# Patient Record
Sex: Female | Born: 1950 | Race: White | Hispanic: No | Marital: Married | Smoking: Never smoker
Health system: Southern US, Community
[De-identification: ages and names within clinical notes are randomized; demographics above are authoritative.]

## PROBLEM LIST (undated history)

## (undated) DIAGNOSIS — E669 Obesity, unspecified: Secondary | ICD-10-CM

## (undated) DIAGNOSIS — R739 Hyperglycemia, unspecified: Secondary | ICD-10-CM

## (undated) DIAGNOSIS — J309 Allergic rhinitis, unspecified: Secondary | ICD-10-CM

## (undated) DIAGNOSIS — E785 Hyperlipidemia, unspecified: Secondary | ICD-10-CM

## (undated) DIAGNOSIS — F329 Major depressive disorder, single episode, unspecified: Secondary | ICD-10-CM

## (undated) DIAGNOSIS — D649 Anemia, unspecified: Secondary | ICD-10-CM

## (undated) DIAGNOSIS — M8580 Other specified disorders of bone density and structure, unspecified site: Secondary | ICD-10-CM

## (undated) DIAGNOSIS — F32A Depression, unspecified: Secondary | ICD-10-CM

## (undated) DIAGNOSIS — G4700 Insomnia, unspecified: Secondary | ICD-10-CM

## (undated) DIAGNOSIS — F419 Anxiety disorder, unspecified: Secondary | ICD-10-CM

## (undated) DIAGNOSIS — T7840XA Allergy, unspecified, initial encounter: Secondary | ICD-10-CM

## (undated) DIAGNOSIS — I10 Essential (primary) hypertension: Secondary | ICD-10-CM

## (undated) DIAGNOSIS — G40909 Epilepsy, unspecified, not intractable, without status epilepticus: Secondary | ICD-10-CM

## (undated) DIAGNOSIS — K210 Gastro-esophageal reflux disease with esophagitis: Secondary | ICD-10-CM

## (undated) DIAGNOSIS — K219 Gastro-esophageal reflux disease without esophagitis: Secondary | ICD-10-CM

## (undated) DIAGNOSIS — Z8742 Personal history of other diseases of the female genital tract: Secondary | ICD-10-CM

## (undated) DIAGNOSIS — E039 Hypothyroidism, unspecified: Secondary | ICD-10-CM

## (undated) HISTORY — DX: Depression, unspecified: F32.A

## (undated) HISTORY — DX: Anemia, unspecified: D64.9

## (undated) HISTORY — DX: Gastro-esophageal reflux disease with esophagitis: K21.0

## (undated) HISTORY — DX: Epilepsy, unspecified, not intractable, without status epilepticus: G40.909

## (undated) HISTORY — DX: Hyperlipidemia, unspecified: E78.5

## (undated) HISTORY — PX: OTHER SURGICAL HISTORY: SHX169

## (undated) HISTORY — DX: Allergic rhinitis, unspecified: J30.9

## (undated) HISTORY — DX: Anxiety disorder, unspecified: F41.9

## (undated) HISTORY — DX: Essential (primary) hypertension: I10

## (undated) HISTORY — DX: Obesity, unspecified: E66.9

## (undated) HISTORY — DX: Hyperglycemia, unspecified: R73.9

## (undated) HISTORY — DX: Other specified disorders of bone density and structure, unspecified site: M85.80

## (undated) HISTORY — DX: Hypothyroidism, unspecified: E03.9

## (undated) HISTORY — DX: Allergy, unspecified, initial encounter: T78.40XA

## (undated) HISTORY — DX: Major depressive disorder, single episode, unspecified: F32.9

## (undated) HISTORY — DX: Insomnia, unspecified: G47.00

## (undated) HISTORY — DX: Personal history of other diseases of the female genital tract: Z87.42

---
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
## 1979-02-02 DIAGNOSIS — Z8742 Personal history of other diseases of the female genital tract: Secondary | ICD-10-CM

## 1979-02-02 HISTORY — DX: Personal history of other diseases of the female genital tract: Z87.42

## 2005-07-26 ENCOUNTER — Ambulatory Visit: Payer: Self-pay | Admitting: Gastroenterology

## 2008-10-05 ENCOUNTER — Emergency Department: Payer: Self-pay | Admitting: Emergency Medicine

## 2012-06-03 DIAGNOSIS — G40909 Epilepsy, unspecified, not intractable, without status epilepticus: Secondary | ICD-10-CM

## 2012-06-03 HISTORY — DX: Epilepsy, unspecified, not intractable, without status epilepticus: G40.909

## 2012-12-11 ENCOUNTER — Ambulatory Visit: Payer: Self-pay | Admitting: Family Medicine

## 2013-09-21 DIAGNOSIS — R569 Unspecified convulsions: Secondary | ICD-10-CM

## 2013-11-30 DIAGNOSIS — E785 Hyperlipidemia, unspecified: Secondary | ICD-10-CM | POA: Insufficient documentation

## 2013-11-30 DIAGNOSIS — E538 Deficiency of other specified B group vitamins: Secondary | ICD-10-CM | POA: Insufficient documentation

## 2013-11-30 DIAGNOSIS — I10 Essential (primary) hypertension: Secondary | ICD-10-CM | POA: Insufficient documentation

## 2013-11-30 DIAGNOSIS — E611 Iron deficiency: Secondary | ICD-10-CM | POA: Insufficient documentation

## 2013-11-30 DIAGNOSIS — E039 Hypothyroidism, unspecified: Secondary | ICD-10-CM | POA: Insufficient documentation

## 2014-02-01 HISTORY — PX: COLONOSCOPY: SHX174

## 2014-02-14 ENCOUNTER — Ambulatory Visit: Payer: Self-pay | Admitting: Gastroenterology

## 2014-12-19 ENCOUNTER — Other Ambulatory Visit: Payer: Self-pay

## 2014-12-19 DIAGNOSIS — E039 Hypothyroidism, unspecified: Secondary | ICD-10-CM

## 2014-12-20 ENCOUNTER — Telehealth: Payer: Self-pay | Admitting: Family Medicine

## 2014-12-20 LAB — T3: T3 TOTAL: 123 ng/dL (ref 71–180)

## 2014-12-20 LAB — TSH: TSH: 0.035 u[IU]/mL — AB (ref 0.450–4.500)

## 2014-12-20 LAB — T4: T4 TOTAL: 14.2 ug/dL — AB (ref 4.5–12.0)

## 2014-12-20 MED ORDER — LEVOTHYROXINE SODIUM 75 MCG PO TABS: 75.0000 ug | ORAL_TABLET | Freq: Every day | ORAL | Status: DC

## 2014-12-20 NOTE — Telephone Encounter (Signed)
Patient notified, appointment scheduled for Thursday afternoon.

## 2014-12-20 NOTE — Telephone Encounter (Signed)
Please let the patient know that her thyroid is still off I'd like to see her in the office Please work her in tomorrow or Thursday In the meantime, we'll drop her dose again (stop current Rx and start new lower dose), but we'll talk about the plan, getting a scan, and getting her referred for this

## 2014-12-21 DIAGNOSIS — E785 Hyperlipidemia, unspecified: Secondary | ICD-10-CM | POA: Insufficient documentation

## 2014-12-21 DIAGNOSIS — R739 Hyperglycemia, unspecified: Secondary | ICD-10-CM | POA: Insufficient documentation

## 2014-12-21 DIAGNOSIS — G4700 Insomnia, unspecified: Secondary | ICD-10-CM | POA: Insufficient documentation

## 2014-12-21 DIAGNOSIS — I10 Essential (primary) hypertension: Secondary | ICD-10-CM | POA: Insufficient documentation

## 2014-12-21 DIAGNOSIS — F419 Anxiety disorder, unspecified: Secondary | ICD-10-CM | POA: Insufficient documentation

## 2014-12-21 DIAGNOSIS — F329 Major depressive disorder, single episode, unspecified: Secondary | ICD-10-CM | POA: Insufficient documentation

## 2014-12-21 DIAGNOSIS — E039 Hypothyroidism, unspecified: Secondary | ICD-10-CM | POA: Insufficient documentation

## 2014-12-21 DIAGNOSIS — F320 Major depressive disorder, single episode, mild: Secondary | ICD-10-CM | POA: Insufficient documentation

## 2014-12-21 DIAGNOSIS — M8580 Other specified disorders of bone density and structure, unspecified site: Secondary | ICD-10-CM | POA: Insufficient documentation

## 2014-12-21 DIAGNOSIS — G40909 Epilepsy, unspecified, not intractable, without status epilepticus: Secondary | ICD-10-CM | POA: Insufficient documentation

## 2014-12-21 DIAGNOSIS — J309 Allergic rhinitis, unspecified: Secondary | ICD-10-CM | POA: Insufficient documentation

## 2014-12-22 ENCOUNTER — Encounter: Payer: Self-pay | Admitting: Family Medicine

## 2014-12-22 ENCOUNTER — Ambulatory Visit (INDEPENDENT_AMBULATORY_CARE_PROVIDER_SITE_OTHER): Payer: PPO | Admitting: Family Medicine

## 2014-12-22 VITALS — BP 121/75 | HR 90 | Temp 99.1°F | Wt 167.0 lb

## 2014-12-22 DIAGNOSIS — R739 Hyperglycemia, unspecified: Secondary | ICD-10-CM

## 2014-12-22 DIAGNOSIS — E038 Other specified hypothyroidism: Secondary | ICD-10-CM

## 2014-12-22 DIAGNOSIS — G40802 Other epilepsy, not intractable, without status epilepticus: Secondary | ICD-10-CM | POA: Diagnosis not present

## 2014-12-22 DIAGNOSIS — L200 Besnier's prurigo: Secondary | ICD-10-CM

## 2014-12-22 DIAGNOSIS — E785 Hyperlipidemia, unspecified: Secondary | ICD-10-CM | POA: Diagnosis not present

## 2014-12-22 DIAGNOSIS — L239 Allergic contact dermatitis, unspecified cause: Secondary | ICD-10-CM | POA: Insufficient documentation

## 2014-12-22 MED ORDER — HYDROXYZINE HCL 25 MG PO TABS: 25.0000 mg | ORAL_TABLET | Freq: Three times a day (TID) | ORAL | Status: DC | PRN

## 2014-12-22 MED ORDER — TRIAMCINOLONE ACETONIDE 0.5 % EX CREA: 1.0000 "application " | TOPICAL_CREAM | Freq: Three times a day (TID) | CUTANEOUS | Status: DC

## 2014-12-22 NOTE — Progress Notes (Signed)
BP 121/75 mmHg  Pulse 90  Temp(Src) 99.1 F (37.3 C)  Wt 167 lb (75.751 kg)  SpO2 100%   Subjective:    Patient ID: Rachel Cox, female    DOB: 01-13-1951, 64 y.o.   MRN: [id number]  HPI: Rachel Cox is a 64 y.o. female  Chief Complaint  Patient presents with  . Hypothyroidism    Discuss Labs and Treatment   She has had a rash for a week; scratching herself to death she says; not working in yard; has a yorkie who goes outside; no fleas; two on the left forearm, one on the right elbow, one on right hand; not sure what they're from; they come and stay; she has tried gold bond, epsom salts, baking soda, cortisone cream; it has calmed down a little bit; she tried Xanax, then said Zyrtec; that helped a little bit, not much  She is here for abnormal thyroid tests; she has not lost weight; heart rate today is 90; no rapid heart rate or fluttering; no loose stools; no heat intolerance, rather she is more cold intolerant; irritable, short-fused over the last several months, "pretty good while"  She thinks she has not had the seizure medicine checked; never had any blood work and she's worried about it; doesn't go back to see him until August 31st  We reviewed her [facility] chart and recent labs over the last year  Relevant past medical, surgical, family and social history reviewed and updated as indicated. Interim medical history since our last visit reviewed. Allergies and medications reviewed and updated.  Review of Systems  Constitutional: Negative for unexpected weight change.  Endocrine: Positive for cold intolerance.  Neurological: Positive for seizures (no more seizures on the Keppra; sees neurologist). Negative for tremors.  Psychiatric/Behavioral:       Short-fused   Per HPI unless specifically indicated above     Objective:    BP 121/75 mmHg  Pulse 90  Temp(Src) 99.1 F (37.3 C)  Wt 167 lb (75.751 kg)  SpO2 100%  Wt Readings from Last 3 Encounters:   12/22/14 167 lb (75.751 kg)  08/19/14 165 lb (74.844 kg)    Physical Exam  Constitutional: She appears well-developed and well-nourished. No distress.  HENT:  Head: Normocephalic and atraumatic.  Eyes: EOM are normal. No scleral icterus.  No proptosis  Neck: No thyromegaly present.  Cardiovascular: Normal rate, regular rhythm and normal heart sounds.   No murmur heard. Pulmonary/Chest: Effort normal and breath sounds normal. No respiratory distress. She has no wheezes.  Abdominal: Soft. Bowel sounds are normal. She exhibits no distension.  Musculoskeletal: Normal range of motion. She exhibits no edema.  Neurological: She is alert. She displays no tremor. She exhibits normal muscle tone.  Reflex Scores:      Patellar reflexes are 1+ on the right side and 1+ on the left side. Skin: Skin is warm and dry. Rash noted. Rash is papular. She is not diaphoretic. No pallor.  Few (five) erythematous papular lesions on the arms; no particular dermatomal distribution; no vesicles; blanchable  Psychiatric: She has a normal mood and affect. Her speech is normal and behavior is normal. Judgment and thought content normal. Cognition and memory are normal.    Results for orders placed or performed in visit on 12/19/14  TSH  Result Value Ref Range   TSH 0.035 (L) 0.450 - 4.500 uIU/mL  T4  Result Value Ref Range   T4, Total 14.2 (H) 4.5 - 12.0 ug/dL  T3  Result Value Ref Range   T3, Total 123 71 - 180 ng/dL      Assessment & Plan:   Problem List Items Addressed This Visit      Endocrine   Hypothyroidism    Reviewed recent labs; this is unusual that she has gone down several doses in thyroid replacement; could have overactive (hot) nodule; will get [place] and then uptake scan if needed; refer to endo or ENT as indicated        Nervous and Auditory   Epilepsy    Patient requested keppra level and we'll check and send to neurologist      Relevant Orders   Levetiracetam level      Musculoskeletal and Integument   Allergic dermatitis - Primary    Suspect allergic contact dermatitis; use topical corticosteroid and hydroxyzine for itching; if condition persists, then further work-up and allergy testing        Other   Hyperglycemia    Check A1C      Relevant Orders   Hgb A1c w/o eAG   Hyperlipidemia    Limit eggs to no more than 3 per week; limit saturated fats; check lipids; continue statin      Relevant Orders   Lipid Panel w/o Chol/HDL Ratio       Follow up plan: No Follow-up on file.

## 2014-12-22 NOTE — Assessment & Plan Note (Signed)
Patient requested keppra level and we'll check and send to neurologist

## 2014-12-22 NOTE — Assessment & Plan Note (Signed)
Check A1C 

## 2014-12-22 NOTE — Assessment & Plan Note (Signed)
Limit eggs to no more than 3 per week; limit saturated fats; check lipids; continue statin

## 2014-12-22 NOTE — Assessment & Plan Note (Signed)
Suspect allergic contact dermatitis; use topical corticosteroid and hydroxyzine for itching; if condition persists, then further work-up and allergy testing

## 2014-12-22 NOTE — Assessment & Plan Note (Addendum)
Reviewed recent labs; this is unusual that she has gone down several doses in thyroid replacement; could have overactive (hot) nodule; will get [place] and then uptake scan if needed; refer to endo or ENT as indicated

## 2014-12-22 NOTE — Patient Instructions (Signed)
We'll get an ultrasound of your thyroid Continue the lower dose of the thyroid medicine, recheck thyroid labs in 8 weeks or so Try the new cream for the rash Use the anti-itch pills if needed Don't drive after taking the pills if they make you sleepy Limit eggs to no more than three per week Limit saturated fats (bologna, bacon, sausage, hot dogs, etc.) Return in September and we'll get labs then (don't need to come fasting for that)

## 2014-12-23 LAB — LIPID PANEL W/O CHOL/HDL RATIO
CHOLESTEROL TOTAL: 171 mg/dL (ref 100–199)
HDL: 92 mg/dL (ref >39)
LDL CALC: 66 mg/dL (ref 0–99)
Triglycerides: 67 mg/dL (ref 0–149)
VLDL CHOLESTEROL CAL: 13 mg/dL (ref 5–40)

## 2014-12-23 LAB — HGB A1C W/O EAG: Hgb A1c MFr Bld: 5.8 % — ABNORMAL HIGH (ref 4.8–5.6)

## 2014-12-25 ENCOUNTER — Encounter: Payer: Self-pay | Admitting: Family Medicine

## 2014-12-25 LAB — LEVETIRACETAM LEVEL: Levetiracetam Lvl: 25 ug/mL (ref 10.0–40.0)

## 2014-12-27 ENCOUNTER — Ambulatory Visit
Admission: RE | Admit: 2014-12-27 | Discharge: 2014-12-27 | Disposition: A | Discharge status: Home / Self Care | Payer: PPO | Source: Ambulatory Visit | Attending: Family Medicine | Admitting: Family Medicine

## 2014-12-27 ENCOUNTER — Other Ambulatory Visit: Payer: Self-pay | Admitting: Family Medicine

## 2014-12-27 DIAGNOSIS — E041 Nontoxic single thyroid nodule: Secondary | ICD-10-CM | POA: Insufficient documentation

## 2014-12-27 DIAGNOSIS — E038 Other specified hypothyroidism: Secondary | ICD-10-CM

## 2014-12-27 DIAGNOSIS — E0590 Thyrotoxicosis, unspecified without thyrotoxic crisis or storm: Secondary | ICD-10-CM

## 2015-01-14 ENCOUNTER — Other Ambulatory Visit: Payer: Self-pay | Admitting: Family Medicine

## 2015-01-20 ENCOUNTER — Ambulatory Visit (INDEPENDENT_AMBULATORY_CARE_PROVIDER_SITE_OTHER): Payer: PPO | Admitting: Endocrinology

## 2015-01-20 ENCOUNTER — Encounter: Payer: Self-pay | Admitting: Endocrinology

## 2015-01-20 VITALS — BP 130/70 | HR 87 | Temp 98.9°F | Ht 65.5 in | Wt 161.0 lb

## 2015-01-20 DIAGNOSIS — E038 Other specified hypothyroidism: Secondary | ICD-10-CM | POA: Diagnosis not present

## 2015-01-20 NOTE — Patient Instructions (Addendum)
You seem to need less thyroid medication than you used to (or maybe none at all now).  This may be due to your having 2 different types of thyroid problems--chronic inflammation which causes a low thyroid, and lumps, which can cause a high thyroid.  We'll have to follow your blood tests to tell.   For now, please stop taking the thyroid pill, and recheck the blood tests in 1 month.   Pt says she wants to do this at Dr Lesly Dukes with me.  Please send results here. Please come back for a follow-up appointment in 6 months.

## 2015-01-20 NOTE — Progress Notes (Signed)
Subjective:    Patient ID: Rachel Cox, female    DOB: 1950-11-24, 64 y.o.   MRN: [id number]  HPI Pt reports hypothyroidism was dx'ed in 2008.  she has never been on prescribed thyroid hormone therapy since then.  He has never taken kelp or any other type of non-prescribed thyroid product.  He has never had thyroid surgery, or XRT to the neck.  He has never been on amiodarone or lithium.  She was on synthroid 150 mcg/day at one point, but she has been on her current 75/day, x 2 months.  She has slight cramps in the legs, and assoc fatigue.   Past Medical History  Diagnosis Date  . Allergy   . Hyperglycemia   . Hyperlipidemia   . Hypertension   . Hypothyroidism   . Insomnia   . Depression   . Anxiety   . Epilepsy     managed by Dr. Melrose Nakayama  . Anemia   . Osteopenia   . History of abnormal cervical Pap smear 1980's    Past Surgical History  Procedure Laterality Date  . Tubal ligation  1969  . Colonoscopy  Sept 2015  . Cryo procedure  1980s    for abnormal pap    Social History   Social History  . Marital Status: Married    Spouse Name: N/A  . Number of Children: N/A  . Years of Education: N/A   Occupational History  . Not on file.   Social History Main Topics  . Smoking status: Never Smoker   . Smokeless tobacco: Never Used  . Alcohol Use: No  . Drug Use: No  . Sexual Activity: Not on file   Other Topics Concern  . Not on file   Social History Narrative    Current Outpatient Prescriptions on File Prior to Visit  Medication Sig Dispense Refill  . aspirin EC 81 MG tablet Take 81 mg by mouth daily.    . Cyanocobalamin (VITAMIN B-12 PO) Take 400 mcg by mouth daily.     . fluticasone (FLONASE) 50 MCG/ACT nasal spray Place 2 sprays into both nostrils daily.    . folic acid (FOLVITE) 462 MCG tablet Take 400 mcg by mouth daily.    Marland Kitchen levETIRAcetam (KEPPRA) 500 MG tablet Take 500 mg by mouth 2 (two) times daily.    Marland Kitchen losartan-hydrochlorothiazide (HYZAAR) 100-25 MG  per tablet Take 1 tablet by mouth daily.    . ranitidine (ZANTAC) 150 MG tablet Take 150 mg by mouth at bedtime.    . simvastatin (ZOCOR) 40 MG tablet Take 40 mg by mouth at bedtime.    . hydrOXYzine (ATARAX/VISTARIL) 25 MG tablet Take 1 tablet (25 mg total) by mouth 3 (three) times daily as needed for itching. (Patient not taking: Reported on 01/20/2015) 30 tablet 0  . triamcinolone cream (KENALOG) 0.5 % Apply 1 application topically 3 (three) times daily. Too strong for face, underarms, or groin (Patient not taking: Reported on 01/20/2015) 15 g 0   No current facility-administered medications on file prior to visit.    Allergies  Allergen Reactions  . Aspirin Nausea Only  . Codeine     Family History  Problem Relation Age of Onset  . Hyperlipidemia Mother   . Hypertension Mother   . CAD Mother   . COPD Father   . Cancer Brother     liver  . Alcohol abuse Brother   . Hypertension Brother   . Liver disease Brother   . Thyroid  disease Mother   . Thyroid disease Father   . Thyroid disease Sister     BP 130/70 mmHg  Pulse 87  Temp(Src) 98.9 F (37.2 C) (Oral)  Ht 5' 5.5" (1.664 m)  Wt 161 lb (73.029 kg)  BMI 26.37 kg/m2  SpO2 99%  Review of Systems denies depression, hair loss, sob, constipation, numbness, blurry vision, cold intolerance, myalgias, rhinorrhea, easy bruising, and syncope.  She has lost a few lbs.  She has slightly dry skin.     Objective:   Physical Exam VS: see vs page GEN: no distress HEAD: head: no deformity eyes: no periorbital swelling, no proptosis external nose and ears are normal mouth: no lesion seen NECK: supple, thyroid is not enlarged.  i cannot appreciate the nodule.  CHEST WALL: no deformity LUNGS:  Clear to auscultation.  CV: reg rate and rhythm, no murmur ABD: abdomen is soft, nontender.  no hepatosplenomegaly.  not distended.  no hernia MUSCULOSKELETAL: muscle bulk and strength are grossly normal.  no obvious joint swelling.  gait is  normal and steady.   EXTEMITIES: no deformity.  no ulcer on the feet.  feet are of normal color and temp.  no edema PULSES: dorsalis pedis intact bilat.  no carotid bruit NEURO:  cn 2-12 grossly intact.   readily moves all 4's.  sensation is intact to touch on the feet SKIN:  Normal texture and temperature.  No rash or suspicious lesion is visible.   NODES:  None palpable at the neck PSYCH: alert, well-oriented.  Does not appear anxious nor depressed.   Lab Results  Component Value Date   TSH 0.035* 12/19/2014   T3TOTAL 123 12/19/2014   T4TOTAL 14.2* 12/19/2014   radiol (thyroid US): Nodule at the inferior aspect of the right thyroid measures 1.4 cm x 7 mm x 1.4 cm. No internal calcifications. Well-defined margin.   i personally reviewed electrocardiogram tracing (10/05/08): Possible Left atrial enlargement    Assessment & Plan:  Hypothyroidism: overreplaced.  This raises the possibility that she is evolving endogenous hyperthyroidism.  This is unusual, but it occasionally happens.   Thyroid nodule: all we need to do for now is to follow.   Patient is advised the following: Patient Instructions  You seem to need less thyroid medication than you used to (or maybe none at all now).  This may be due to your having 2 different types of thyroid problems--chronic inflammation which causes a low thyroid, and lumps, which can cause a high thyroid.  We'll have to follow your blood tests to tell.   For now, please stop taking the thyroid pill, and recheck the blood tests in 1 month.   Pt says she wants to do this at Dr Lesly Dukes with me.  Please send results here. Please come back for a follow-up appointment in 6 months.

## 2015-02-20 ENCOUNTER — Ambulatory Visit: Payer: Self-pay | Admitting: Family Medicine

## 2015-03-02 ENCOUNTER — Encounter: Payer: Self-pay | Admitting: Family Medicine

## 2015-03-02 ENCOUNTER — Ambulatory Visit (INDEPENDENT_AMBULATORY_CARE_PROVIDER_SITE_OTHER): Payer: PPO | Admitting: Family Medicine

## 2015-03-02 VITALS — BP 152/79 | HR 84 | Temp 97.3°F | Wt 163.0 lb

## 2015-03-02 DIAGNOSIS — Z5181 Encounter for therapeutic drug level monitoring: Secondary | ICD-10-CM

## 2015-03-02 DIAGNOSIS — E039 Hypothyroidism, unspecified: Secondary | ICD-10-CM

## 2015-03-02 DIAGNOSIS — E041 Nontoxic single thyroid nodule: Secondary | ICD-10-CM

## 2015-03-02 DIAGNOSIS — R809 Proteinuria, unspecified: Secondary | ICD-10-CM | POA: Insufficient documentation

## 2015-03-02 DIAGNOSIS — Z23 Encounter for immunization: Secondary | ICD-10-CM | POA: Diagnosis not present

## 2015-03-02 DIAGNOSIS — E559 Vitamin D deficiency, unspecified: Secondary | ICD-10-CM | POA: Diagnosis not present

## 2015-03-02 DIAGNOSIS — R252 Cramp and spasm: Secondary | ICD-10-CM | POA: Diagnosis not present

## 2015-03-02 DIAGNOSIS — E785 Hyperlipidemia, unspecified: Secondary | ICD-10-CM

## 2015-03-02 DIAGNOSIS — M8580 Other specified disorders of bone density and structure, unspecified site: Secondary | ICD-10-CM

## 2015-03-02 DIAGNOSIS — I10 Essential (primary) hypertension: Secondary | ICD-10-CM

## 2015-03-02 MED ORDER — SIMVASTATIN 40 MG PO TABS: 40.0000 mg | ORAL_TABLET | Freq: Every day | ORAL | Status: DC

## 2015-03-02 NOTE — Assessment & Plan Note (Signed)
Last level Oct 19, 2014 was 26.9; continue supplementation

## 2015-03-02 NOTE — Assessment & Plan Note (Signed)
Check TSH and free T4 and send results to endocrinologist

## 2015-03-02 NOTE — Assessment & Plan Note (Signed)
Bone density study November 29, 2013 reviewed; next due December 01, 2015 or just after; fall precautions; three servings of calcium a day

## 2015-03-02 NOTE — Assessment & Plan Note (Signed)
Encouraged patient to cut down on saturated fats; refilled the statin; reviewed last lipids; check SGPT today

## 2015-03-02 NOTE — Progress Notes (Signed)
BP 152/79 mmHg  Pulse 84  Temp(Src) 97.3 F (36.3 C)  Wt 163 lb (73.936 kg)  SpO2 100%   Subjective:    Patient ID: Rachel Cox, female    DOB: Oct 31, 1950, 64 y.o.   MRN: [id number]  HPI: Rachel Cox is a 64 y.o. female  Chief Complaint  Patient presents with  . Hypothyroidism    She was told by her Endocrinologist to stop her thyroid med.   Patient is here for follow-up  She has been seeing an endocrinologist in [place]; she stopped her medicine on the 19th of August; I reviewed the note from Dr. Loanne Drilling in [place]; she saw him on August 19th; hypothyroidism dxd in 2008; no hx of amiodarone or lithium, no surgery, no radiation treatment; there is a positive family hx of thyroid problems (mother and sister); the fatigue has improved, but still has some leg cramps, much better; an [place] was done recently, one solitary nodule on the right; endo says to follow the nodule for now; he wanted labs done one month later and we'll send those to him to save her a trip to [facility]; no problems with constipation; no problems with hair loss  She has high blood pressure; she just took her BP pill before coming; she had a rough night last night with weather and storms  She has high cholesterol; chronic issue; not really watching diet too closely; reviewed cholesterol from July and it was excellent; no problems with medicines, no abd pain and no muscle aches  She has prediabetes; last A1C was excellent, 5.8 in July  She has not had a seizure since 2010; goes back to see neurologist next month  She wants a flu shot today  Relevant past medical, surgical, family and social history reviewed and updated as indicated. Interim medical history since our last visit reviewed. Allergies and medications reviewed and updated.  Review of Systems  Constitutional: Negative for unexpected weight change.  Respiratory: Negative for shortness of breath.   Cardiovascular: Negative for chest pain.   Musculoskeletal:       Some leg cramps, better than before  Per HPI unless specifically indicated above     Objective:    BP 152/79 mmHg  Pulse 84  Temp(Src) 97.3 F (36.3 C)  Wt 163 lb (73.936 kg)  SpO2 100%  Wt Readings from Last 3 Encounters:  03/02/15 163 lb (73.936 kg)  01/20/15 161 lb (73.029 kg)  12/22/14 167 lb (75.751 kg)    Physical Exam  Constitutional: She appears well-developed and well-nourished. No distress.  Weight up two pounds over last five weeks  HENT:  Head: Normocephalic and atraumatic.  Eyes: EOM are normal. No scleral icterus.  Neck: No thyromegaly present.  Cardiovascular: Normal rate, regular rhythm and normal heart sounds.   No murmur heard. Pulmonary/Chest: Effort normal and breath sounds normal. No respiratory distress. She has no wheezes.  Abdominal: Soft. Bowel sounds are normal. She exhibits no distension.  Musculoskeletal: Normal range of motion. She exhibits no edema.  Neurological: She is alert. She exhibits normal muscle tone.  Skin: Skin is warm and dry. She is not diaphoretic. No pallor.  Psychiatric: She has a normal mood and affect. Her behavior is normal. Judgment and thought content normal.      Assessment & Plan:   Problem List Items Addressed This Visit      Cardiovascular and Mediastinum   Hypertension - Primary    Not quite to goal today; try to follow  DASH guidelines; will be checking thyroid tests to see if hyperthyroidism playing a role in this      Relevant Medications   simvastatin (ZOCOR) 40 MG tablet     Endocrine   Hypothyroidism    Check TSH and free T4 and send results to endocrinologist      Relevant Orders   TSH (Completed)   T4, free (Completed)   Thyroid nodule    Discovered on [place]; she is seeing endocrinologist      Relevant Orders   TSH (Completed)   T4, free (Completed)     Musculoskeletal and Integument   Osteopenia    Bone density study November 29, 2013 reviewed; next due December 01, 2015 or  just after; fall precautions; three servings of calcium a day        Other   Hyperlipidemia    Encouraged patient to cut down on saturated fats; refilled the statin; reviewed last lipids; check SGPT today      Relevant Medications   simvastatin (ZOCOR) 40 MG tablet   Leg cramps   Relevant Orders   Magnesium (Completed)   Vitamin D deficiency    Last level Oct 19, 2014 was 26.9; continue supplementation      Microalbuminuria    On max dose ARB; continue      Relevant Orders   Microalbumin / creatinine urine ratio (Completed)   Medication monitoring encounter   Relevant Orders   Comprehensive metabolic panel (Completed)    Other Visit Diagnoses    Encounter for immunization        flu vaccine offered, given today       Follow up plan: Return in about 4 months (around 06/26/2015) for visit and fasting labs.  An after-visit summary was printed and given to the patient at [facility].  Please see the patient instructions which may contain other information and recommendations beyond what is mentioned above in the assessment and plan.  Orders Placed This Encounter  Procedures  . Flu Vaccine QUAD 36+ mos IM  . TSH  . T4, free  . Magnesium  . Comprehensive metabolic panel  . Microalbumin / creatinine urine ratio   Meds ordered this encounter  Medications  . simvastatin (ZOCOR) 40 MG tablet    Sig: Take 1 tablet (40 mg total) by mouth at bedtime.    Dispense:  90 tablet    Refill:  1

## 2015-03-02 NOTE — Assessment & Plan Note (Signed)
Not quite to goal today; try to follow DASH guidelines; will be checking thyroid tests to see if hyperthyroidism playing a role in this

## 2015-03-02 NOTE — Assessment & Plan Note (Signed)
Discovered on [place]; she is seeing endocrinologist

## 2015-03-02 NOTE — Patient Instructions (Addendum)
You received the flu shot today; it should protect you against the flu virus over the coming months; it will take about two weeks for antibodies to develop; do try to stay away from hospitals, nursing homes, and daycares during peak flu season; taking 1000 mg of vitamin C daily during flu season may help you avoid getting sick We'll check labs today and let your doctor know the results The next time I need to see you for labs will be January 22nd or just AFTER, so do come fasting for your next visit Try to follow DASH guidelines to help lower your blood pressure Monitor your blood pressure a few times a month and call me if the top number is not under 150

## 2015-03-03 ENCOUNTER — Other Ambulatory Visit: Payer: Self-pay | Admitting: Family Medicine

## 2015-03-03 LAB — COMPREHENSIVE METABOLIC PANEL
ALBUMIN: 4.1 g/dL (ref 3.6–4.8)
ALK PHOS: 72 IU/L (ref 39–117)
ALT: 17 IU/L (ref 0–32)
AST: 22 IU/L (ref 0–40)
Albumin/Globulin Ratio: 1.7 (ref 1.1–2.5)
BUN / CREAT RATIO: 13 (ref 11–26)
BUN: 13 mg/dL (ref 8–27)
Bilirubin Total: 0.4 mg/dL (ref 0.0–1.2)
CHLORIDE: 100 mmol/L (ref 97–108)
CO2: 24 mmol/L (ref 18–29)
Calcium: 9.9 mg/dL (ref 8.7–10.3)
Creatinine, Ser: 1.02 mg/dL — ABNORMAL HIGH (ref 0.57–1.00)
GFR calc Af Amer: 66 mL/min/{1.73_m2} (ref >59)
GFR calc non Af Amer: 57 mL/min/{1.73_m2} — ABNORMAL LOW (ref >59)
GLUCOSE: 92 mg/dL (ref 65–99)
Globulin, Total: 2.4 g/dL (ref 1.5–4.5)
Potassium: 4.3 mmol/L (ref 3.5–5.2)
Sodium: 140 mmol/L (ref 134–144)
Total Protein: 6.5 g/dL (ref 6.0–8.5)

## 2015-03-03 LAB — TSH: TSH: 62.43 u[IU]/mL — AB (ref 0.450–4.500)

## 2015-03-03 LAB — T4, FREE: Free T4: 0.69 ng/dL — ABNORMAL LOW (ref 0.82–1.77)

## 2015-03-03 LAB — MAGNESIUM: MAGNESIUM: 2.1 mg/dL (ref 1.6–2.3)

## 2015-03-03 LAB — MICROALBUMIN / CREATININE URINE RATIO
Creatinine, Urine: 106.7 mg/dL
MICROALB/CREAT RATIO: 6.7 mg/g creat (ref 0.0–30.0)
Microalbumin, Urine: 7.2 ug/mL

## 2015-03-03 NOTE — Telephone Encounter (Signed)
Routing to provider  

## 2015-03-06 ENCOUNTER — Telehealth: Payer: Self-pay | Admitting: Endocrinology

## 2015-03-06 NOTE — Telephone Encounter (Signed)
Patient contacted and advised of note below. Pt voiced good understanding.

## 2015-03-06 NOTE — Assessment & Plan Note (Signed)
On max dose ARB; continue

## 2015-03-06 NOTE — Telephone Encounter (Signed)
Please let patient know that her thyroid tests are off and she should contact her endocrinologist about what to do with her medicine Kidney function stable, liver function normal; sugar is normal I sent the Rx for BP med

## 2015-03-06 NOTE — Telephone Encounter (Signed)
Patient called requesting her labs from Dr. Sanda Klein on 03/03/2015 to be reviewed. Please advised on lab work. Thanks!

## 2015-03-06 NOTE — Telephone Encounter (Signed)
Patient is calling for the results of her labs work

## 2015-03-06 NOTE — Telephone Encounter (Signed)
Please resume levothyroxine, 75 mcg/d Please come back for a follow-up appointment in 1 month.

## 2015-03-07 ENCOUNTER — Other Ambulatory Visit: Payer: Self-pay

## 2015-03-07 ENCOUNTER — Telehealth: Payer: Self-pay | Admitting: Endocrinology

## 2015-03-07 MED ORDER — LEVOTHYROXINE SODIUM 75 MCG PO TABS: 75.0000 ug | ORAL_TABLET | Freq: Every day | ORAL | Status: DC

## 2015-03-07 NOTE — Telephone Encounter (Signed)
I contacted the pt and notified rx for her thyroid med has been sent. Pt voiced understanding.

## 2015-03-07 NOTE — Telephone Encounter (Signed)
Patient went to her pharmacy and prescription wasn't there, wasn't sure of the name of the medication, please advise

## 2015-04-09 ENCOUNTER — Emergency Department: Payer: PPO

## 2015-04-09 ENCOUNTER — Inpatient Hospital Stay
Admission: EM | Admit: 2015-04-09 | Discharge: 2015-04-11 | DRG: 101 | Disposition: A | Discharge status: Home / Self Care | Payer: PPO | Attending: Internal Medicine | Admitting: Internal Medicine

## 2015-04-09 DIAGNOSIS — G40909 Epilepsy, unspecified, not intractable, without status epilepticus: Principal | ICD-10-CM | POA: Diagnosis present

## 2015-04-09 DIAGNOSIS — Z825 Family history of asthma and other chronic lower respiratory diseases: Secondary | ICD-10-CM | POA: Diagnosis not present

## 2015-04-09 DIAGNOSIS — Z8249 Family history of ischemic heart disease and other diseases of the circulatory system: Secondary | ICD-10-CM

## 2015-04-09 DIAGNOSIS — I10 Essential (primary) hypertension: Secondary | ICD-10-CM | POA: Diagnosis present

## 2015-04-09 DIAGNOSIS — E039 Hypothyroidism, unspecified: Secondary | ICD-10-CM | POA: Diagnosis present

## 2015-04-09 DIAGNOSIS — M549 Dorsalgia, unspecified: Secondary | ICD-10-CM | POA: Diagnosis present

## 2015-04-09 DIAGNOSIS — D509 Iron deficiency anemia, unspecified: Secondary | ICD-10-CM | POA: Diagnosis present

## 2015-04-09 DIAGNOSIS — R569 Unspecified convulsions: Secondary | ICD-10-CM | POA: Diagnosis present

## 2015-04-09 DIAGNOSIS — E785 Hyperlipidemia, unspecified: Secondary | ICD-10-CM | POA: Diagnosis present

## 2015-04-09 DIAGNOSIS — D649 Anemia, unspecified: Secondary | ICD-10-CM | POA: Diagnosis present

## 2015-04-09 LAB — URINALYSIS COMPLETE WITH MICROSCOPIC (ARMC ONLY)
Bilirubin Urine: NEGATIVE
Glucose, UA: NEGATIVE mg/dL
Hgb urine dipstick: NEGATIVE
Ketones, ur: NEGATIVE mg/dL
Leukocytes, UA: NEGATIVE
Nitrite: NEGATIVE
Protein, ur: 30 mg/dL — AB
Specific Gravity, Urine: 1.012 (ref 1.005–1.030)
pH: 5 (ref 5.0–8.0)

## 2015-04-09 LAB — CBC WITH DIFFERENTIAL/PLATELET
Basophils Absolute: 0.1 10*3/uL (ref 0–0.1)
Basophils Relative: 1 %
EOS ABS: 0.2 10*3/uL (ref 0–0.7)
EOS PCT: 3 %
HCT: 25.1 % — ABNORMAL LOW (ref 35.0–47.0)
HEMOGLOBIN: 7.4 g/dL — AB (ref 12.0–16.0)
Lymphocytes Relative: 30 %
Lymphs Abs: 2.1 10*3/uL (ref 1.0–3.6)
MCH: 19.6 pg — AB (ref 26.0–34.0)
MCHC: 29.7 g/dL — ABNORMAL LOW (ref 32.0–36.0)
MCV: 65.9 fL — ABNORMAL LOW (ref 80.0–100.0)
MONOS PCT: 11 %
Monocytes Absolute: 0.8 10*3/uL (ref 0.2–0.9)
Neutro Abs: 3.8 10*3/uL (ref 1.4–6.5)
Neutrophils Relative %: 55 %
Platelets: 395 10*3/uL (ref 150–440)
RBC: 3.81 MIL/uL (ref 3.80–5.20)
RDW: 18 % — ABNORMAL HIGH (ref 11.5–14.5)
WBC: 6.9 10*3/uL (ref 3.6–11.0)

## 2015-04-09 LAB — COMPREHENSIVE METABOLIC PANEL
ALK PHOS: 63 U/L (ref 38–126)
ALT: 15 U/L (ref 14–54)
AST: 22 U/L (ref 15–41)
Albumin: 4.1 g/dL (ref 3.5–5.0)
Anion gap: 8 (ref 5–15)
BUN: 14 mg/dL (ref 6–20)
CALCIUM: 9.8 mg/dL (ref 8.9–10.3)
CHLORIDE: 104 mmol/L (ref 101–111)
CO2: 24 mmol/L (ref 22–32)
Creatinine, Ser: 0.98 mg/dL (ref 0.44–1.00)
GFR calc Af Amer: >60 mL/min (ref >60)
GFR calc non Af Amer: 59 mL/min — ABNORMAL LOW (ref >60)
GLUCOSE: 136 mg/dL — AB (ref 65–99)
Potassium: 3.3 mmol/L — ABNORMAL LOW (ref 3.5–5.1)
SODIUM: 136 mmol/L (ref 135–145)
Total Bilirubin: 0.4 mg/dL (ref 0.3–1.2)
Total Protein: 7 g/dL (ref 6.5–8.1)

## 2015-04-09 LAB — URINE DRUG SCREEN, QUALITATIVE (ARMC ONLY)
Amphetamines, Ur Screen: NOT DETECTED
Barbiturates, Ur Screen: NOT DETECTED
Benzodiazepine, Ur Scrn: NOT DETECTED
Cannabinoid 50 Ng, Ur ~~LOC~~: NOT DETECTED
Cocaine Metabolite,Ur ~~LOC~~: NOT DETECTED
MDMA (Ecstasy)Ur Screen: NOT DETECTED
Methadone Scn, Ur: NOT DETECTED
Opiate, Ur Screen: NOT DETECTED
Phencyclidine (PCP) Ur S: NOT DETECTED
Tricyclic, Ur Screen: NOT DETECTED

## 2015-04-09 LAB — ABO/RH: ABO/RH(D): O NEG

## 2015-04-09 LAB — TSH: TSH: 5.028 u[IU]/mL — ABNORMAL HIGH (ref 0.350–4.500)

## 2015-04-09 LAB — PROTIME-INR
INR: 0.89
PROTHROMBIN TIME: 12.3 s (ref 11.4–15.0)

## 2015-04-09 LAB — ETHANOL: Alcohol, Ethyl (B): <5 mg/dL (ref <5)

## 2015-04-09 LAB — TRANSFERRIN: Transferrin: 356 mg/dL (ref 192–382)

## 2015-04-09 LAB — FERRITIN: Ferritin: 4 ng/mL — ABNORMAL LOW (ref 11–307)

## 2015-04-09 LAB — IRON AND TIBC
Iron: 11 ug/dL — ABNORMAL LOW (ref 28–170)
Saturation Ratios: 2 % — ABNORMAL LOW (ref 10.4–31.8)
TIBC: 473 ug/dL — ABNORMAL HIGH (ref 250–450)
UIBC: 463 ug/dL

## 2015-04-09 LAB — PREPARE RBC (CROSSMATCH)

## 2015-04-09 MED ORDER — SODIUM CHLORIDE 0.9 % IV SOLN
10.0000 mL/h | Freq: Once | INTRAVENOUS | Status: AC
  Administered 2015-04-09: 10 mL/h via INTRAVENOUS

## 2015-04-09 MED ORDER — ACETAMINOPHEN 650 MG RE SUPP: 650.0000 mg | Freq: Four times a day (QID) | RECTAL | Status: DC | PRN

## 2015-04-09 MED ORDER — HYDROCHLOROTHIAZIDE 25 MG PO TABS
25.0000 mg | ORAL_TABLET | Freq: Every day | ORAL | Status: DC
  Administered 2015-04-10 – 2015-04-11 (×2): 25 mg via ORAL
  Filled 2015-04-09 (×2): qty 1

## 2015-04-09 MED ORDER — LORAZEPAM 2 MG/ML IJ SOLN
INTRAMUSCULAR | Status: AC
  Administered 2015-04-09: 2 mg via INTRAVENOUS
  Filled 2015-04-09: qty 1

## 2015-04-09 MED ORDER — SODIUM CHLORIDE 0.9 % IV SOLN
1000.0000 mg | Freq: Once | INTRAVENOUS | Status: AC
  Administered 2015-04-09: 1000 mg via INTRAVENOUS
  Filled 2015-04-09: qty 10

## 2015-04-09 MED ORDER — LEVOTHYROXINE SODIUM 75 MCG PO TABS
75.0000 ug | ORAL_TABLET | Freq: Every day | ORAL | Status: DC
  Administered 2015-04-09 – 2015-04-11 (×3): 75 ug via ORAL
  Filled 2015-04-09 (×3): qty 1

## 2015-04-09 MED ORDER — SIMVASTATIN 40 MG PO TABS
40.0000 mg | ORAL_TABLET | Freq: Every day | ORAL | Status: DC
  Administered 2015-04-09 – 2015-04-10 (×2): 40 mg via ORAL
  Filled 2015-04-09 (×2): qty 1

## 2015-04-09 MED ORDER — LORAZEPAM 2 MG/ML IJ SOLN
2.0000 mg | Freq: Once | INTRAMUSCULAR | Status: AC
  Administered 2015-04-09: 2 mg via INTRAVENOUS

## 2015-04-09 MED ORDER — OXYCODONE-ACETAMINOPHEN 5-325 MG PO TABS
1.0000 | ORAL_TABLET | Freq: Four times a day (QID) | ORAL | Status: DC | PRN
Start: 2015-04-09 — End: 2015-04-11

## 2015-04-09 MED ORDER — FOLIC ACID 1 MG PO TABS
500.0000 ug | ORAL_TABLET | Freq: Every day | ORAL | Status: DC
  Administered 2015-04-09 – 2015-04-11 (×3): 0.5 mg via ORAL
  Filled 2015-04-09 (×3): qty 1

## 2015-04-09 MED ORDER — SODIUM CHLORIDE 0.9 % IV SOLN
INTRAVENOUS | Status: DC
  Administered 2015-04-09 – 2015-04-10 (×2): via INTRAVENOUS

## 2015-04-09 MED ORDER — ACETAMINOPHEN 325 MG PO TABS
650.0000 mg | ORAL_TABLET | Freq: Four times a day (QID) | ORAL | Status: DC | PRN
  Administered 2015-04-09 – 2015-04-10 (×3): 650 mg via ORAL
  Filled 2015-04-09 (×3): qty 2

## 2015-04-09 MED ORDER — LOSARTAN POTASSIUM 50 MG PO TABS
100.0000 mg | ORAL_TABLET | Freq: Every day | ORAL | Status: DC
  Administered 2015-04-10 – 2015-04-11 (×2): 100 mg via ORAL
  Filled 2015-04-09 (×2): qty 2

## 2015-04-09 MED ORDER — DOCUSATE SODIUM 100 MG PO CAPS
100.0000 mg | ORAL_CAPSULE | Freq: Two times a day (BID) | ORAL | Status: DC
  Administered 2015-04-09 – 2015-04-11 (×5): 100 mg via ORAL
  Filled 2015-04-09 (×5): qty 1

## 2015-04-09 MED ORDER — MORPHINE SULFATE (PF) 2 MG/ML IV SOLN
1.0000 mg | INTRAVENOUS | Status: DC | PRN
  Administered 2015-04-09: 1 mg via INTRAVENOUS
  Filled 2015-04-09: qty 1

## 2015-04-09 MED ORDER — ONDANSETRON HCL 4 MG/2ML IJ SOLN: 4.0000 mg | Freq: Four times a day (QID) | INTRAMUSCULAR | Status: DC | PRN

## 2015-04-09 MED ORDER — LOSARTAN POTASSIUM-HCTZ 100-25 MG PO TABS: 1.0000 | ORAL_TABLET | Freq: Every day | ORAL | Status: DC

## 2015-04-09 MED ORDER — LEVETIRACETAM 500 MG PO TABS
500.0000 mg | ORAL_TABLET | Freq: Two times a day (BID) | ORAL | Status: DC
  Administered 2015-04-09 – 2015-04-11 (×4): 500 mg via ORAL
  Filled 2015-04-09 (×4): qty 1

## 2015-04-09 MED ORDER — FLUTICASONE PROPIONATE 50 MCG/ACT NA SUSP
2.0000 | Freq: Every day | NASAL | Status: DC
  Administered 2015-04-09 – 2015-04-11 (×3): 2 via NASAL
  Filled 2015-04-09 (×2): qty 16

## 2015-04-09 MED ORDER — HEPARIN SODIUM (PORCINE) 5000 UNIT/ML IJ SOLN
5000.0000 [IU] | Freq: Three times a day (TID) | INTRAMUSCULAR | Status: DC
  Administered 2015-04-09 – 2015-04-10 (×4): 5000 [IU] via SUBCUTANEOUS
  Filled 2015-04-09 (×4): qty 1

## 2015-04-09 MED ORDER — LEVETIRACETAM 250 MG PO TABS
250.0000 mg | ORAL_TABLET | Freq: Two times a day (BID) | ORAL | Status: DC
  Administered 2015-04-09: 250 mg via ORAL
  Filled 2015-04-09 (×2): qty 1

## 2015-04-09 MED ORDER — ASPIRIN EC 81 MG PO TBEC
81.0000 mg | DELAYED_RELEASE_TABLET | Freq: Every day | ORAL | Status: DC
  Administered 2015-04-09: 81 mg via ORAL
  Filled 2015-04-09: qty 1

## 2015-04-09 MED ORDER — ONDANSETRON HCL 4 MG PO TABS: 4.0000 mg | ORAL_TABLET | Freq: Four times a day (QID) | ORAL | Status: DC | PRN

## 2015-04-09 MED ORDER — FAMOTIDINE 20 MG PO TABS
20.0000 mg | ORAL_TABLET | Freq: Two times a day (BID) | ORAL | Status: DC
  Administered 2015-04-09 – 2015-04-11 (×5): 20 mg via ORAL
  Filled 2015-04-09 (×5): qty 1

## 2015-04-09 MED ORDER — VITAMIN B-12 100 MCG PO TABS
400.0000 ug | ORAL_TABLET | Freq: Every day | ORAL | Status: DC
  Administered 2015-04-09 – 2015-04-11 (×3): 400 ug via ORAL
  Filled 2015-04-09 (×3): qty 4

## 2015-04-09 NOTE — ED Notes (Signed)
Patient reports seizure tonight.  States had 2 seizures back in 2010 and taking medication for same and no seizures until tonight.  Reports 3-4 months ago changed from dilantin to keppra and then completed stopped approximately 1 week ago per MD.

## 2015-04-09 NOTE — Consult Note (Signed)
UT:[id number]  HPI: Rachel Cox is an 64 y.o. female with history of seizures last one was in 2010. Was on Keppra 500 BID and was tapered down to 250 BID, until recently it was discontinued.  As per pt's husband pt has not have any side effects to Keppra.    Past Medical History  Diagnosis Date  . Allergy   . Hyperglycemia   . Hyperlipidemia   . Hypertension   . Hypothyroidism   . Insomnia   . Depression   . Anxiety   . Epilepsy ([place])     managed by Dr. Melrose Nakayama  . Anemia   . Osteopenia   . History of abnormal cervical Pap smear 1980's    Past Surgical History  Procedure Laterality Date  . Tubal ligation  1969  . Colonoscopy  Sept 2015  . Cryo procedure  1980s    for abnormal pap    Family History  Problem Relation Age of Onset  . Hyperlipidemia Mother   . Hypertension Mother   . CAD Mother   . COPD Father   . Cancer Brother     liver  . Alcohol abuse Brother   . Hypertension Brother   . Liver disease Brother   . Thyroid disease Mother   . Thyroid disease Father   . Thyroid disease Sister     Social History:  reports that she has never smoked. She has never used smokeless tobacco. She reports that she does not drink alcohol or use illicit drugs.  Allergies  Allergen Reactions  . Aspirin Nausea Only  . Codeine     Medications: I have reviewed the patient's current medications.  ROS: History obtained from the patient  General ROS: negative for - chills, fatigue, fever, night sweats, weight gain or weight loss Psychological ROS: negative for - behavioral disorder, hallucinations, memory difficulties, mood swings or suicidal ideation Ophthalmic ROS: negative for - blurry vision, double vision, eye pain or loss of vision ENT ROS: negative for - epistaxis, nasal discharge, oral lesions, sore throat, tinnitus or vertigo Allergy and Immunology ROS: negative for - hives or itchy/watery eyes Hematological and Lymphatic ROS: negative for - bleeding problems,  bruising or swollen lymph nodes Endocrine ROS: negative for - galactorrhea, hair pattern changes, polydipsia/polyuria or temperature intolerance Respiratory ROS: negative for - cough, hemoptysis, shortness of breath or wheezing Cardiovascular ROS: negative for - chest pain, dyspnea on exertion, edema or irregular heartbeat Gastrointestinal ROS: negative for - abdominal pain, diarrhea, hematemesis, nausea/vomiting or stool incontinence Genito-Urinary ROS: negative for - dysuria, hematuria, incontinence or urinary frequency/urgency Musculoskeletal ROS: negative for - joint swelling or muscular weakness Neurological ROS: as noted in HPI Dermatological ROS: negative for rash and skin lesion changes  Physical Examination: Blood pressure 109/58, pulse 85, temperature 98.3 F (36.8 C), temperature source Oral, resp. rate 18, height 5\' 6"  (1.676 m), weight 164 lb 1.6 oz (74.435 kg), SpO2 100 %.    Neurological Examination Mental Status: Alert to name, slight disorientation.   Cranial Nerves: II: Discs flat bilaterally; Visual fields grossly normal, pupils equal, round, reactive to light and accommodation III,IV, VI: ptosis not present, extra-ocular motions intact bilaterally V,VII: smile symmetric, facial light touch sensation normal bilaterally VIII: hearing normal bilaterally IX,X: gag reflex present XI: bilateral shoulder shrug XII: midline tongue extension Motor: Right : Upper extremity   5/5    Left:     Upper extremity   5/5  Lower extremity   5/5     Lower extremity  5/5 Tone and bulk:normal tone throughout; no atrophy noted Sensory: Pinprick and light touch intact throughout, bilaterally Deep Tendon Reflexes: 1+ and symmetric throughout Plantars: Right: downgoing   Left: downgoing Cerebellar: normal finger-to-nose, normal rapid alternating movements and normal heel-to-shin test Gait: not tested       Laboratory Studies:   Basic Metabolic Panel:  Recent Labs Lab  04/09/15 0123  NA 136  K 3.3*  CL 104  CO2 24  GLUCOSE 136*  BUN 14  CREATININE 0.98  CALCIUM 9.8    Liver Function Tests:  Recent Labs Lab 04/09/15 0123  AST 22  ALT 15  ALKPHOS 63  BILITOT 0.4  PROT 7.0  ALBUMIN 4.1   No results for input(s): LIPASE, AMYLASE in the last 168 hours. No results for input(s): AMMONIA in the last 168 hours.  CBC:  Recent Labs Lab 04/09/15 0123  WBC 6.9  NEUTROABS 3.8  HGB 7.4*  HCT 25.1*  MCV 65.9*  PLT 395    Cardiac Enzymes: No results for input(s): CKTOTAL, CKMB, CKMBINDEX, TROPONINI in the last 168 hours.  BNP: Invalid input(s): POCBNP  CBG: No results for input(s): GLUCAP in the last 168 hours.  Microbiology: No results found for this or any previous visit.  Coagulation Studies:  Recent Labs  04/09/15 0123  LABPROT 12.3  INR 0.89    Urinalysis:  Recent Labs Lab 04/09/15 0500  COLORURINE YELLOW*  LABSPEC 1.012  PHURINE 5.0  GLUCOSEU NEGATIVE  HGBUR NEGATIVE  BILIRUBINUR NEGATIVE  KETONESUR NEGATIVE  PROTEINUR 30*  NITRITE NEGATIVE  LEUKOCYTESUR NEGATIVE    Lipid Panel:     Component Value Date/Time   CHOL 171 12/22/2014 1542   TRIG 67 12/22/2014 1542   HDL 92 12/22/2014 1542   LDLCALC 66 12/22/2014 1542    HgbA1C:  Lab Results  Component Value Date   HGBA1C 5.8* 12/22/2014    Urine Drug Screen:     Component Value Date/Time   LABOPIA NONE DETECTED 04/09/2015 0500   LABBENZ NONE DETECTED 04/09/2015 0500   AMPHETMU NONE DETECTED 04/09/2015 0500   THCU NONE DETECTED 04/09/2015 0500   LABBARB NONE DETECTED 04/09/2015 0500    Alcohol Level:  Recent Labs Lab 04/09/15 0123  ETH <5      Imaging: Ct Head Wo Contrast  04/09/2015  CLINICAL DATA:  Seizure. Recent changes and antiseizure medications. EXAM: CT HEAD WITHOUT CONTRAST TECHNIQUE: Contiguous axial images were obtained from the base of the skull through the vertex without intravenous contrast. COMPARISON:  None. FINDINGS:  There is no intracranial hemorrhage, mass or evidence of acute infarction. There is mild generalized atrophy. There is white matter hypodensity consistent with chronic small vessel disease. The no bony abnormalities evident. Visible paranasal sinuses are clear. IMPRESSION: Mild atrophy and chronic small vessel changes. Electronically Signed   By: Andreas Newport M.D.   On: 04/09/2015 02:54   Dg Chest Portable 1 View  04/09/2015  CLINICAL DATA:  Seizure. EXAM: PORTABLE CHEST 1 VIEW COMPARISON:  None. FINDINGS: A single AP portable view of the chest demonstrates no focal airspace consolidation or alveolar edema. The lungs are grossly clear. There is no large effusion or pneumothorax. Cardiac and mediastinal contours appear unremarkable. IMPRESSION: No active disease. Electronically Signed   By: Andreas Newport M.D.   On: 04/09/2015 04:15     Assessment/Plan:  64 y.o. female with history of seizures last one was in 2010. Was on Keppra 500 BID and was tapered down to 250 BID, until recently it  was discontinued.  As per pt's husband pt has not have any side effects to Keppra.    - Pt s/p Keppra load - Keppra increased to 500 BID - Pt is to follow up with Dr. Melrose Nakayama in 2-4 weeks  - d/w pt's husband - no need for EEG or further imaging, likely d/c planning tonight or tomorrow. Suspect it would be tomorrow as pt still appears to be tired/fatigued.   Leotis Pain  04/09/2015, 9:58 AM

## 2015-04-09 NOTE — ED Notes (Addendum)
Pt began seizing with family in the room. When this RN entered room pt was contracted in both arms with eyes rolled back and grunting. Vitals are stable and pt is breathing a controlled 18 bpm. Family remains at bedside.

## 2015-04-09 NOTE — ED Notes (Signed)
Pt requesting to use restroom. Placed on bedpan but pt was unable to urinate.

## 2015-04-09 NOTE — H&P (Addendum)
Rachel Cox is an 64 y.o. female.   Chief Complaint: Seizure HPI: The patient presents emergency department following a seizure at home in her sleep. She has had no antecedent illness or head trauma. The patient has known seizure disorder but had not had any seizure activity for approximately 5 years. Her neurologist had gradually weaned her from her antiepileptic medication such that now she has been without medication for a few weeks. Her husband reports that she seized for an estimated 10 minutes. He states that it was a whole body seizure and describes it as tonic clonic in nature. The patient did not lose continence but did produce a copious amount of saliva and secretions. The patient had another seizure in the emergency department that lasted only a few minutes. Finally she suffered one more seizure while nursing staff was at the bedside. Seizure stopped with the menstruation of IV Ativan. Laboratory evaluation also revealed that the patient is acutely anemic. Due to her post ictal state and anemia, emergency department staff called for admission.  Past Medical History  Diagnosis Date  . Allergy   . Hyperglycemia   . Hyperlipidemia   . Hypertension   . Hypothyroidism   . Insomnia   . Depression   . Anxiety   . Epilepsy     managed by Dr. Melrose Nakayama  . Anemia   . Osteopenia   . History of abnormal cervical Pap smear 1980's    Past Surgical History  Procedure Laterality Date  . Tubal ligation  1969  . Colonoscopy  Sept 2015  . Cryo procedure  1980s    for abnormal pap    Family History  Problem Relation Age of Onset  . Hyperlipidemia Mother   . Hypertension Mother   . CAD Mother   . COPD Father   . Cancer Brother     liver  . Alcohol abuse Brother   . Hypertension Brother   . Liver disease Brother   . Thyroid disease Mother   . Thyroid disease Father   . Thyroid disease Sister    Social History:  reports that she has never smoked. She has never used smokeless tobacco.  She reports that she does not drink alcohol or use illicit drugs.  Allergies:  Allergies  Allergen Reactions  . Aspirin Nausea Only  . Codeine     Prior to Admission medications   Medication Sig Start Date End Date Taking? Authorizing Provider  aspirin EC 81 MG tablet Take 81 mg by mouth daily.    Historical Provider, MD  Cyanocobalamin (VITAMIN B-12 PO) Take 400 mcg by mouth daily.     Historical Provider, MD  fluticasone (FLONASE) 50 MCG/ACT nasal spray Place 2 sprays into both nostrils daily.    Historical Provider, MD  folic acid (FOLVITE) 570 MCG tablet Take 400 mcg by mouth daily.    Historical Provider, MD  levETIRAcetam (KEPPRA) 500 MG tablet Take 250 mg by mouth 2 (two) times daily.     Historical Provider, MD  levothyroxine (SYNTHROID, LEVOTHROID) 75 MCG tablet Take 1 tablet (75 mcg total) by mouth daily before breakfast. 03/07/15   Renato Shin, MD  losartan-hydrochlorothiazide (HYZAAR) 100-25 MG tablet TAKE ONE TABLET BY MOUTH ONCE DAILY 03/06/15   Arnetha Courser, MD  ranitidine (ZANTAC) 150 MG tablet Take 150 mg by mouth at bedtime.    Historical Provider, MD  simvastatin (ZOCOR) 40 MG tablet Take 1 tablet (40 mg total) by mouth at bedtime. 03/02/15   Melinda P  Sanda Klein, MD     Results for orders placed or performed during the hospital encounter of 04/09/15 (from the past 48 hour(s))  Comprehensive metabolic panel     Status: Abnormal   Collection Time: 04/09/15  1:23 AM  Result Value Ref Range   Sodium 136 135 - 145 mmol/L   Potassium 3.3 (L) 3.5 - 5.1 mmol/L   Chloride 104 101 - 111 mmol/L   CO2 24 22 - 32 mmol/L   Glucose, Bld 136 (H) 65 - 99 mg/dL   BUN 14 6 - 20 mg/dL   Creatinine, Ser 0.98 0.44 - 1.00 mg/dL   Calcium 9.8 8.9 - 10.3 mg/dL   Total Protein 7.0 6.5 - 8.1 g/dL   Albumin 4.1 3.5 - 5.0 g/dL   AST 22 15 - 41 U/L   ALT 15 14 - 54 U/L   Alkaline Phosphatase 63 38 - 126 U/L   Total Bilirubin 0.4 0.3 - 1.2 mg/dL   GFR calc non Af Amer 59 (L) >60 mL/min   GFR  calc Af Amer >60 >60 mL/min    Comment: (NOTE) The eGFR has been calculated using the CKD EPI equation. This calculation has not been validated in all clinical situations. eGFR's persistently <60 mL/min signify possible Chronic Kidney Disease.    Anion gap 8 5 - 15  CBC WITH DIFFERENTIAL     Status: Abnormal   Collection Time: 04/09/15  1:23 AM  Result Value Ref Range   WBC 6.9 3.6 - 11.0 K/uL   RBC 3.81 3.80 - 5.20 MIL/uL   Hemoglobin 7.4 (L) 12.0 - 16.0 g/dL   HCT 25.1 (L) 35.0 - 47.0 %   MCV 65.9 (L) 80.0 - 100.0 fL   MCH 19.6 (L) 26.0 - 34.0 pg   MCHC 29.7 (L) 32.0 - 36.0 g/dL   RDW 18.0 (H) 11.5 - 14.5 %   Platelets 395 150 - 440 K/uL   Neutrophils Relative % 55 %   Neutro Abs 3.8 1.4 - 6.5 K/uL   Lymphocytes Relative 30 %   Lymphs Abs 2.1 1.0 - 3.6 K/uL   Monocytes Relative 11 %   Monocytes Absolute 0.8 0.2 - 0.9 K/uL   Eosinophils Relative 3 %   Eosinophils Absolute 0.2 0 - 0.7 K/uL   Basophils Relative 1 %   Basophils Absolute 0.1 0 - 0.1 K/uL  Ethanol/ETOH     Status: None   Collection Time: 04/09/15  1:23 AM  Result Value Ref Range   Alcohol, Ethyl (B) <5 <5 mg/dL    Comment:        LOWEST DETECTABLE LIMIT FOR SERUM ALCOHOL IS 5 mg/dL FOR MEDICAL PURPOSES ONLY   Protime-INR     Status: None   Collection Time: 04/09/15  1:23 AM  Result Value Ref Range   Prothrombin Time 12.3 11.4 - 15.0 seconds   INR 0.89    Ct Head Wo Contrast  04/09/2015  CLINICAL DATA:  Seizure. Recent changes and antiseizure medications. EXAM: CT HEAD WITHOUT CONTRAST TECHNIQUE: Contiguous axial images were obtained from the base of the skull through the vertex without intravenous contrast. COMPARISON:  None. FINDINGS: There is no intracranial hemorrhage, mass or evidence of acute infarction. There is mild generalized atrophy. There is white matter hypodensity consistent with chronic small vessel disease. The no bony abnormalities evident. Visible paranasal sinuses are clear. IMPRESSION:  Mild atrophy and chronic small vessel changes. Electronically Signed   By: Andreas Newport M.D.   On: 04/09/2015 02:54  Review of Systems  Constitutional: Negative for fever and chills.  HENT: Negative for sore throat and tinnitus.   Eyes: Negative for blurred vision and redness.  Respiratory: Negative for cough and shortness of breath.   Cardiovascular: Negative for chest pain, palpitations, orthopnea and PND.  Gastrointestinal: Negative for nausea, vomiting, abdominal pain and diarrhea.  Genitourinary: Negative for dysuria, urgency and frequency.  Musculoskeletal: Negative for myalgias and joint pain.  Skin: Negative for rash.       No lesions  Neurological: Negative for speech change, focal weakness and weakness.  Endo/Heme/Allergies: Does not bruise/bleed easily.       No temperature intolerance  Psychiatric/Behavioral: Negative for depression and suicidal ideas.    Blood pressure 123/68, pulse 116, temperature 98.3 F (36.8 C), temperature source Oral, resp. rate 20, height $RemoveBe'5\' 6"'[id number]$  (1.676 m), weight 72.576 kg (160 lb), SpO2 98 %. Physical Exam  Nursing note and vitals reviewed. Constitutional: She is oriented to person, place, and time. She appears well-developed and well-nourished. She appears lethargic. No distress.  HENT:  Head: Normocephalic and atraumatic.  Mouth/Throat: Oropharynx is clear and moist.  Eyes: Conjunctivae and EOM are normal. Pupils are equal, round, and reactive to light. No scleral icterus.  Neck: Normal range of motion. Neck supple. No JVD present. No tracheal deviation present. No thyromegaly present.  Cardiovascular: Normal rate, regular rhythm and normal heart sounds.  Exam reveals no gallop and no friction rub.   No murmur heard. Respiratory: Effort normal and breath sounds normal.  GI: Soft. Bowel sounds are normal. She exhibits no distension. There is no tenderness.  Genitourinary:  Deferred  Musculoskeletal: Normal range of motion. She exhibits  edema (trace).  Lymphadenopathy:    She has no cervical adenopathy.  Neurological: She is oriented to person, place, and time. She appears lethargic. No cranial nerve deficit. She exhibits normal muscle tone. Coordination normal.  Post-ictal  Skin: Skin is warm and dry.  Psychiatric: She has a normal mood and affect. Her behavior is normal. Judgment and thought content normal.     Assessment/Plan This is a 64 year old Caucasian female admitted for seizure disorder and anemia. 1. Seizure disorder: Seizure threshold lowered following discontinuation of antiepileptic medication. The patient has been loaded with [place] emergency department we will continue her original oral dose. No aspiration on chest x-ray. CT head without acute changes. Neurology consultation ordered. 2. Anemia: Microcytic; appears to be iron deficient. The patient had a colonoscopy approximately 2 months ago which was normal. The family reports a known history of anemia but the patient has not been taking her vitamin or iron supplements lately. Check iron studies. One unit RBCs to be transfused. 3. Hypertension: Continue Hyzaar 4. Hypothyroidism: Continue Synthroid. Check TSH 5. DVT prophylaxis: Heparin 6. GI prophylaxis: H2 blocker per home regimen The patient is a full code. Time spent on admission orders and patient care approximately 35 minutes  Harrie Foreman 04/09/2015, 4:10 AM

## 2015-04-09 NOTE — ED Notes (Signed)
Pt able to follow commands but is very restless and reports being uncomfortable with back and shoulder pain. MD made aware. Pt also started having the hiccups that caused a vomiting. Pt reports having a  History of GERD.

## 2015-04-09 NOTE — ED Notes (Signed)
Pt beginning to contract and eyes are rolling back in head. MD made aware.

## 2015-04-09 NOTE — Care Management Note (Signed)
Case Management Note  Patient Details  Name: Rachel Cox MRN: [id number] Date of Birth: [birth year]  Subjective/Objective:   64yo Rachel Cox was admitted 04/09/15 with seizure activity after her PCP discontinued her seizure medication 1 week ago. Pharmacy=[facility] on [address]. Resides with her husband. PCP=Melinda Lada. Denies having any home assistive equipment and no home oxygen. Has no home health services and husband verbalized that he does not think that Rachel Lett needs home health. No home health needs anticipated. Case management will follow for discharge planning.               Action/Plan:   Expected Discharge Date:                  Expected Discharge Plan:     In-House Referral:     Discharge planning Services     Post Acute Care Choice:    Choice offered to:     DME Arranged:    DME Agency:     HH Arranged:    [place] Agency:     Status of Service:     Medicare Important Message Given:    Date Medicare IM Given:    Medicare IM give by:    Date Additional Medicare IM Given:    Additional Medicare Important Message give by:     If discussed at [place] of Stay Meetings, dates discussed:    Additional Comments:  Vasily Fedewa A, RN 04/09/2015, 4:10 PM

## 2015-04-09 NOTE — Progress Notes (Signed)
Seth Ward at [facility]                                                                                                                                                                                            Patient Demographics   Rachel Cox, is a 64 y.o. female, DOB - 1951/01/26, AYT:[id number]  Admit date - 04/09/2015   Admitting Physician Harrie Foreman, MD  Outpatient Primary MD for the patient is Enid Derry, MD   LOS -   Subjective: Patient admitted with seizure and severe anemia. she complains of pain in her back. No further seizure activity was seen by neurology     Review of Systems:   CONSTITUTIONAL: No documented fever. Positive fatigue, positive weakness. No weight gain, no weight loss.  EYES: No blurry or double vision.  ENT: No tinnitus. No postnasal drip. No redness of the oropharynx.  RESPIRATORY: No cough, no wheeze, no hemoptysis. No dyspnea.  CARDIOVASCULAR: No chest pain. No orthopnea. No palpitations. No syncope.  GASTROINTESTINAL: No nausea, no vomiting or diarrhea. No abdominal pain. No melena or hematochezia.  GENITOURINARY: No dysuria or hematuria.  ENDOCRINE: No polyuria or nocturia. No heat or cold intolerance.  HEMATOLOGY: No anemia. No bruising. No bleeding.  INTEGUMENTARY: No rashes. No lesions.  MUSCULOSKELETAL: No arthritis. No swelling. No gout.  NEUROLOGIC: No numbness, tingling, or ataxia. No seizure-type activity.  PSYCHIATRIC: No anxiety. No insomnia. No ADD.    Vitals:   Filed Vitals:   04/09/15 0629 04/09/15 0821 04/09/15 0925 04/09/15 1111  BP:  115/64 109/58 113/65  Pulse: 93 87 85 76  Temp:  98.5 F (36.9 C) 98.3 F (36.8 C) 98.8 F (37.1 C)  TempSrc:  Oral Oral Oral  Resp:  16 18 18   Height:      Weight:      SpO2: 100% 100% 100% 100%    Wt Readings from Last 3 Encounters:  04/09/15 74.435 kg (164 lb 1.6 oz)  03/02/15 73.936 kg (163 lb)  01/20/15 73.029 kg (161 lb)      Intake/Output Summary (Last 24 hours) at 04/09/15 1252 Last data filed at 04/09/15 0925  Gross per 24 hour  Intake    571 ml  Output    200 ml  Net    371 ml    Physical Exam:   GENERAL: Pleasant-appearing . Uncomfortable due to pain in the back HEAD, EYES, EARS, NOSE AND THROAT: Atraumatic, normocephalic. Extraocular muscles are intact. Pupils equal and reactive to light. Sclerae anicteric. No conjunctival injection. No oro-pharyngeal erythema.  NECK: Supple. There is no jugular venous distention.  No bruits, no lymphadenopathy, no thyromegaly.  HEART: Regular rate and rhythm,. No murmurs, no rubs, no clicks.  LUNGS: Clear to auscultation bilaterally. No rales or rhonchi. No wheezes.  ABDOMEN: Soft, flat, nontender, nondistended. Has good bowel sounds. No hepatosplenomegaly appreciated.  EXTREMITIES: No evidence of any cyanosis, clubbing, or peripheral edema.  +2 pedal and radial pulses bilaterally.  NEUROLOGIC: The patient is alert, awake, and oriented x3 with no focal motor or sensory deficits appreciated bilaterally.  SKIN: Moist and warm with no rashes appreciated.  Psych: Not anxious, depressed LN: No inguinal LN enlargement    Antibiotics   Anti-infectives    None      Medications   Scheduled Meds: . aspirin EC  81 mg Oral Daily  . docusate sodium  100 mg Oral BID  . famotidine  20 mg Oral BID  . fluticasone  2 spray Each Nare Daily  . folic acid  078 mcg Oral Daily  . heparin  5,000 Units Subcutaneous 3 times per day  . losartan  100 mg Oral Daily   And  . hydrochlorothiazide  25 mg Oral Daily  . levETIRAcetam  500 mg Oral BID  . levothyroxine  75 mcg Oral QAC breakfast  . simvastatin  40 mg Oral QHS  . vitamin B-12  400 mcg Oral Daily   Continuous Infusions: . sodium chloride 100 mL/hr at 04/09/15 0926   PRN Meds:.acetaminophen **OR** acetaminophen, morphine injection, ondansetron **OR** ondansetron (ZOFRAN) IV, oxyCODONE-acetaminophen   Data  Review:   Micro Results No results found for this or any previous visit (from the past 240 hour(s)).  Radiology Reports Ct Head Wo Contrast  04/09/2015  CLINICAL DATA:  Seizure. Recent changes and antiseizure medications. EXAM: CT HEAD WITHOUT CONTRAST TECHNIQUE: Contiguous axial images were obtained from the base of the skull through the vertex without intravenous contrast. COMPARISON:  None. FINDINGS: There is no intracranial hemorrhage, mass or evidence of acute infarction. There is mild generalized atrophy. There is white matter hypodensity consistent with chronic small vessel disease. The no bony abnormalities evident. Visible paranasal sinuses are clear. IMPRESSION: Mild atrophy and chronic small vessel changes. Electronically Signed   By: Andreas Newport M.D.   On: 04/09/2015 02:54   Dg Chest Portable 1 View  04/09/2015  CLINICAL DATA:  Seizure. EXAM: PORTABLE CHEST 1 VIEW COMPARISON:  None. FINDINGS: A single AP portable view of the chest demonstrates no focal airspace consolidation or alveolar edema. The lungs are grossly clear. There is no large effusion or pneumothorax. Cardiac and mediastinal contours appear unremarkable. IMPRESSION: No active disease. Electronically Signed   By: Andreas Newport M.D.   On: 04/09/2015 04:15     CBC  Recent Labs Lab 04/09/15 0123  WBC 6.9  HGB 7.4*  HCT 25.1*  PLT 395  MCV 65.9*  MCH 19.6*  MCHC 29.7*  RDW 18.0*  LYMPHSABS 2.1  MONOABS 0.8  EOSABS 0.2  BASOSABS 0.1    Chemistries   Recent Labs Lab 04/09/15 0123  NA 136  K 3.3*  CL 104  CO2 24  GLUCOSE 136*  BUN 14  CREATININE 0.98  CALCIUM 9.8  AST 22  ALT 15  ALKPHOS 63  BILITOT 0.4   ------------------------------------------------------------------------------------------------------------------ estimated creatinine clearance is 58.2 mL/min (by C-G formula based on Cr of  0.98). ------------------------------------------------------------------------------------------------------------------ No results for input(s): HGBA1C in the last 72 hours. ------------------------------------------------------------------------------------------------------------------ No results for input(s): CHOL, HDL, LDLCALC, TRIG, CHOLHDL, LDLDIRECT in the last 72 hours. ------------------------------------------------------------------------------------------------------------------  Recent Labs  04/08/15 0528  TSH 5.028*   ------------------------------------------------------------------------------------------------------------------  Recent Labs  04/08/15 0528  FERRITIN 4*  TIBC 473*  IRON 11*    Coagulation profile  Recent Labs Lab 04/09/15 0123  INR 0.89    No results for input(s): DDIMER in the last 72 hours.  Cardiac Enzymes No results for input(s): CKMB, TROPONINI, MYOGLOBIN in the last 168 hours.  Invalid input(s): CK ------------------------------------------------------------------------------------------------------------------ Invalid input(s): [place]   Assessment/Plan This is a 64 year old Caucasian female admitted for seizure disorder and anemia. 1. Seizure disorder: Seizure threshold lowered following discontinuation of antiepileptic medication. Patient resumed on Keppra. 2. Anemia: Microcytic; appears to be iron deficient.severe iron deficiency anemia status post transfusion  3. Hypertension: Continue Hyzaar 4. Hypothyroidism: Continue Synthroid. Check TSH 5. Back pain no trauma or fall we'll try some pain medications 6. GI prophylaxis: H2 blocker per home regimen      Code Status Orders        Start     Ordered   04/09/15 0455  Full code   Continuous     04/09/15 0455           Consults neurology   DVT Prophylaxis  heparin   Lab Results  Component Value Date   PLT 395 04/09/2015     Time  Spent in minutes   45 minutes    Dustin Flock M.D on 04/09/2015 at 12:52 PM  Between 7am to 6pm - Pager - [phone]  After 6pm go to [email or web address] - password [email or web address] [place] [facility]   Office  [phone]

## 2015-04-09 NOTE — ED Provider Notes (Signed)
[facility] Emergency Department Provider Note  ____________________________________________  Time seen: Approximately 2:18 AM  I have reviewed the triage vital signs and the nursing notes.   HISTORY  Chief Complaint Seizures  Patient is postictal and unable to provide a history  HPI Rachel Cox is a 64 y.o. female with a history of seizure disorder but who is no longer on medication presents by EMS for a seizure at home.  Her husband provides the history.  He reports that the patient's neurologist is Dr. Melrose Nakayama.  The patient previously took Dilantin and then was switched to [place].  She was changed to half of her usual dose of keppra about 2 weeks ago, and then within the last week the Keppra was completely discontinued. Tonight her husband went to check on her when he heard an odd noise and found her having generalized shaking with her eyes rolled in the back of her head and she was unresponsive which is consistent with her prior seizures.  He does not know how long it lasted but he thinks possibly as much as 15 minutes.  She had stopped seizing when EMS arrived but she was very tired and confused and minimally responsive.  I was called to the room emergently because of another seizure.  When I entered the room she was actively seizing, foaming at the mouth with her jaw or rigid and having generalized tonic-clonic activity.  We began suctioning her mouth that before another nurse could not return with Ativan the patient had stopped seizing and was again postictal.  Her symptoms were acute in onset and severe.  Her family denies that she has had any recent illnesses although they do state that she has been increasingly weak all over with general malaise over the last couple of days.   Past Medical History  Diagnosis Date  . Allergy   . Hyperglycemia   . Hyperlipidemia   . Hypertension   . Hypothyroidism   . Insomnia   . Depression   . Anxiety   . Epilepsy  ([place])     managed by Dr. Melrose Nakayama  . Anemia   . Osteopenia   . History of abnormal cervical Pap smear 1980's    Patient Active Problem List   Diagnosis Date Noted  . Seizure ([place]) 04/09/2015  . Leg cramps 03/02/2015  . Vitamin D deficiency 03/02/2015  . Microalbuminuria 03/02/2015  . Medication monitoring encounter 03/02/2015  . Thyroid nodule 03/02/2015  . Allergic dermatitis 12/22/2014  . Allergy   . Hyperglycemia   . Hyperlipidemia   . Hypertension   . Hypothyroidism   . Insomnia   . Depression   . Anxiety   . Epilepsy ([place])   . Osteopenia     Past Surgical History  Procedure Laterality Date  . Tubal ligation  1969  . Colonoscopy  Sept 2015  . Cryo procedure  1980s    for abnormal pap    No current outpatient prescriptions on file.  Allergies Aspirin and Codeine  Family History  Problem Relation Age of Onset  . Hyperlipidemia Mother   . Hypertension Mother   . CAD Mother   . COPD Father   . Cancer Brother     liver  . Alcohol abuse Brother   . Hypertension Brother   . Liver disease Brother   . Thyroid disease Mother   . Thyroid disease Father   . Thyroid disease Sister     Social History Social History  Substance Use Topics  .  Smoking status: Never Smoker   . Smokeless tobacco: Never Used  . Alcohol Use: No    Review of Systems Unable to obtain from the patient due to altered mental status/post ictal  ____________________________________________   PHYSICAL EXAM:  VITAL SIGNS: ED Triage Vitals  Enc Vitals Group     BP 04/09/15 0144 133/77 mmHg     Pulse Rate 04/09/15 0144 107     Resp 04/09/15 0144 20     Temp 04/09/15 0144 98.3 F (36.8 C)     Temp Source 04/09/15 0144 Oral     SpO2 04/09/15 0144 98 %     Weight 04/09/15 0144 160 lb (72.576 kg)     Height 04/09/15 0144 5\' 6"  (1.676 m)     Head Cir --      Peak Flow --      Pain Score --      Pain Loc --      Pain Edu? --      Excl. in [place]? --     Constitutional: Lethargic  status post seizure.  Responds to painful stimuli, otherwise does not respond. Eyes: Conjunctivae are normal. PERRL. EOMI. Head: Atraumatic. Nose: No congestion/rhinnorhea. Mouth/Throat: Mucous membranes are moist.  Oropharynx non-erythematous. Neck: No stridor.   Cardiovascular: Normal rate, regular rhythm. Grossly normal heart sounds.  Good peripheral circulation. Respiratory: Normal respiratory effort.  No retractions. Lungs CTAB. Gastrointestinal: Soft and nontender. No distention. No abdominal bruits. No CVA tenderness. Musculoskeletal: No lower extremity tenderness nor edema.  No joint effusions. Neurologic:  No gross focal neurologic deficits are appreciated, but the patient cannot participate in the exam. Skin:  Skin is warm, dry and intact. No rash noted.   ____________________________________________   LABS (all labs ordered are listed, but only abnormal results are displayed)  Labs Reviewed  COMPREHENSIVE METABOLIC PANEL - Abnormal; Notable for the following:    Potassium 3.3 (*)    Glucose, Bld 136 (*)    GFR calc non Af Amer 59 (*)    All other components within normal limits  CBC WITH DIFFERENTIAL/PLATELET - Abnormal; Notable for the following:    Hemoglobin 7.4 (*)    HCT 25.1 (*)    MCV 65.9 (*)    MCH 19.6 (*)    MCHC 29.7 (*)    RDW 18.0 (*)    All other components within normal limits  URINALYSIS COMPLETEWITH MICROSCOPIC ([facility] ONLY) - Abnormal; Notable for the following:    Color, Urine YELLOW (*)    APPearance CLEAR (*)    Protein, ur 30 (*)    Bacteria, UA RARE (*)    Squamous Epithelial / LPF 0-5 (*)    All other components within normal limits  TSH - Abnormal; Notable for the following:    TSH 5.028 (*)    All other components within normal limits  IRON AND TIBC - Abnormal; Notable for the following:    Iron 11 (*)    TIBC 473 (*)    Saturation Ratios 2 (*)    All other components within normal limits  FERRITIN - Abnormal; Notable for the  following:    Ferritin 4 (*)    All other components within normal limits  URINE DRUG SCREEN, QUALITATIVE ([facility] ONLY)  ETHANOL  PROTIME-INR  TRANSFERRIN  PREPARE RBC (CROSSMATCH)  TYPE AND SCREEN  ABO/RH   ____________________________________________  EKG  Not obtained ____________________________________________  RADIOLOGY   Ct Head Wo Contrast  04/09/2015  CLINICAL DATA:  Seizure. Recent changes and  antiseizure medications. EXAM: CT HEAD WITHOUT CONTRAST TECHNIQUE: Contiguous axial images were obtained from the base of the skull through the vertex without intravenous contrast. COMPARISON:  None. FINDINGS: There is no intracranial hemorrhage, mass or evidence of acute infarction. There is mild generalized atrophy. There is white matter hypodensity consistent with chronic small vessel disease. The no bony abnormalities evident. Visible paranasal sinuses are clear. IMPRESSION: Mild atrophy and chronic small vessel changes. Electronically Signed   By: Andreas Newport M.D.   On: 04/09/2015 02:54   Dg Chest Portable 1 View  04/09/2015  CLINICAL DATA:  Seizure. EXAM: PORTABLE CHEST 1 VIEW COMPARISON:  None. FINDINGS: A single AP portable view of the chest demonstrates no focal airspace consolidation or alveolar edema. The lungs are grossly clear. There is no large effusion or pneumothorax. Cardiac and mediastinal contours appear unremarkable. IMPRESSION: No active disease. Electronically Signed   By: Andreas Newport M.D.   On: 04/09/2015 04:15    ____________________________________________   PROCEDURES  Procedure(s) performed: None  Critical Care performed: Yes, see critical care note(s)   CRITICAL CARE Performed by: Hinda Kehr   Total critical care time: 30 minutes  Critical care time was exclusive of separately billable procedures and treating other patients.  Critical care was necessary to treat or prevent imminent or life-threatening deterioration.  Critical care  was time spent personally by me on the following activities: development of treatment plan with patient and/or surrogate as well as nursing, discussions with consultants, evaluation of patient's response to treatment, examination of patient, obtaining history from patient or surrogate, ordering and performing treatments and interventions, ordering and review of laboratory studies, ordering and review of radiographic studies, pulse oximetry and re-evaluation of patient's condition.  ____________________________________________   INITIAL IMPRESSION / ASSESSMENT AND PLAN / ED COURSE  Pertinent labs & imaging results that were available during my care of the patient were reviewed by me and considered in my medical decision making (see chart for details).  The patient was actively seizing when I entered the room and as described above she stopped before giving Ativan.  I suctioned her mouth and we gave Her a 1 g IV as a loading dose.  I performed a usual and customary seizure workup including a negative head CT and unremarkable labs with the exception of her anemia.  She has a significantly low hemoglobin and the family was surprised by this.  Her rectal exam showed Hemoccult negative stool.  Though this may not contribute to the seizures, and may explain her generalized weakness and malaise recently.  I will provide one unit of PRBCs and I did my usual and customary risks/benefits talk with the family regarding the blood transfusion.  I will admit her for further management of her seizure disorder given that she has had 2 seizures within the last couple of hours and needs to be seen by neurology.  ____________________________________________  FINAL CLINICAL IMPRESSION(S) / ED DIAGNOSES  Final diagnoses:  Seizures ([place])  Symptomatic anemia      NEW MEDICATIONS STARTED DURING THIS VISIT:  Current Discharge Medication List       Hinda Kehr, MD 04/09/15 [phone]

## 2015-04-10 LAB — CBC
HEMATOCRIT: 23.8 % — AB (ref 35.0–47.0)
HEMOGLOBIN: 7.2 g/dL — AB (ref 12.0–16.0)
MCH: 21.1 pg — AB (ref 26.0–34.0)
MCHC: 30.5 g/dL — AB (ref 32.0–36.0)
MCV: 69.1 fL — ABNORMAL LOW (ref 80.0–100.0)
Platelets: 267 10*3/uL (ref 150–440)
RBC: 3.44 MIL/uL — ABNORMAL LOW (ref 3.80–5.20)
RDW: 20 % — ABNORMAL HIGH (ref 11.5–14.5)
WBC: 5.9 10*3/uL (ref 3.6–11.0)

## 2015-04-10 LAB — BASIC METABOLIC PANEL
Anion gap: 5 (ref 5–15)
BUN: 12 mg/dL (ref 6–20)
CHLORIDE: 111 mmol/L (ref 101–111)
CO2: 25 mmol/L (ref 22–32)
CREATININE: 0.85 mg/dL (ref 0.44–1.00)
Calcium: 9 mg/dL (ref 8.9–10.3)
GFR calc Af Amer: >60 mL/min (ref >60)
GFR calc non Af Amer: >60 mL/min (ref >60)
Glucose, Bld: 97 mg/dL (ref 65–99)
Potassium: 3.2 mmol/L — ABNORMAL LOW (ref 3.5–5.1)
Sodium: 141 mmol/L (ref 135–145)

## 2015-04-10 LAB — SEDIMENTATION RATE: SED RATE: 26 mm/h (ref 0–30)

## 2015-04-10 LAB — PREPARE RBC (CROSSMATCH)

## 2015-04-10 MED ORDER — POTASSIUM CHLORIDE CRYS ER 20 MEQ PO TBCR
40.0000 meq | EXTENDED_RELEASE_TABLET | Freq: Once | ORAL | Status: AC
  Administered 2015-04-10: 40 meq via ORAL
  Filled 2015-04-10: qty 2

## 2015-04-10 MED ORDER — SODIUM CHLORIDE 0.9 % IV SOLN
Freq: Once | INTRAVENOUS | Status: AC
  Administered 2015-04-10: 14:00:00 via INTRAVENOUS

## 2015-04-10 NOTE — Consult Note (Signed)
GI Inpatient Consult Note  Reason for Consult: Anemia & s/p seizures   Attending Requesting Consult: S. Patel  History of Present Illness: Rachel Cox is a 64 y.o. female with a history of IDA (previously on oral iron supplementation), epilepsy (previously on Keppra), HTN, hyperlipidemia, and hypothyroidism admitted following two seizures yesterday.  Per ED provider notes, patient is followed by Dr. Melrose Nakayama for epilepsy as recently stopped Keppra.  She had been maintained on Dilantin for some time, then Keppra, then d/c.  Notes indicate patient's husband found patient actively seizing, with the episode lasting 15 minutes followed by a post-ictal period.  Upon arrival to the ED, patient experienced another seizure.  Significant labs included Hgb 7.4, MCV 65.9, Iron 11, Ferritin 4, TSH 5.028.  CT head was also negative and hemodynamics were stable.  She was admitted for further evaluation and management.  Since admission, [name] received 1 until PRBCs, which raised Hgb to 7.2.  Another unit is pending administration due to lack of significant improvement.  [name] reports she is feeling better than yesterday, with some mild diffuse body soreness, weakness, and fatigue following the seizures.  She does mention she was feeling generally not well and "like my usual self" for 3-4 days prior to her seizures.  She currently denies dizziness, lightheadedness, CP, SOB, nausea/vomiting, abdominal pain, hematochezia, and melena.  Her weight and appetite have been stable, and she denies significant NSAID use, EtOH, or tobacco.  Patient notes she was followed for IDA "a few years ago", but was told she could stop taking her oral iron supplement.  She also had a colonoscopy 02/2014 with Dr. Candace Cruise, but no previous endoscopies.  Family history is not significant for CCA, polyps or other GI malignancy.    Past Medical History:  Past Medical History  Diagnosis Date  . Allergy   . Hyperglycemia   .  Hyperlipidemia   . Hypertension   . Hypothyroidism   . Insomnia   . Depression   . Anxiety   . Epilepsy ([place])     managed by Dr. Melrose Nakayama  . Anemia   . Osteopenia   . History of abnormal cervical Pap smear 1980's    Problem List: Patient Active Problem List   Diagnosis Date Noted  . Seizure ([place]) 04/09/2015  . Anemia 04/09/2015  . Leg cramps 03/02/2015  . Vitamin D deficiency 03/02/2015  . Microalbuminuria 03/02/2015  . Medication monitoring encounter 03/02/2015  . Thyroid nodule 03/02/2015  . Allergic dermatitis 12/22/2014  . Allergy   . Hyperglycemia   . Hyperlipidemia   . Hypertension   . Hypothyroidism   . Insomnia   . Depression   . Anxiety   . Epilepsy ([place])   . Osteopenia     Past Surgical History: Past Surgical History  Procedure Laterality Date  . Tubal ligation  1969  . Colonoscopy  Sept 2015  . Cryo procedure  1980s    for abnormal pap    Allergies: Allergies  Allergen Reactions  . Aspirin Nausea Only  . Codeine Palpitations    Pt vocalized    Home Medications: Prescriptions prior to admission  Medication Sig Dispense Refill Last Dose  . aspirin EC 81 MG tablet Take 81 mg by mouth daily.   04/09/2015 at Unknown time  . Cyanocobalamin (VITAMIN B-12 PO) Take 400 mcg by mouth daily.    04/09/2015 at Unknown time  . fluticasone (FLONASE) 50 MCG/ACT nasal spray Place 2 sprays into both nostrils daily.  04/09/2015 at Unknown time  . folic acid (FOLVITE) 295 MCG tablet Take 400 mcg by mouth daily.   Taking  . losartan-hydrochlorothiazide (HYZAAR) 100-25 MG tablet TAKE ONE TABLET BY MOUTH ONCE DAILY 90 tablet 1 04/09/2015 at Unknown time  . simvastatin (ZOCOR) 40 MG tablet Take 1 tablet (40 mg total) by mouth at bedtime. 90 tablet 1 04/09/2015 at Unknown time   Home medication reconciliation was completed with the patient.   Scheduled Inpatient Medications:   . docusate sodium  100 mg Oral BID  . famotidine  20 mg Oral BID  . fluticasone  2 spray Each  Nare Daily  . folic acid  284 mcg Oral Daily  . losartan  100 mg Oral Daily   And  . hydrochlorothiazide  25 mg Oral Daily  . levETIRAcetam  500 mg Oral BID  . levothyroxine  75 mcg Oral QAC breakfast  . simvastatin  40 mg Oral QHS  . vitamin B-12  400 mcg Oral Daily    Continuous Inpatient Infusions:   . sodium chloride 100 mL/hr at 04/10/15 0530    PRN Inpatient Medications:  acetaminophen **OR** acetaminophen, morphine injection, ondansetron **OR** ondansetron (ZOFRAN) IV, oxyCODONE-acetaminophen  Family History: family history includes Alcohol abuse in her brother; CAD in her mother; COPD in her father; Cancer in her brother; Hyperlipidemia in her mother; Hypertension in her brother and mother; Liver disease in her brother; Thyroid disease in her father, mother, and sister.   Social History:   reports that she has never smoked. She has never used smokeless tobacco. She reports that she does not drink alcohol or use illicit drugs.   Review of Systems: Constitutional: Weight is stable.  Eyes: No changes in vision. ENT: No oral lesions, sore throat.  GI: see HPI.  Heme/Lymph: No easy bruising.  CV: No chest pain.  GU: No hematuria.  Integumentary: No rashes.  Neuro: No headaches.  Psych: No depression/anxiety.  Endocrine: No heat/cold intolerance.  Allergic/Immunologic: No urticaria.  Resp: No cough, SOB.  Musculoskeletal: No joint swelling.    Physical Examination: BP 128/74 mmHg  Pulse 74  Temp(Src) 98 F (36.7 C) (Oral)  Resp 19  Ht 5\' 6"  (1.676 m)  Wt 81.194 kg (179 lb)  BMI 28.91 kg/m2  SpO2 99% Gen: NAD, alert and oriented x 4 HEENT: PEERLA, EOMI, Neck: supple, no JVD or thyromegaly Chest: CTA bilaterally, no wheezes, crackles, or other adventitious sounds CV: RRR, no m/g/c/r Abd: soft, NT, ND, +BS in all four quadrants; no HSM, guarding, ridigity, or rebound tenderness Ext: no edema, well perfused with 2+ pulses, Skin: no rash or lesions noted Lymph:  no LAD  Data: Lab Results  Component Value Date   WBC 5.9 04/10/2015   HGB 7.2* 04/10/2015   HCT 23.8* 04/10/2015   MCV 69.1* 04/10/2015   PLT 267 04/10/2015    Recent Labs Lab 04/09/15 0123 04/10/15 0324  HGB 7.4* 7.2*   Lab Results  Component Value Date   NA 141 04/10/2015   K 3.2* 04/10/2015   CL 111 04/10/2015   CO2 25 04/10/2015   BUN 12 04/10/2015   CREATININE 0.85 04/10/2015   Lab Results  Component Value Date   ALT 15 04/09/2015   AST 22 04/09/2015   ALKPHOS 63 04/09/2015   BILITOT 0.4 04/09/2015    Recent Labs Lab 04/09/15 0123  INR 0.89   Assessment/Plan: Ms. Bolanos is a 64 y.o. female with a history of IDA (previously on oral iron supplementation), epilepsy (  previously on Keppra), HTN, hyperlipidemia, and hypothyroidism admitted following two seizures yesterday. Hgb was 7.4 on arrival, decreased at 7.2 after 1 unit PRBCs.  A second unit is being prepared for transfusion, but hemodynamics remain stable.  Patient denies any GI bleeding symptoms or CCA alarm symptoms, and notes a h/o IDA that required oral iron supplementation. I recommend resuming the oral iron supplement and planning for OP follow-up to monitor anemia and schedule procedures.  This will allow time for neurologic conditions to stabilize before undergoing anesthesia.  Patient had a colonoscopy 02/2014, so a repeat with EGD may need to be discussed as OP to evaluate IDA if Hgb drops again.  Inpatient procedures will be considered if Hgb continues to remain low despite repeated transfusions, frank bleeding/melena occurs, or hemodynamics are no longer stable.  Will continue to follow.  Recommendations: - Monitor Hgb, transfuse <7 - Restart oral ferrous sulfate 325mg  daily - Plan for follow-up OP in 2 weeks to monitor anemia and schedule colonoscopy/EGD if Hgb stabilizes - this will allow time for patient to resume epilepsy treatment and stabilize prior to undergoing anesthesia - Will consider IP  procedures if Hgb continues to remain low despite repeated transfusions, frank bleeding/melena occurs, or hemodynamics are no longer stable.  Thank you for the consult. We will follow along with you. Please call with questions or concerns.  Lavera Guise, PA-C [facility] Phone: [phone] Pager: [phone]

## 2015-04-10 NOTE — Care Management (Signed)
PT evaluation pending per RN. Met briefly with patient and her husband. Patient was sitting on side of the bed with her husband at her side. She currently denies need for rolling [place] although she does not have one and has "sinking spells". List of home health agencies left with patient for review. She did not indicate that she agreed with home health or declined home health. RNCM will continue to follow.

## 2015-04-10 NOTE — Consult Note (Signed)
ZJ:IRCVELF  HPI: Rachel Cox is an 64 y.o. female with history of seizures last one was in 2010. Was on Keppra 500 BID and was tapered down to 250 BID, until recently it was discontinued.  As per pt's husband pt has not have any side effects to Keppra.    Past Medical History  Diagnosis Date  . Allergy   . Hyperglycemia   . Hyperlipidemia   . Hypertension   . Hypothyroidism   . Insomnia   . Depression   . Anxiety   . Epilepsy ([place])     managed by Dr. Melrose Nakayama  . Anemia   . Osteopenia   . History of abnormal cervical Pap smear 1980's    Past Surgical History  Procedure Laterality Date  . Tubal ligation  1969  . Colonoscopy  Sept 2015  . Cryo procedure  1980s    for abnormal pap    Family History  Problem Relation Age of Onset  . Hyperlipidemia Mother   . Hypertension Mother   . CAD Mother   . COPD Father   . Cancer Brother     liver  . Alcohol abuse Brother   . Hypertension Brother   . Liver disease Brother   . Thyroid disease Mother   . Thyroid disease Father   . Thyroid disease Sister     Social History:  reports that she has never smoked. She has never used smokeless tobacco. She reports that she does not drink alcohol or use illicit drugs.  Allergies  Allergen Reactions  . Aspirin Nausea Only  . Codeine Palpitations    Pt vocalized    Medications: I have reviewed the patient's current medications.  ROS: History obtained from the patient  General ROS: negative for - chills, fatigue, fever, night sweats, weight gain or weight loss Psychological ROS: negative for - behavioral disorder, hallucinations, memory difficulties, mood swings or suicidal ideation Ophthalmic ROS: negative for - blurry vision, double vision, eye pain or loss of vision ENT ROS: negative for - epistaxis, nasal discharge, oral lesions, sore throat, tinnitus or vertigo Allergy and Immunology ROS: negative for - hives or itchy/watery eyes Hematological and Lymphatic ROS: negative for  - bleeding problems, bruising or swollen lymph nodes Endocrine ROS: negative for - galactorrhea, hair pattern changes, polydipsia/polyuria or temperature intolerance Respiratory ROS: negative for - cough, hemoptysis, shortness of breath or wheezing Cardiovascular ROS: negative for - chest pain, dyspnea on exertion, edema or irregular heartbeat Gastrointestinal ROS: negative for - abdominal pain, diarrhea, hematemesis, nausea/vomiting or stool incontinence Genito-Urinary ROS: negative for - dysuria, hematuria, incontinence or urinary frequency/urgency Musculoskeletal ROS: negative for - joint swelling or muscular weakness Neurological ROS: as noted in HPI Dermatological ROS: negative for rash and skin lesion changes  Physical Examination: Blood pressure 128/74, pulse 74, temperature 98 F (36.7 C), temperature source Oral, resp. rate 19, height 5\' 6"  (1.676 m), weight 179 lb (81.194 kg), SpO2 99 %.    Neurological Examination Mental Status: Alert to name, slight disorientation.   Cranial Nerves: II: Discs flat bilaterally; Visual fields grossly normal, pupils equal, round, reactive to light and accommodation III,IV, VI: ptosis not present, extra-ocular motions intact bilaterally V,VII: smile symmetric, facial light touch sensation normal bilaterally VIII: hearing normal bilaterally IX,X: gag reflex present XI: bilateral shoulder shrug XII: midline tongue extension Motor: Right : Upper extremity   5/5    Left:     Upper extremity   5/5  Lower extremity   5/5  Lower extremity   5/5 Tone and bulk:normal tone throughout; no atrophy noted Sensory: Pinprick and light touch intact throughout, bilaterally Deep Tendon Reflexes: 1+ and symmetric throughout Plantars: Right: downgoing   Left: downgoing Cerebellar: normal finger-to-nose, normal rapid alternating movements and normal heel-to-shin test Gait: not tested       Laboratory Studies:   Basic Metabolic Panel:  Recent  Labs Lab 04/09/15 0123 04/10/15 0324  NA 136 141  K 3.3* 3.2*  CL 104 111  CO2 24 25  GLUCOSE 136* 97  BUN 14 12  CREATININE 0.98 0.85  CALCIUM 9.8 9.0    Liver Function Tests:  Recent Labs Lab 04/09/15 0123  AST 22  ALT 15  ALKPHOS 63  BILITOT 0.4  PROT 7.0  ALBUMIN 4.1   No results for input(s): LIPASE, AMYLASE in the last 168 hours. No results for input(s): AMMONIA in the last 168 hours.  CBC:  Recent Labs Lab 04/09/15 0123 04/10/15 0324  WBC 6.9 5.9  NEUTROABS 3.8  --   HGB 7.4* 7.2*  HCT 25.1* 23.8*  MCV 65.9* 69.1*  PLT 395 267    Cardiac Enzymes: No results for input(s): CKTOTAL, CKMB, CKMBINDEX, TROPONINI in the last 168 hours.  BNP: Invalid input(s): POCBNP  CBG: No results for input(s): GLUCAP in the last 168 hours.  Microbiology: No results found for this or any previous visit.  Coagulation Studies:  Recent Labs  04/09/15 0123  LABPROT 12.3  INR 0.89    Urinalysis:   Recent Labs Lab 04/09/15 0500  COLORURINE YELLOW*  LABSPEC 1.012  PHURINE 5.0  GLUCOSEU NEGATIVE  HGBUR NEGATIVE  BILIRUBINUR NEGATIVE  KETONESUR NEGATIVE  PROTEINUR 30*  NITRITE NEGATIVE  LEUKOCYTESUR NEGATIVE    Lipid Panel:     Component Value Date/Time   CHOL 171 12/22/2014 1542   TRIG 67 12/22/2014 1542   HDL 92 12/22/2014 1542   LDLCALC 66 12/22/2014 1542    HgbA1C:  Lab Results  Component Value Date   HGBA1C 5.8* 12/22/2014    Urine Drug Screen:      Component Value Date/Time   LABOPIA NONE DETECTED 04/09/2015 0500   LABBENZ NONE DETECTED 04/09/2015 0500   AMPHETMU NONE DETECTED 04/09/2015 0500   THCU NONE DETECTED 04/09/2015 0500   LABBARB NONE DETECTED 04/09/2015 0500    Alcohol Level:   Recent Labs Lab 04/09/15 0123  ETH <5      Imaging: Ct Head Wo Contrast  04/09/2015  CLINICAL DATA:  Seizure. Recent changes and antiseizure medications. EXAM: CT HEAD WITHOUT CONTRAST TECHNIQUE: Contiguous axial images were obtained  from the base of the skull through the vertex without intravenous contrast. COMPARISON:  None. FINDINGS: There is no intracranial hemorrhage, mass or evidence of acute infarction. There is mild generalized atrophy. There is white matter hypodensity consistent with chronic small vessel disease. The no bony abnormalities evident. Visible paranasal sinuses are clear. IMPRESSION: Mild atrophy and chronic small vessel changes. Electronically Signed   By: Andreas Newport M.D.   On: 04/09/2015 02:54   Dg Chest Portable 1 View  04/09/2015  CLINICAL DATA:  Seizure. EXAM: PORTABLE CHEST 1 VIEW COMPARISON:  None. FINDINGS: A single AP portable view of the chest demonstrates no focal airspace consolidation or alveolar edema. The lungs are grossly clear. There is no large effusion or pneumothorax. Cardiac and mediastinal contours appear unremarkable. IMPRESSION: No active disease. Electronically Signed   By: Andreas Newport M.D.   On: 04/09/2015 04:15     Assessment/Plan:  64 y.o. female with history of seizures last one was in 2010. Was on Keppra 500 BID and was tapered down to 250 BID, until recently it was discontinued.  As per pt's husband pt has not have any side effects to Keppra.     - con't Keppra 500 BID - transfusion now - f/up dr Melrose Nakayama 2-4 weeks - d/w with family and pt at bedside.       Leotis Pain  04/10/2015, 2:31 PM

## 2015-04-10 NOTE — Progress Notes (Signed)
PT Cancellation Note  Patient Details Name: Rachel Cox MRN: [id number] DOB: [birth year]   Cancelled Treatment:    Reason Eval/Treat Not Completed: Medical issues which prohibited therapy (Chart reviewed for attempted evaluation.  Per primary RN, patient currently receiving blood transfusion for HgB 7.2.  Will hold therapy at this time and re-attempt next date as patient available and medically appropriate.)   Johnny Latu H. Owens Shark, PT, DPT, NCS 04/10/2015, 2:59 PM [phone]

## 2015-04-10 NOTE — Progress Notes (Signed)
[place] at [facility]                                                                                                                                                                                            Patient Demographics   Rachel Cox, is a 64 y.o. female, DOB - 10-Jan-1951, GYJ:[id number]  Admit date - 04/09/2015   Admitting Physician Harrie Foreman, MD  Outpatient Primary MD for the patient is Enid Derry, MD   LOS - 1  Subjective:  Patient continues to complain of feeling very weak but no further seizure episodes. She has cramping in her back. Denies any chest pain or shortness of breath hemoglobin is still very low at 7.3      Review of Systems:   CONSTITUTIONAL: No documented fever. Positive fatigue, positive weakness. No weight gain, no weight loss.  EYES: No blurry or double vision.  ENT: No tinnitus. No postnasal drip. No redness of the oropharynx.  RESPIRATORY: No cough, no wheeze, no hemoptysis. No dyspnea.  CARDIOVASCULAR: No chest pain. No orthopnea. No palpitations. No syncope.  GASTROINTESTINAL: No nausea, no vomiting or diarrhea. No abdominal pain. No melena or hematochezia.  GENITOURINARY: No dysuria or hematuria.  ENDOCRINE: No polyuria or nocturia. No heat or cold intolerance.  HEMATOLOGY: No anemia. No bruising. No bleeding.  INTEGUMENTARY: No rashes. No lesions.  MUSCULOSKELETAL: No arthritis. No swelling. No gout.  NEUROLOGIC: No numbness, tingling, or ataxia. No seizure-type activity.  PSYCHIATRIC: No anxiety. No insomnia. No ADD.    Vitals:   Filed Vitals:   04/10/15 0501 04/10/15 0721 04/10/15 1114 04/10/15 1331  BP: 115/68 113/69 132/77 125/64  Pulse: 72 69 73 85  Temp: 98.1 F (36.7 C) 98.7 F (37.1 C) 97.9 F (36.6 C) 99 F (37.2 C)  TempSrc: Oral Oral Oral Oral  Resp: 18 18  18   Height:      Weight: 81.194 kg (179 lb)     SpO2: 99% 96% 99% 100%    Wt Readings from Last 3 Encounters:   04/10/15 81.194 kg (179 lb)  03/02/15 73.936 kg (163 lb)  01/20/15 73.029 kg (161 lb)     Intake/Output Summary (Last 24 hours) at 04/10/15 1357 Last data filed at 04/10/15 1300  Gross per 24 hour  Intake    720 ml  Output    400 ml  Net    320 ml    Physical Exam:   GENERAL: Pleasant-appearing . Uncomfortable due to pain in the back HEAD, EYES, EARS, NOSE AND THROAT: Atraumatic, normocephalic. Extraocular muscles are intact. Pupils equal and reactive to  light. Sclerae anicteric. No conjunctival injection. No oro-pharyngeal erythema.  NECK: Supple. There is no jugular venous distention. No bruits, no lymphadenopathy, no thyromegaly.  HEART: Regular rate and rhythm,. No murmurs, no rubs, no clicks.  LUNGS: Clear to auscultation bilaterally. No rales or rhonchi. No wheezes.  ABDOMEN: Soft, flat, nontender, nondistended. Has good bowel sounds. No hepatosplenomegaly appreciated.  EXTREMITIES: No evidence of any cyanosis, clubbing, or peripheral edema.  +2 pedal and radial pulses bilaterally.  NEUROLOGIC: The patient is alert, awake, and oriented x3 with no focal motor or sensory deficits appreciated bilaterally.  SKIN: Moist and warm with no rashes appreciated.  Psych: Not anxious, depressed LN: No inguinal LN enlargement    Antibiotics   Anti-infectives    None      Medications   Scheduled Meds: . docusate sodium  100 mg Oral BID  . famotidine  20 mg Oral BID  . fluticasone  2 spray Each Nare Daily  . folic acid  841 mcg Oral Daily  . losartan  100 mg Oral Daily   And  . hydrochlorothiazide  25 mg Oral Daily  . levETIRAcetam  500 mg Oral BID  . levothyroxine  75 mcg Oral QAC breakfast  . simvastatin  40 mg Oral QHS  . vitamin B-12  400 mcg Oral Daily   Continuous Infusions: . sodium chloride 100 mL/hr at 04/10/15 0530   PRN Meds:.acetaminophen **OR** acetaminophen, morphine injection, ondansetron **OR** ondansetron (ZOFRAN) IV, oxyCODONE-acetaminophen   Data  Review:   Micro Results No results found for this or any previous visit (from the past 240 hour(s)).  Radiology Reports Ct Head Wo Contrast  04/09/2015  CLINICAL DATA:  Seizure. Recent changes and antiseizure medications. EXAM: CT HEAD WITHOUT CONTRAST TECHNIQUE: Contiguous axial images were obtained from the base of the skull through the vertex without intravenous contrast. COMPARISON:  None. FINDINGS: There is no intracranial hemorrhage, mass or evidence of acute infarction. There is mild generalized atrophy. There is white matter hypodensity consistent with chronic small vessel disease. The no bony abnormalities evident. Visible paranasal sinuses are clear. IMPRESSION: Mild atrophy and chronic small vessel changes. Electronically Signed   By: Andreas Newport M.D.   On: 04/09/2015 02:54   Dg Chest Portable 1 View  04/09/2015  CLINICAL DATA:  Seizure. EXAM: PORTABLE CHEST 1 VIEW COMPARISON:  None. FINDINGS: A single AP portable view of the chest demonstrates no focal airspace consolidation or alveolar edema. The lungs are grossly clear. There is no large effusion or pneumothorax. Cardiac and mediastinal contours appear unremarkable. IMPRESSION: No active disease. Electronically Signed   By: Andreas Newport M.D.   On: 04/09/2015 04:15     CBC  Recent Labs Lab 04/09/15 0123 04/10/15 0324  WBC 6.9 5.9  HGB 7.4* 7.2*  HCT 25.1* 23.8*  PLT 395 267  MCV 65.9* 69.1*  MCH 19.6* 21.1*  MCHC 29.7* 30.5*  RDW 18.0* 20.0*  LYMPHSABS 2.1  --   MONOABS 0.8  --   EOSABS 0.2  --   BASOSABS 0.1  --     Chemistries   Recent Labs Lab 04/09/15 0123 04/10/15 0324  NA 136 141  K 3.3* 3.2*  CL 104 111  CO2 24 25  GLUCOSE 136* 97  BUN 14 12  CREATININE 0.98 0.85  CALCIUM 9.8 9.0  AST 22  --   ALT 15  --   ALKPHOS 63  --   BILITOT 0.4  --    ------------------------------------------------------------------------------------------------------------------ estimated creatinine  clearance is  70 mL/min (by C-G formula based on Cr of 0.85). ------------------------------------------------------------------------------------------------------------------ No results for input(s): HGBA1C in the last 72 hours. ------------------------------------------------------------------------------------------------------------------ No results for input(s): CHOL, HDL, LDLCALC, TRIG, CHOLHDL, LDLDIRECT in the last 72 hours. ------------------------------------------------------------------------------------------------------------------  Recent Labs  04/08/15 0528  TSH 5.028*   ------------------------------------------------------------------------------------------------------------------  Recent Labs  04/08/15 0528  FERRITIN 4*  TIBC 473*  IRON 11*    Coagulation profile  Recent Labs Lab 04/09/15 0123  INR 0.89    No results for input(s): DDIMER in the last 72 hours.  Cardiac Enzymes No results for input(s): CKMB, TROPONINI, MYOGLOBIN in the last 168 hours.  Invalid input(s): CK ------------------------------------------------------------------------------------------------------------------ Invalid input(s): [place]   Assessment/Plan This is a 64 year old Caucasian female admitted for seizure disorder and anemia. 1. Seizure disorder: Seizure threshold lowered following discontinuation of antiepileptic medication. Patient resumed on Keppra. No further seizure activity 2. Anemia: Microcytic; appears to be iron deficient.severe iron deficiency anemia status post transfusion blood count still low and patient still symptomatic and we'll transfuse her 1 unit of packed RBC I will as GI to see the patient.  3. Hypertension: Continue Hyzaar 4. Hypothyroidism: Continue Synthroid.  5. Back pain no trauma or fall we'll try some pain medications 6. GI prophylaxis: H2 blocker per home regimen      Code Status Orders        Start      Ordered   04/09/15 0455  Full code   Continuous     04/09/15 0455           Consults neurology   DVT Prophylaxis  heparin   Lab Results  Component Value Date   PLT 267 04/10/2015     Time Spent in minutes   35 minutes    Dustin Flock M.D on 04/10/2015 at 1:57 PM  Between 7am to 6pm - Pager - [phone]  After 6pm go to [email or web address] - password [email or web address] [facility]   Office  [phone]

## 2015-04-11 ENCOUNTER — Ambulatory Visit: Payer: PPO | Admitting: Endocrinology

## 2015-04-11 LAB — TYPE AND SCREEN
ABO/RH(D): O NEG
Antibody Screen: NEGATIVE
UNIT DIVISION: 0
Unit division: 0

## 2015-04-11 LAB — CBC WITH DIFFERENTIAL/PLATELET
BASOS ABS: 0.1 10*3/uL (ref 0–0.1)
Eosinophils Absolute: 0.3 10*3/uL (ref 0–0.7)
Eosinophils Relative: 3 %
HEMATOCRIT: 30.4 % — AB (ref 35.0–47.0)
HEMOGLOBIN: 9.3 g/dL — AB (ref 12.0–16.0)
Lymphs Abs: 1.7 10*3/uL (ref 1.0–3.6)
MCH: 21.8 pg — ABNORMAL LOW (ref 26.0–34.0)
MCHC: 30.6 g/dL — ABNORMAL LOW (ref 32.0–36.0)
MCV: 71 fL — AB (ref 80.0–100.0)
Monocytes Absolute: 1 10*3/uL — ABNORMAL HIGH (ref 0.2–0.9)
Monocytes Relative: 13 %
NEUTROS ABS: 4.8 10*3/uL (ref 1.4–6.5)
Platelets: 314 10*3/uL (ref 150–440)
RBC: 4.28 MIL/uL (ref 3.80–5.20)
RDW: 22.4 % — ABNORMAL HIGH (ref 11.5–14.5)
WBC: 8 10*3/uL (ref 3.6–11.0)

## 2015-04-11 MED ORDER — OMEPRAZOLE 40 MG PO CPDR: 40.0000 mg | DELAYED_RELEASE_CAPSULE | Freq: Every day | ORAL | Status: DC

## 2015-04-11 MED ORDER — LEVETIRACETAM 500 MG PO TABS: 500.0000 mg | ORAL_TABLET | Freq: Two times a day (BID) | ORAL | Status: DC

## 2015-04-11 MED ORDER — FERROUS SULFATE 325 (65 FE) MG PO TABS: 325.0000 mg | ORAL_TABLET | Freq: Three times a day (TID) | ORAL | Status: DC

## 2015-04-11 NOTE — Evaluation (Signed)
Physical Therapy Evaluation Patient Details Name: Rachel Cox MRN: [id number] DOB: 12-01-1950 Today's Date: 04/11/2015   History of Present Illness  presented to ER secondary to witnessed seizure (approx 10 min) in home environment with two additional episodes in ED, requiring IV ativan for termination of seizure activity; admitted for management of post-ictal state and noted anemia.  Status post two units PRBC since admission.  Clinical Impression  Upon evaluation, patient alert and oriented to all information; follows all commands and demonstrates good insight/safety awareness with all functional activities.  Demonstrates strength and ROM grossly WFL and symmetrical throughout all extremities; no focal weakness or neurological deficit appreciated.  Currently able to complete all mobility (bed mobility, sit/stand, basic transfers and gait x200') without assist device, mod indep.  Demonstrates functional reach >8", able to safely retrieve item from floor and demonstrates self-selected gait speed approx 2.59ft/sec without balance loss or safety concern. Patient reports current functional status is baseline for her; reports no subjective change in status or ability.  Husband present to confirm. No skilled PT needs identified at this time; will complete initial order.  Please re-consult should needs change.    Follow Up Recommendations No PT follow up    Equipment Recommendations       Recommendations for Other Services       Precautions / Restrictions Precautions Precautions: Fall Restrictions Weight Bearing Restrictions: No      Mobility  Bed Mobility Overal bed mobility: Independent                Transfers Overall transfer level: Modified independent               General transfer comment: sit/stand and basic transfers without use of assist device, mod indep  Ambulation/Gait Ambulation/Gait assistance: Modified independent (Device/Increase time) Ambulation  Distance (Feet): 220 Feet Assistive device: None   Gait velocity: 10' walk time, 5-6 seconds   General Gait Details: reciprocal stepping pattern with good step height/length, good cadence and gait speed; able to complete dynamic gait components (head turns, start/stop and changes of direction) without LOB or safety concern  Stairs            Wheelchair Mobility    Modified Rankin (Stroke Patients Only)       Balance Overall balance assessment: Needs assistance Sitting-balance support: No upper extremity supported;Feet supported Sitting balance-Leahy Scale: Normal     Standing balance support: No upper extremity supported Standing balance-Leahy Scale: Good                 High Level Balance Comments: Standing functional reach >8"; able to retrieve item from floor without LOB or safety concern.   Good stability, good awareness of limits of stability.               Pertinent Vitals/Pain Pain Assessment: No/denies pain    Home Living Family/patient expects to be discharged to:: Private residence Living Arrangements: Spouse/significant other Available Help at Discharge: Family Type of Home: House Home Access: Stairs to enter Entrance Stairs-Rails: None Entrance Stairs-Number of Steps: 1 Home Layout: One level        Prior Function Level of Independence: Independent         Comments: Indep with all household/community activities; denies fall history.     Hand Dominance        Extremity/Trunk Assessment   Upper Extremity Assessment: Overall WFL for tasks assessed           Lower Extremity Assessment: Overall [facility]  for tasks assessed (grossly 4+ to 5/5 throughout; symmetrical without any focal weakness or neurological deficit appreciated)         Communication   Communication: No difficulties  Cognition Arousal/Alertness: Awake/alert Behavior During Therapy: WFL for tasks assessed/performed Overall Cognitive Status: Within Functional  Limits for tasks assessed                      General Comments      Exercises        Assessment/Plan    PT Assessment Patent does not need any further PT services  PT Diagnosis     PT Problem List    PT Treatment Interventions     PT Goals (Current goals can be found in the Care Plan section) Acute Rehab PT Goals Patient Stated Goal: "to go home today" PT Goal Formulation: All assessment and education complete, DC therapy Potential to Achieve Goals: Good    Frequency     Barriers to discharge        Co-evaluation               End of Session Equipment Utilized During Treatment: Gait belt Activity Tolerance: Patient tolerated treatment well Patient left: in bed;with call bell/phone within reach;with bed alarm set;with family/visitor present           Time: 0321-2248 PT Time Calculation (min) (ACUTE ONLY): 15 min   Charges:   PT Evaluation $Initial PT Evaluation Tier I: 1 Procedure     PT G Codes:        Rachel Cox, PT, DPT, NCS 04/11/2015, 8:50 AM [phone]

## 2015-04-11 NOTE — Care Management Important Message (Signed)
Important Message  Patient Details  Name: TRACI GAFFORD MRN: [id number] Date of Birth: 08/13/1950   Medicare Important Message Given:  Yes-second notification given    Marshell Garfinkel, RN 04/11/2015, 8:34 AM

## 2015-04-12 NOTE — Discharge Summary (Signed)
Rachel Cox, 64 y.o., DOB [birth year], MRN [id number]. Admission date: 04/09/2015 Discharge Date 04/12/2015 Primary MD Enid Derry, MD Admitting Physician Harrie Foreman, MD  Admission Diagnosis  Seizures [facility]) [R56.9] Symptomatic anemia [D64.9]  Discharge Diagnosis   Active Problems:   Seizure [facility])  iron def Anemia  Essential hypertension Hypothyroidism Back pain        Hospital Course  The patient presents emergency department following a seizure at home in her sleep. The patient has known seizure disorder but had not had any seizure activity for approximately 5 years. Her neurologist had gradually weaned her from her antiepileptic medication.  Patient was admitted for the seizure. She was seen by neurology and was started on Keppra. She had no further seizures in the hospital. On admission she was noted to have severe anemia. Guaiac negative. Patient received 2 units of packed RBCs hemoglobin is currently stable. She was seen by GI they recommended outpatient follow-up and endoscopy and colonoscopy.          Consults  GI, and neurology  Significant Tests:  See full reports for all details     Ct Head Wo Contrast  04/09/2015  CLINICAL DATA:  Seizure. Recent changes and antiseizure medications. EXAM: CT HEAD WITHOUT CONTRAST TECHNIQUE: Contiguous axial images were obtained from the base of the skull through the vertex without intravenous contrast. COMPARISON:  None. FINDINGS: There is no intracranial hemorrhage, mass or evidence of acute infarction. There is mild generalized atrophy. There is white matter hypodensity consistent with chronic small vessel disease. The no bony abnormalities evident. Visible paranasal sinuses are clear. IMPRESSION: Mild atrophy and chronic small vessel changes. Electronically Signed   By: Andreas Newport M.D.   On: 04/09/2015 02:54   Dg Chest Portable 1 View  04/09/2015  CLINICAL DATA:  Seizure. EXAM: PORTABLE CHEST 1 VIEW COMPARISON:  None.  FINDINGS: A single AP portable view of the chest demonstrates no focal airspace consolidation or alveolar edema. The lungs are grossly clear. There is no large effusion or pneumothorax. Cardiac and mediastinal contours appear unremarkable. IMPRESSION: No active disease. Electronically Signed   By: Andreas Newport M.D.   On: 04/09/2015 04:15       Today   Subjective:   Rachel Cox  patient feels well denies any complaints no chest pain or shortness of breath  Objective:   Blood pressure 140/52, pulse 74, temperature 98.1 F (36.7 C), temperature source Oral, resp. rate 18, height 5\' 6"  (1.676 m), weight 79.969 kg (176 lb 4.8 oz), SpO2 100 %.  . No intake or output data in the 24 hours ending 04/12/15 1253  Exam VITAL SIGNS: Blood pressure 140/52, pulse 74, temperature 98.1 F (36.7 C), temperature source Oral, resp. rate 18, height 5\' 6"  (1.676 m), weight 79.969 kg (176 lb 4.8 oz), SpO2 100 %.  GENERAL:  64 y.o.-year-old patient lying in the bed with no acute distress.  EYES: Pupils equal, round, reactive to light and accommodation. No scleral icterus. Extraocular muscles intact.  HEENT: Head atraumatic, normocephalic. Oropharynx and nasopharynx clear.  NECK:  Supple, no jugular venous distention. No thyroid enlargement, no tenderness.  LUNGS: Normal breath sounds bilaterally, no wheezing, rales,rhonchi or crepitation. No use of accessory muscles of respiration.  CARDIOVASCULAR: S1, S2 normal. No murmurs, rubs, or gallops.  ABDOMEN: Soft, nontender, nondistended. Bowel sounds present. No organomegaly or mass.  EXTREMITIES: No pedal edema, cyanosis, or clubbing.  NEUROLOGIC: Cranial nerves II through XII are intact. Muscle strength 5/5 in all extremities.  Sensation intact. Gait not checked.  PSYCHIATRIC: The patient is alert and oriented x 3.  SKIN: No obvious rash, lesion, or ulcer.   Data Review     CBC w Diff:  Lab Results  Component Value Date   WBC 8.0 04/11/2015   HGB  9.3* 04/11/2015   HCT 30.4* 04/11/2015   PLT 314 04/11/2015   LYMPHOPCT 22% 04/11/2015   MONOPCT 13% 04/11/2015   EOSPCT 3% 04/11/2015   BASOPCT 1% 04/11/2015   CMP:  Lab Results  Component Value Date   NA 141 04/10/2015   NA 140 03/02/2015   K 3.2* 04/10/2015   CL 111 04/10/2015   CO2 25 04/10/2015   BUN 12 04/10/2015   BUN 13 03/02/2015   CREATININE 0.85 04/10/2015   PROT 7.0 04/09/2015   PROT 6.5 03/02/2015   ALBUMIN 4.1 04/09/2015   ALBUMIN 4.1 03/02/2015   BILITOT 0.4 04/09/2015   BILITOT 0.4 03/02/2015   ALKPHOS 63 04/09/2015   AST 22 04/09/2015   ALT 15 04/09/2015  .  Micro Results No results found for this or any previous visit (from the past 240 hour(s)).         Follow-up Information    Follow up with Josefine Class, MD On 04/18/2015.   Specialty:  Gastroenterology   Why:  AT [place] information:   [place] [facility] [place] [address] [phone]       Follow up with Vickki Hearing, MD On 04/13/2015.   Specialty:  Neurology   Why:  AT [address]   Contact information:   [place] [facility] [place] [address] [phone]       Follow up with Enid Derry, MD On 04/20/2015.   Specialty:  Family Medicine   Why:  at [address]   Contact information:   [address] [place]  [address] [phone]       Discharge Medications     Medication List    TAKE these medications        aspirin EC 81 MG tablet  Take 81 mg by mouth daily.     ferrous sulfate 325 (65 FE) MG tablet  Take 1 tablet (325 mg total) by mouth 3 (three) times daily with meals.     fluticasone 50 MCG/ACT nasal spray  Commonly known as:  FLONASE  Place 2 sprays into both nostrils daily.     folic acid 559 MCG tablet  Commonly known as:  FOLVITE  Take 400 mcg by mouth daily.     levETIRAcetam 500 MG tablet  Commonly known as:  KEPPRA  Take 1 tablet (500 mg total) by mouth 2 (two) times  daily.     losartan-hydrochlorothiazide 100-25 MG tablet  Commonly known as:  HYZAAR  TAKE ONE TABLET BY MOUTH ONCE DAILY     omeprazole 40 MG capsule  Commonly known as:  PRILOSEC  Take 1 capsule (40 mg total) by mouth daily.     simvastatin 40 MG tablet  Commonly known as:  ZOCOR  Take 1 tablet (40 mg total) by mouth at bedtime.     VITAMIN B-12 PO  Take 400 mcg by mouth daily.           Total Time in preparing paper work, data evaluation and todays exam - 35 minutes  Dustin Flock M.D on 04/12/2015 at 12:53 PM  [facility]   Office  [phone]

## 2015-04-20 ENCOUNTER — Encounter: Payer: Self-pay | Admitting: Family Medicine

## 2015-04-20 ENCOUNTER — Ambulatory Visit (INDEPENDENT_AMBULATORY_CARE_PROVIDER_SITE_OTHER): Payer: PPO | Admitting: Family Medicine

## 2015-04-20 VITALS — BP 123/76 | HR 90 | Temp 97.7°F | Ht 66.0 in | Wt 162.0 lb

## 2015-04-20 DIAGNOSIS — G40802 Other epilepsy, not intractable, without status epilepticus: Secondary | ICD-10-CM

## 2015-04-20 DIAGNOSIS — R569 Unspecified convulsions: Secondary | ICD-10-CM | POA: Diagnosis not present

## 2015-04-20 DIAGNOSIS — E876 Hypokalemia: Secondary | ICD-10-CM | POA: Diagnosis not present

## 2015-04-20 DIAGNOSIS — D6489 Other specified anemias: Secondary | ICD-10-CM | POA: Diagnosis not present

## 2015-04-20 NOTE — Assessment & Plan Note (Addendum)
Check CBC today; she received two units PRBCs in the hospital; follow-up as outpatient with GI to find reason for the anemia; suspect chronic GI loss given extent of microcytosis and hypochromia; she is on iron supplementation; noted that hospitalist left patient on aspirin; guaiac was negative x 1 only; she will need to see GI for EGD and colonoscopy

## 2015-04-20 NOTE — Patient Instructions (Signed)
We'll contact you about the lab results Please do follow-up with out hospital instructions given to you at discharge

## 2015-04-20 NOTE — Progress Notes (Signed)
BP 123/76 mmHg  Pulse 90  Temp(Src) 97.7 F (36.5 C)  Ht 5\' 6"  (1.676 m)  Wt 162 lb (73.483 kg)  BMI 26.16 kg/m2  SpO2 100%   Subjective:    Patient ID: Rachel Cox, female    DOB: [birth year], 64 y.o.   MRN: [id number]  HPI: Rachel Cox is a 64 y.o. female  Chief Complaint  Patient presents with  . Hospitalization Follow-up    For seizures. Last seizure was 04/09/2015.    Was being weaned off of Keppra, then had a seizure 6 days later She is feeling better now; just sore; no headaches; no change in sense of taste or smell; no visual changes No breathing problems Appetite is fine She just saw neurologist She did not get up to 176 pounds (we reviewed her recent weights in Epic, and she does not believe that entry was correct) Got blood in the hospital for low blood count; going to have f/u as outpatient for anemia Low K+; no palpitations  Relevant past medical, surgical, family and social history reviewed and updated as indicated. Interim medical history since our last visit reviewed. Allergies and medications reviewed and updated.  Review of Systems No cough; she does not believe she aspirated Per HPI unless specifically indicated above  Hospital records reviewed Admitted on Nov 6th, discharged Nov 9th, care of hospitalists at [facility] [place] dx:  Seizures R56.9, symptomatic anemia D64.9 Patient had seizure at home; started on Keppra; found to be anemic; guaiac negative; patient received 2 units PRBCs, consulted GI in hospital and they recommend outpatient EGD and colonoscopy  CT scan IMPRESSION: Mild atrophy and chronic small vessel changes.  CXR: IMPRESSION: No active disease  LABS Nov 5th Ferritin 4 Iron 11, TIBC 473, Saturation ratio 2 TSH 5.028  Nov 6th CBC: H/H 7.4 / 25.1, MCV 65.9, MCH 19.6, RDW 18  Nov 7th CBC: H/H 7.2 / 23.8, MCV 69.1, MCH 21.1, RDW 20 BMP: K+ 3.2, creatinine 0.85, BUN 12 Sed rate 26  Nov 9th CBC: H/H 9.3 / 30.4,  MCV 74, MCH 21.8, RDW 22.4 Blood transfusion (PRBCs) given November 9th      Objective:    BP 123/76 mmHg  Pulse 90  Temp(Src) 97.7 F (36.5 C)  Ht 5\' 6"  (1.676 m)  Wt 162 lb (73.483 kg)  BMI 26.16 kg/m2  SpO2 100%  Wt Readings from Last 3 Encounters:  04/20/15 162 lb (73.483 kg)  04/11/15 176 lb 4.8 oz (79.969 kg)  03/02/15 163 lb (73.936 kg)   *patient does not believe the 176 pound weight documented in the hospital was correct* Physical Exam  Constitutional: She appears well-developed and well-nourished. No distress.  HENT:  Head: Normocephalic and atraumatic.  Eyes: EOM are normal. No scleral icterus.  Cardiovascular: Normal rate and regular rhythm.   Pulmonary/Chest: Effort normal and breath sounds normal. No respiratory distress. She has no decreased breath sounds. She has no rhonchi.  Abdominal: Soft. Normal appearance. There is no tenderness.  Musculoskeletal: She exhibits no edema.  Neurological: She is alert. She displays no tremor. She displays no seizure activity. Gait normal.  Skin: Skin is warm. No ecchymosis noted. No pallor.  Psychiatric: She has a normal mood and affect.  Good eye contact with examiner      Assessment & Plan:   Problem List Items Addressed This Visit      Nervous and Auditory   Epilepsy ([place])    Recently weaned off of AED and then experienced  seizure; hospitalized; head CT reviewed; back on AED; close f/u with neurologist        Other   Seizure [facility])    Experienced by patient six days after weaning off of her anti-epileptic drug; back on Keppra now, followed by neurologist      Anemia - Primary    Check CBC today; she received two units PRBCs in the hospital; follow-up as outpatient with GI to find reason for the anemia; suspect chronic GI loss given extent of microcytosis and hypochromia; she is on iron supplementation; noted that hospitalist left patient on aspirin; guaiac was negative x 1 only; she will need to see GI for EGD and  colonoscopy      Relevant Orders   CBC with Differential/Platelet (Completed)   Hypokalemia    Noted in the hospital; check K+ and Mg2+ today      Relevant Orders   Basic metabolic panel (Completed)   Magnesium (Completed)      Follow up plan: Return in about 4 weeks (around 05/18/2015) for follow-up anemia, blood pressure.  Orders Placed This Encounter  Procedures  . Basic metabolic panel  . CBC with Differential/Platelet  . Magnesium   An after-visit summary was printed and given to the patient at [facility].  Please see the patient instructions which may contain other information and recommendations beyond what is mentioned above in the assessment and plan.

## 2015-04-21 LAB — BASIC METABOLIC PANEL
BUN/Creatinine Ratio: 17 (ref 11–26)
BUN: 17 mg/dL (ref 8–27)
CO2: 24 mmol/L (ref 18–29)
Calcium: 10.3 mg/dL (ref 8.7–10.3)
Chloride: 97 mmol/L (ref 97–106)
Creatinine, Ser: 1.02 mg/dL — ABNORMAL HIGH (ref 0.57–1.00)
GFR calc non Af Amer: 57 mL/min/{1.73_m2} — ABNORMAL LOW (ref >59)
GFR, EST AFRICAN AMERICAN: 66 mL/min/{1.73_m2} (ref >59)
GLUCOSE: 100 mg/dL — AB (ref 65–99)
POTASSIUM: 4.5 mmol/L (ref 3.5–5.2)
SODIUM: 137 mmol/L (ref 136–144)

## 2015-04-21 LAB — CBC WITH DIFFERENTIAL/PLATELET
BASOS: 1 %
Basophils Absolute: 0.1 10*3/uL (ref 0.0–0.2)
EOS (ABSOLUTE): 0.1 10*3/uL (ref 0.0–0.4)
EOS: 2 %
HEMATOCRIT: 35.9 % (ref 34.0–46.6)
HEMOGLOBIN: 10.7 g/dL — AB (ref 11.1–15.9)
IMMATURE GRANS (ABS): 0 10*3/uL (ref 0.0–0.1)
IMMATURE GRANULOCYTES: 0 %
LYMPHS: 32 %
Lymphocytes Absolute: 2.2 10*3/uL (ref 0.7–3.1)
MCH: 23.1 pg — ABNORMAL LOW (ref 26.6–33.0)
MCHC: 29.8 g/dL — ABNORMAL LOW (ref 31.5–35.7)
MCV: 77 fL — AB (ref 79–97)
Monocytes Absolute: 0.9 10*3/uL (ref 0.1–0.9)
Monocytes: 14 %
NEUTROS ABS: 3.5 10*3/uL (ref 1.4–7.0)
Neutrophils: 51 %
Platelets: 417 10*3/uL — ABNORMAL HIGH (ref 150–379)
RBC: 4.64 x10E6/uL (ref 3.77–5.28)
RDW: 24.5 % — AB (ref 12.3–15.4)
WBC: 6.9 10*3/uL (ref 3.4–10.8)

## 2015-04-21 LAB — MAGNESIUM: Magnesium: 2.2 mg/dL (ref 1.6–2.3)

## 2015-04-25 NOTE — Assessment & Plan Note (Signed)
Noted in the hospital; check K+ and Mg2+ today

## 2015-04-25 NOTE — Assessment & Plan Note (Signed)
Recently weaned off of AED and then experienced seizure; hospitalized; head CT reviewed; back on AED; close f/u with neurologist

## 2015-04-25 NOTE — Assessment & Plan Note (Signed)
Experienced by patient six days after weaning off of her anti-epileptic drug; back on Keppra now, followed by neurologist

## 2015-05-09 ENCOUNTER — Encounter: Payer: Self-pay | Admitting: Endocrinology

## 2015-05-09 ENCOUNTER — Ambulatory Visit (INDEPENDENT_AMBULATORY_CARE_PROVIDER_SITE_OTHER): Payer: PPO | Admitting: Endocrinology

## 2015-05-09 VITALS — BP 134/84 | HR 82 | Temp 97.7°F | Ht 66.0 in | Wt 163.0 lb

## 2015-05-09 DIAGNOSIS — E039 Hypothyroidism, unspecified: Secondary | ICD-10-CM

## 2015-05-09 LAB — TSH: TSH: 1.33 u[IU]/mL (ref 0.35–4.50)

## 2015-05-09 NOTE — Progress Notes (Signed)
Subjective:    Patient ID: Rachel Cox, female    DOB: [birth year], 64 y.o.   MRN: [id number]  HPI Pt returns for f/u of chronic primary hypothyroidism (dx'ed 2008; she has been on prescribed thyroid hormone therapy since then; she was on synthroid 150 mcg/day at one point, but she now requires less; US shows 1 small nodule).   Last month, she was in the hospital for seizure.  Since then, pt states she feels better in general. Past Medical History  Diagnosis Date  . Allergy   . Hyperglycemia   . Hyperlipidemia   . Hypertension   . Hypothyroidism   . Insomnia   . Depression   . Anxiety   . Epilepsy ([place])     managed by Dr. Melrose Nakayama  . Anemia   . Osteopenia   . History of abnormal cervical Pap smear 1980's    Past Surgical History  Procedure Laterality Date  . Tubal ligation  1969  . Colonoscopy  Sept 2015  . Cryo procedure  1980s    for abnormal pap    Social History   Social History  . Marital Status: Married    Spouse Name: N/A  . Number of Children: N/A  . Years of Education: N/A   Occupational History  . Not on file.   Social History Main Topics  . Smoking status: Never Smoker   . Smokeless tobacco: Never Used  . Alcohol Use: No  . Drug Use: No  . Sexual Activity: Yes    Birth Control/ Protection: Post-menopausal   Other Topics Concern  . Not on file   Social History Narrative    Current Outpatient Prescriptions on File Prior to Visit  Medication Sig Dispense Refill  . aspirin EC 81 MG tablet Take 81 mg by mouth daily.    . Cyanocobalamin (VITAMIN B-12 PO) Take 400 mcg by mouth daily.     . ferrous sulfate 325 (65 FE) MG tablet Take 1 tablet (325 mg total) by mouth 3 (three) times daily with meals. 90 tablet 3  . fluticasone (FLONASE) 50 MCG/ACT nasal spray Place 2 sprays into both nostrils daily.    . folic acid (FOLVITE) A999333 MCG tablet Take 400 mcg by mouth daily.    Marland Kitchen levETIRAcetam (KEPPRA) 500 MG tablet Take 1 tablet (500 mg total) by mouth 2  (two) times daily. 60 tablet 0  . losartan-hydrochlorothiazide (HYZAAR) 100-25 MG tablet TAKE ONE TABLET BY MOUTH ONCE DAILY 90 tablet 1  . omeprazole (PRILOSEC) 40 MG capsule Take 1 capsule (40 mg total) by mouth daily. 30 capsule 0  . simvastatin (ZOCOR) 40 MG tablet Take 1 tablet (40 mg total) by mouth at bedtime. 90 tablet 1   No current facility-administered medications on file prior to visit.    Allergies  Allergen Reactions  . Aspirin Nausea Only  . Codeine Palpitations    Pt vocalized    Family History  Problem Relation Age of Onset  . Hyperlipidemia Mother   . Hypertension Mother   . CAD Mother   . COPD Father   . Cancer Brother     liver  . Alcohol abuse Brother   . Hypertension Brother   . Liver disease Brother   . Thyroid disease Mother   . Thyroid disease Father   . Thyroid disease Sister     BP 134/84 mmHg  Pulse 82  Temp(Src) 97.7 F (36.5 C) (Oral)  Ht 5\' 6"  (1.676 m)  Wt 163 lb (73.936  kg)  BMI 26.32 kg/m2  SpO2 99%  Review of Systems Denies weight change.    Objective:   Physical Exam VITAL SIGNS:  See vs page GENERAL: no distress NECK: i do not appreciate the nodule    Lab Results  Component Value Date   TSH 1.33 05/09/2015   T3TOTAL 123 12/19/2014   T4TOTAL 14.2* 12/19/2014      Assessment & Plan:  Hypothyroidism, well-replaced.  Patient is advised the following: Patient Instructions  You seem to need less thyroid medication than you used to.  This may be due to your having 2 different types of thyroid problems--chronic inflammation which causes a low thyroid, and lumps, which can cause a high thyroid.  We'll have to follow your blood tests to tell.   A thyroid blood test is requested for you today.  We'll let you know about the results.  Please come back for a follow-up appointment in 6 months.    addendum: Please continue the same synthroid.

## 2015-05-09 NOTE — Patient Instructions (Addendum)
You seem to need less thyroid medication than you used to.  This may be due to your having 2 different types of thyroid problems--chronic inflammation which causes a low thyroid, and lumps, which can cause a high thyroid.  We'll have to follow your blood tests to tell.   A thyroid blood test is requested for you today.  We'll let you know about the results.  Please come back for a follow-up appointment in 6 months.    

## 2015-05-10 ENCOUNTER — Other Ambulatory Visit: Payer: Self-pay

## 2015-05-10 MED ORDER — LEVOTHYROXINE SODIUM 75 MCG PO TABS: 75.0000 ug | ORAL_TABLET | Freq: Every day | ORAL | Status: DC

## 2015-05-18 ENCOUNTER — Ambulatory Visit: Payer: PPO | Admitting: Family Medicine

## 2015-07-04 ENCOUNTER — Encounter: Payer: Self-pay | Admitting: Family Medicine

## 2015-07-04 ENCOUNTER — Ambulatory Visit (INDEPENDENT_AMBULATORY_CARE_PROVIDER_SITE_OTHER): Payer: PPO | Admitting: Family Medicine

## 2015-07-04 VITALS — BP 128/79 | HR 72 | Temp 97.9°F | Wt 164.0 lb

## 2015-07-04 DIAGNOSIS — E559 Vitamin D deficiency, unspecified: Secondary | ICD-10-CM | POA: Diagnosis not present

## 2015-07-04 DIAGNOSIS — E039 Hypothyroidism, unspecified: Secondary | ICD-10-CM | POA: Diagnosis not present

## 2015-07-04 DIAGNOSIS — Z23 Encounter for immunization: Secondary | ICD-10-CM | POA: Diagnosis not present

## 2015-07-04 DIAGNOSIS — Z5181 Encounter for therapeutic drug level monitoring: Secondary | ICD-10-CM

## 2015-07-04 DIAGNOSIS — R739 Hyperglycemia, unspecified: Secondary | ICD-10-CM

## 2015-07-04 DIAGNOSIS — E785 Hyperlipidemia, unspecified: Secondary | ICD-10-CM

## 2015-07-04 DIAGNOSIS — D6489 Other specified anemias: Secondary | ICD-10-CM

## 2015-07-04 NOTE — Assessment & Plan Note (Signed)
Check labs today.

## 2015-07-04 NOTE — Assessment & Plan Note (Signed)
Check lipids today 

## 2015-07-04 NOTE — Patient Instructions (Addendum)
You have received the PPSV-23 today and you will not need another pneumonia vaccine for as long as you live according to the current ACIP guidelines We'll contact you about your labs Continue same medicines for now Try to limit saturated fats in your diet (bologna, hot dogs, barbeque, cheeseburgers, hamburgers, steak, bacon, sausage, cheese, etc.) and get more fresh fruits, vegetables, and whole grains Schedule next Medicare visit at [place] next DEXA scan will be in June of this year

## 2015-07-04 NOTE — Assessment & Plan Note (Signed)
She never followed up with GI after her transfusion; tired of doctors she says, didn't think she needed it; will check labs today

## 2015-07-04 NOTE — Assessment & Plan Note (Signed)
Managed now by endocrinologist; appreciate colleague's help

## 2015-07-04 NOTE — Assessment & Plan Note (Signed)
Check A1C and fasting glucose 

## 2015-07-04 NOTE — Progress Notes (Signed)
BP 128/79 mmHg  Pulse 72  Temp(Src) 97.9 F (36.6 C)  Wt 164 lb (74.39 kg)  SpO2 99%   Subjective:    Patient ID: Rachel Cox, female    DOB: 1950/12/19, 65 y.o.   MRN: [id number]  HPI: Rachel Cox is a 65 y.o. female  Chief Complaint  Patient presents with  . Hypertension    routine follow up and labs  . Hyperlipidemia    routine follow up and labs  . Anemia    routine follow up and labs  . Hypothyroidism    followed by Endocrine  . Immunizations    She is willing to get a pneumonia vaccine, she has had Prevnar already, will do PPSV 23.    She saw the endocrinologist; no biopsies or scans, just labs; she goes back to see them again in June; they left the medicine alone  High cholesterol; taking statin; 3-4 eggs a week; not much processed pork; does eat hamburger occasionally; not much red meat overall; no body aches  Anemia; not sure why anemic; she never followed up with the GI specialist for EGD or colonoscopy after her GI bleed and transfusions; she says she feels fine and just got tired of seeing doctors; no blood in the urine or the stool; no noselbeeds or gum bleeding; taking iron daily; just had colonoscopy in Sept 2015 and they told her to come back in five years; she does not hurt anywhere  She has high blood pressure; controlled today; she does check away from here and numbers are similar; she likes salt, but not crazy and does not go overboard; no decongestants  Allergies; using nasal spray Rx  Relevant past medical, surgical, family and social history reviewed and updated as indicated. Interim medical history since our last visit reviewed. Allergies and medications reviewed and updated.  Review of Systems Per HPI unless specifically indicated above     Objective:    BP 128/79 mmHg  Pulse 72  Temp(Src) 97.9 F (36.6 C)  Wt 164 lb (74.39 kg)  SpO2 99%  Wt Readings from Last 3 Encounters:  07/04/15 164 lb (74.39 kg)  05/09/15 163 lb (73.936 kg)   04/20/15 162 lb (73.483 kg)    Physical Exam  Constitutional: She appears well-developed and well-nourished. No distress.  HENT:  Head: Normocephalic and atraumatic.  Eyes: EOM are normal. No scleral icterus.  Neck: No thyromegaly present.  Cardiovascular: Normal rate, regular rhythm and normal heart sounds.   No murmur heard. Pulmonary/Chest: Effort normal and breath sounds normal. No respiratory distress. She has no wheezes.  Abdominal: Soft. Bowel sounds are normal. She exhibits no distension.  Musculoskeletal: Normal range of motion. She exhibits no edema.  Neurological: She is alert. She exhibits normal muscle tone.  Skin: Skin is warm and dry. She is not diaphoretic. No pallor.  Psychiatric: She has a normal mood and affect. Her behavior is normal. Judgment and thought content normal.   Results for orders placed or performed in visit on 05/09/15  TSH  Result Value Ref Range   TSH 1.33 0.35 - 4.50 uIU/mL      Assessment & Plan:   Problem List Items Addressed This Visit      Endocrine   Hypothyroidism    Managed now by endocrinologist; appreciate colleague's help        Other   Hyperglycemia    Check A1C and fasting glucose      Relevant Orders   Hgb A1c w/o eAG  Hyperlipidemia    Check lipids today      Relevant Orders   Lipid Panel w/o Chol/HDL Ratio   Vitamin D deficiency    Check vit D level and supplement if needed      Relevant Orders   VITAMIN D 25 Hydroxy (Vit-D Deficiency, Fractures)   Medication monitoring encounter    Check labs today      Relevant Orders   Comprehensive metabolic panel   Anemia - Primary    She never followed up with GI after her transfusion; tired of doctors she says, didn't think she needed it; will check labs today      Relevant Orders   CBC with Differential/Platelet   Ferritin    Other Visit Diagnoses    Need for pneumococcal vaccination        Relevant Orders    Pneumococcal polysaccharide vaccine 23-valent  greater than or equal to 2yo subcutaneous/IM (Completed)       Follow up plan: Return when due, for Medicare visit at [facility].  An after-visit summary was printed and given to the patient at [facility].  Please see the patient instructions which may contain other information and recommendations beyond what is mentioned above in the assessment and plan. Orders Placed This Encounter  Procedures  . Pneumococcal polysaccharide vaccine 23-valent greater than or equal to 2yo subcutaneous/IM  . CBC with Differential/Platelet  . Comprehensive metabolic panel  . Lipid Panel w/o Chol/HDL Ratio  . Hgb A1c w/o eAG  . VITAMIN D 25 Hydroxy (Vit-D Deficiency, Fractures)  . Ferritin

## 2015-07-04 NOTE — Assessment & Plan Note (Signed)
Check vit D level and supplement if needed 

## 2015-07-05 LAB — LIPID PANEL W/O CHOL/HDL RATIO
Cholesterol, Total: 208 mg/dL — ABNORMAL HIGH (ref 100–199)
HDL: 90 mg/dL (ref >39)
LDL CALC: 98 mg/dL (ref 0–99)
Triglycerides: 102 mg/dL (ref 0–149)
VLDL CHOLESTEROL CAL: 20 mg/dL (ref 5–40)

## 2015-07-05 LAB — CBC WITH DIFFERENTIAL/PLATELET
BASOS ABS: 0.1 10*3/uL (ref 0.0–0.2)
Basos: 1 %
EOS (ABSOLUTE): 0.2 10*3/uL (ref 0.0–0.4)
Eos: 4 %
HEMOGLOBIN: 14.3 g/dL (ref 11.1–15.9)
Hematocrit: 43.7 % (ref 34.0–46.6)
IMMATURE GRANS (ABS): 0 10*3/uL (ref 0.0–0.1)
IMMATURE GRANULOCYTES: 0 %
LYMPHS: 37 %
Lymphocytes Absolute: 1.7 10*3/uL (ref 0.7–3.1)
MCH: 28.9 pg (ref 26.6–33.0)
MCHC: 32.7 g/dL (ref 31.5–35.7)
MCV: 88 fL (ref 79–97)
MONOCYTES: 12 %
Monocytes Absolute: 0.5 10*3/uL (ref 0.1–0.9)
NEUTROS ABS: 2.1 10*3/uL (ref 1.4–7.0)
NEUTROS PCT: 46 %
PLATELETS: 309 10*3/uL (ref 150–379)
RBC: 4.95 x10E6/uL (ref 3.77–5.28)
RDW: 18.1 % — ABNORMAL HIGH (ref 12.3–15.4)
WBC: 4.7 10*3/uL (ref 3.4–10.8)

## 2015-07-05 LAB — COMPREHENSIVE METABOLIC PANEL
ALBUMIN: 4.2 g/dL (ref 3.6–4.8)
ALT: 17 IU/L (ref 0–32)
AST: 21 IU/L (ref 0–40)
Albumin/Globulin Ratio: 1.4 (ref 1.1–2.5)
Alkaline Phosphatase: 87 IU/L (ref 39–117)
BILIRUBIN TOTAL: 0.5 mg/dL (ref 0.0–1.2)
BUN / CREAT RATIO: 15 (ref 11–26)
BUN: 16 mg/dL (ref 8–27)
CALCIUM: 10 mg/dL (ref 8.7–10.3)
CHLORIDE: 100 mmol/L (ref 96–106)
CO2: 28 mmol/L (ref 18–29)
CREATININE: 1.08 mg/dL — AB (ref 0.57–1.00)
GFR calc non Af Amer: 54 mL/min/{1.73_m2} — ABNORMAL LOW (ref >59)
GFR, EST AFRICAN AMERICAN: 62 mL/min/{1.73_m2} (ref >59)
GLUCOSE: 96 mg/dL (ref 65–99)
Globulin, Total: 2.9 g/dL (ref 1.5–4.5)
Potassium: 3.8 mmol/L (ref 3.5–5.2)
Sodium: 142 mmol/L (ref 134–144)
TOTAL PROTEIN: 7.1 g/dL (ref 6.0–8.5)

## 2015-07-05 LAB — HGB A1C W/O EAG: Hgb A1c MFr Bld: 5.4 % (ref 4.8–5.6)

## 2015-07-05 LAB — FERRITIN: FERRITIN: 22 ng/mL (ref 15–150)

## 2015-07-05 LAB — VITAMIN D 25 HYDROXY (VIT D DEFICIENCY, FRACTURES): VIT D 25 HYDROXY: 29.2 ng/mL — AB (ref 30.0–100.0)

## 2015-07-06 ENCOUNTER — Telehealth: Payer: Self-pay | Admitting: Family Medicine

## 2015-07-06 MED ORDER — LOSARTAN POTASSIUM-HCTZ 100-25 MG PO TABS: 1.0000 | ORAL_TABLET | Freq: Every day | ORAL | Status: DC

## 2015-07-06 MED ORDER — SIMVASTATIN 40 MG PO TABS: 40.0000 mg | ORAL_TABLET | Freq: Every day | ORAL | Status: DC

## 2015-07-06 MED ORDER — FERROUS SULFATE 325 (65 FE) MG PO TABS: 325.0000 mg | ORAL_TABLET | Freq: Every day | ORAL | Status: DC

## 2015-07-06 NOTE — Telephone Encounter (Signed)
I spoke with patient Double up on vitamin D 1000 daily (2000 iu daily) for one month CBC and ferritin and everything looking better Avoid NSAIDs Continue iron daily x 2 months and then stop Cholesterol a little worse, she splurged over Christmas holidays

## 2015-07-24 ENCOUNTER — Ambulatory Visit: Payer: PPO | Admitting: Endocrinology

## 2015-09-12 ENCOUNTER — Encounter: Payer: PPO | Admitting: Family Medicine

## 2015-10-17 DIAGNOSIS — I10 Essential (primary) hypertension: Secondary | ICD-10-CM | POA: Diagnosis not present

## 2015-10-17 DIAGNOSIS — R569 Unspecified convulsions: Secondary | ICD-10-CM | POA: Diagnosis not present

## 2015-11-03 ENCOUNTER — Encounter: Payer: Self-pay | Admitting: Family Medicine

## 2015-11-03 ENCOUNTER — Ambulatory Visit (INDEPENDENT_AMBULATORY_CARE_PROVIDER_SITE_OTHER): Payer: PPO | Admitting: Family Medicine

## 2015-11-03 VITALS — BP 124/80 | HR 93 | Temp 98.1°F | Resp 16 | Wt 173.0 lb

## 2015-11-03 DIAGNOSIS — Z0000 Encounter for general adult medical examination without abnormal findings: Secondary | ICD-10-CM | POA: Diagnosis not present

## 2015-11-03 DIAGNOSIS — Z1239 Encounter for other screening for malignant neoplasm of breast: Secondary | ICD-10-CM | POA: Diagnosis not present

## 2015-11-03 DIAGNOSIS — M8580 Other specified disorders of bone density and structure, unspecified site: Secondary | ICD-10-CM

## 2015-11-03 NOTE — Patient Instructions (Addendum)
Please do call to schedule your mammogram; the number to schedule one at either [facility] or [facility] is [phone]  Please do call to schedule your bone density study; the number to schedule one at either [facility] or [facility] is [phone]  Health Maintenance  Topic Date Due  . MAMMOGRAM  07/03/2016 (Originally 11/30/2014)  . TETANUS/TDAP  07/03/2016 (Originally 07/04/2008)  . DEXA SCAN  11/30/2015  . INFLUENZA VACCINE  01/02/2016  . COLONOSCOPY  02/23/2019  . ZOSTAVAX  Completed  . Hepatitis C Screening  Addressed  . PNA vac Low Risk Adult  Completed  Mammogram is actually due NOW

## 2015-11-03 NOTE — Assessment & Plan Note (Signed)
Ordered DEXA; fall precautions; three servings of calcium a day; 1000 iu vit D3 daily

## 2015-11-03 NOTE — Progress Notes (Signed)
Patient: Rachel Cox, Female    DOB: July 25, 1950, 65 y.o.   MRN: [id number]  Visit Date: 11/03/2015  Today's Provider: Enid Derry, MD   Chief Complaint  Patient presents with  . Annual Exam    Medicare wellness    Subjective:   Rachel Cox is a 65 y.o. female who presents today for her Subsequent Annual Wellness Visit.  Caregiver input:  N/a  USPSTF grade A and B recommendations Alcohol: rare, once a month Depression:  Depression screen [facility] 2/9 11/03/2015 03/02/2015  Decreased Interest 0 0  Down, Depressed, Hopeless 0 0  PHQ - 2 Score 0 0  Hypertension: well-controlled Obesity: going up a little; maybe thyroid related, Dr. Renato Shin Tobacco use: nonsmoker HIV, hep B, hep C: UTD, declined by pt STD testing and prevention (chl/gon/syphilis): no sx Lipids: just done in Jan Glucose: normal at 96 in Jan Colorectal cancer: Sept 2015 Breast cancer: June 2015; ordered and suggested BRCA gene screening: no ovarian cancer Intimate partner violence: no abuse Cervical cancer screening: aged out; last 3+ paps all normal, all since the 1980s Lung cancer: n/a Osteoporosis: June 2015 Fall prevention/vitamin D: discussed AAA: no fam hx Aspirin: every other day, upsets stomach Diet: good eater Exercise: trying, has fit bit Skin cancer: no worrisome moles  HPI  Review of Systems  Past Medical History  Diagnosis Date  . Allergy   . Hyperglycemia   . Hyperlipidemia   . Hypertension   . Hypothyroidism   . Insomnia   . Depression   . Anxiety   . Epilepsy ([place])     managed by Dr. Melrose Nakayama  . Anemia   . Osteopenia   . History of abnormal cervical Pap smear 1980's   Past Surgical History  Procedure Laterality Date  . Tubal ligation  1969  . Colonoscopy  Sept 2015  . Cryo procedure  1980s    for abnormal pap   Family History  Problem Relation Age of Onset  . Hyperlipidemia Mother   . Hypertension Mother   . CAD Mother   . Thyroid disease Mother   . Heart disease  Mother   . COPD Father   . Thyroid disease Father   . Cancer Brother     liver  . Alcohol abuse Brother   . Hypertension Brother   . Liver disease Brother   . Thyroid disease Sister   . Diabetes Neg Hx   . Stroke Neg Hx    Social History   Social History  . Marital Status: Married    Spouse Name: N/A  . Number of Children: N/A  . Years of Education: N/A   Occupational History  . Not on file.   Social History Main Topics  . Smoking status: Never Smoker   . Smokeless tobacco: Never Used  . Alcohol Use: No  . Drug Use: No  . Sexual Activity: Yes    Birth Control/ Protection: Post-menopausal   Other Topics Concern  . Not on file   Social History Narrative   Outpatient Encounter Prescriptions as of 11/03/2015  Medication Sig  . aspirin EC 81 MG tablet Take 81 mg by mouth daily.  . Cyanocobalamin (VITAMIN B-12 PO) Take 400 mcg by mouth daily.   . ferrous sulfate 325 (65 FE) MG tablet Take 1 tablet (325 mg total) by mouth daily.  . fluticasone (FLONASE) 50 MCG/ACT nasal spray Place 2 sprays into both nostrils daily.  . folic acid (FOLVITE) 562 MCG tablet Take 400 mcg by  mouth daily.  Marland Kitchen levETIRAcetam (KEPPRA) 500 MG tablet Take 1 tablet (500 mg total) by mouth 2 (two) times daily.  Marland Kitchen levothyroxine (SYNTHROID, LEVOTHROID) 75 MCG tablet Take 1 tablet (75 mcg total) by mouth daily before breakfast.  . losartan-hydrochlorothiazide (HYZAAR) 100-25 MG tablet Take 1 tablet by mouth daily.  . simvastatin (ZOCOR) 40 MG tablet Take 1 tablet (40 mg total) by mouth at bedtime.   No facility-administered encounter medications on file as of 11/03/2015.   Functional Ability / Safety Screening 1.  Was the timed Get Up and Go test longer than 30 seconds?  no 2.  Does the patient need help with the phone, transportation, shopping,      preparing meals, housework, laundry, medications, or managing money?  no 3.  Does the patient's home have:  loose throw rugs in the hallway?   no      Grab  bars in the bathroom? yes      Handrails on the stairs?   no stairs inside; outside has rails      Poor lighting?   no 4.  Has the patient noticed any hearing difficulties?   no  Fall Risk Assessment See under rooming  Depression Screen See under rooming Depression screen [facility] 2/9 11/03/2015 03/02/2015  Decreased Interest 0 0  Down, Depressed, Hopeless 0 0  PHQ - 2 Score 0 0   Advanced Directives Does patient have a HCPOA?    no If yes, name and contact information: n/a Does patient have a living will or MOST form?  no, fixing to do with lawyer  Objective:   Vitals: BP 124/80 mmHg  Pulse 93  Temp(Src) 98.1 F (36.7 C) (Oral)  Resp 16  Wt 173 lb (78.472 kg)  SpO2 98% Body mass index is 27.94 kg/(m^2). No exam data present  Physical Exam Mood/affect:  euthymic Appearance:  Neatly dressed, casual  Cognitive Testing - 6-CIT  Correct? Score   What year is it? yes 0 Yes = 0    No = 4  What month is it? yes 0 Yes = 0    No = 3  Remember:     Rachel Cox, [place], [place]     What time is it? yes 0 Yes = 0    No = 3  Count backwards from 20 to 1 yes 0 Correct = 0    1 error = 2   More than 1 error = 4  Say the months of the year in reverse. Missed August 2 Correct = 0    1 error = 2   More than 1 error = 4  What address did I ask you to remember? Missed street number and name  4 Correct = 0  1 error = 2    2 error = 4    3 error = 6    4 error = 8    All wrong = 10       TOTAL SCORE  6/28   Interpretation:  Normal  Normal (0-7) Abnormal (8-28)    Assessment & Plan:     Annual Wellness Visit  Reviewed patient's Family Medical History Reviewed and updated list of patient's medical providers Assessment of cognitive impairment was done; she will practice on registration and recall Assessed patient's functional ability; independent Established a written schedule for health screening [place] Completed and Reviewed  Exercise Activities and  Dietary recommendations Goals    None    work  on modest weight loss  Immunization History  Administered Date(s) Administered  . Influenza,inj,Quad PF,36+ Mos 03/02/2015  . Pneumococcal Conjugate-13 11/15/2013  . Pneumococcal Polysaccharide-23 07/04/2015  . Td 07/04/1998  . Zoster 06/03/2010    Health Maintenance  Topic Date Due  . MAMMOGRAM  07/03/2016 (Originally 11/30/2014)  . TETANUS/TDAP  07/03/2016 (Originally 07/04/2008)  . DEXA SCAN  11/30/2015  . INFLUENZA VACCINE  01/02/2016  . COLONOSCOPY  02/23/2019  . ZOSTAVAX  Completed  . Hepatitis C Screening  Addressed  . PNA vac Low Risk Adult  Completed   Discussed health benefits of physical activity, and encouraged her to engage in regular exercise appropriate for her age and condition.    Current outpatient prescriptions:  .  aspirin EC 81 MG tablet, Take 81 mg by mouth daily., Disp: , Rfl:  .  Cyanocobalamin (VITAMIN B-12 PO), Take 400 mcg by mouth daily. , Disp: , Rfl:  .  ferrous sulfate 325 (65 FE) MG tablet, Take 1 tablet (325 mg total) by mouth daily., Disp: 30 tablet, Rfl: 1 .  fluticasone (FLONASE) 50 MCG/ACT nasal spray, Place 2 sprays into both nostrils daily., Disp: , Rfl:  .  folic acid (FOLVITE) 864 MCG tablet, Take 400 mcg by mouth daily., Disp: , Rfl:  .  levETIRAcetam (KEPPRA) 500 MG tablet, Take 1 tablet (500 mg total) by mouth 2 (two) times daily., Disp: 60 tablet, Rfl: 0 .  levothyroxine (SYNTHROID, LEVOTHROID) 75 MCG tablet, Take 1 tablet (75 mcg total) by mouth daily before breakfast., Disp: 30 tablet, Rfl: 5 .  losartan-hydrochlorothiazide (HYZAAR) 100-25 MG tablet, Take 1 tablet by mouth daily., Disp: 90 tablet, Rfl: 1 .  simvastatin (ZOCOR) 40 MG tablet, Take 1 tablet (40 mg total) by mouth at bedtime., Disp: 90 tablet, Rfl: 1 There are no discontinued medications.  Next Medicare Wellness Visit in 12+ months  Orders Placed This Encounter  Procedures  . MM DIGITAL SCREENING BILATERAL  . DG Bone  Density   An after-visit summary was printed and given to the patient at [place].  Please see the patient instructions which may contain other information and recommendations beyond what is mentioned above in the assessment and plan.

## 2015-11-07 ENCOUNTER — Encounter: Payer: Self-pay | Admitting: Endocrinology

## 2015-11-07 ENCOUNTER — Ambulatory Visit (INDEPENDENT_AMBULATORY_CARE_PROVIDER_SITE_OTHER): Payer: PPO | Admitting: Endocrinology

## 2015-11-07 VITALS — BP 128/86 | HR 70 | Temp 97.9°F | Ht 66.0 in | Wt 175.0 lb

## 2015-11-07 DIAGNOSIS — E039 Hypothyroidism, unspecified: Secondary | ICD-10-CM | POA: Diagnosis not present

## 2015-11-07 LAB — TSH: TSH: 2.13 u[IU]/mL (ref 0.35–4.50)

## 2015-11-07 NOTE — Progress Notes (Signed)
Subjective:    Patient ID: Rachel Cox, female    DOB: Oct 08, 1950, 65 y.o.   MRN: [id number]  HPI Pt returns for f/u of chronic primary hypothyroidism (dx'ed 2008; she has been on prescribed thyroid hormone therapy since then; she was on synthroid 150 mcg/day at one point, but she now requires less; US shows just 1 small nodule).   pt states she feels well in general.   Past Medical History  Diagnosis Date  . Allergy   . Hyperglycemia   . Hyperlipidemia   . Hypertension   . Hypothyroidism   . Insomnia   . Depression   . Anxiety   . Epilepsy ([place])     managed by Dr. Melrose Nakayama  . Anemia   . Osteopenia   . History of abnormal cervical Pap smear 1980's    Past Surgical History  Procedure Laterality Date  . Tubal ligation  1969  . Colonoscopy  Sept 2015  . Cryo procedure  1980s    for abnormal pap    Social History   Social History  . Marital Status: Married    Spouse Name: N/A  . Number of Children: N/A  . Years of Education: N/A   Occupational History  . Not on file.   Social History Main Topics  . Smoking status: Never Smoker   . Smokeless tobacco: Never Used  . Alcohol Use: No  . Drug Use: No  . Sexual Activity: Yes    Birth Control/ Protection: Post-menopausal   Other Topics Concern  . Not on file   Social History Narrative    Current Outpatient Prescriptions on File Prior to Visit  Medication Sig Dispense Refill  . aspirin EC 81 MG tablet Take 81 mg by mouth daily.    . Cyanocobalamin (VITAMIN B-12 PO) Take 400 mcg by mouth daily.     . fluticasone (FLONASE) 50 MCG/ACT nasal spray Place 2 sprays into both nostrils daily.    . folic acid (FOLVITE) A999333 MCG tablet Take 400 mcg by mouth daily.    Marland Kitchen levETIRAcetam (KEPPRA) 500 MG tablet Take 1 tablet (500 mg total) by mouth 2 (two) times daily. 60 tablet 0  . losartan-hydrochlorothiazide (HYZAAR) 100-25 MG tablet Take 1 tablet by mouth daily. 90 tablet 1  . simvastatin (ZOCOR) 40 MG tablet Take 1 tablet (40  mg total) by mouth at bedtime. 90 tablet 1   No current facility-administered medications on file prior to visit.    Allergies  Allergen Reactions  . Aspirin Nausea Only  . Codeine Palpitations    Pt vocalized    Family History  Problem Relation Age of Onset  . Hyperlipidemia Mother   . Hypertension Mother   . CAD Mother   . Thyroid disease Mother   . Heart disease Mother   . COPD Father   . Thyroid disease Father   . Cancer Brother     liver  . Alcohol abuse Brother   . Hypertension Brother   . Liver disease Brother   . Thyroid disease Sister   . Diabetes Neg Hx   . Stroke Neg Hx     BP 128/86 mmHg  Pulse 70  Temp(Src) 97.9 F (36.6 C) (Oral)  Ht 5\' 6"  (1.676 m)  Wt 175 lb (79.379 kg)  BMI 28.26 kg/m2  SpO2 97%  Review of Systems No weight change.    Objective:   Physical Exam VITAL SIGNS:  See vs page.  GENERAL: no distress.  NECK: I do  not appreciate the nodule.    Lab Results  Component Value Date   TSH 2.13 11/07/2015   T3TOTAL 123 12/19/2014   T4TOTAL 14.2* 12/19/2014      Assessment & Plan:  Hypothyroidism: well-replaced. Please continue the same medication.  Patient is advised the following: Patient Instructions  You seem to need less thyroid medication than you used to.  This may be due to your having 2 different types of thyroid problems--chronic inflammation which causes a low thyroid, and lumps, which can cause a high thyroid.  We'll have to follow your blood tests to tell.   A thyroid blood test is requested for you today.  We'll let you know about the results.  Please come back for a follow-up appointment in 6 months.    Renato Shin, MD

## 2015-11-07 NOTE — Patient Instructions (Signed)
You seem to need less thyroid medication than you used to.  This may be due to your having 2 different types of thyroid problems--chronic inflammation which causes a low thyroid, and lumps, which can cause a high thyroid.  We'll have to follow your blood tests to tell.   A thyroid blood test is requested for you today.  We'll let you know about the results.  Please come back for a follow-up appointment in 6 months.

## 2015-11-08 ENCOUNTER — Other Ambulatory Visit: Payer: Self-pay

## 2015-11-08 MED ORDER — LEVOTHYROXINE SODIUM 75 MCG PO TABS: 75.0000 ug | ORAL_TABLET | Freq: Every day | ORAL | Status: DC

## 2015-11-17 DIAGNOSIS — R569 Unspecified convulsions: Secondary | ICD-10-CM | POA: Diagnosis not present

## 2015-11-18 DIAGNOSIS — R569 Unspecified convulsions: Secondary | ICD-10-CM | POA: Diagnosis not present

## 2015-11-19 DIAGNOSIS — R569 Unspecified convulsions: Secondary | ICD-10-CM | POA: Diagnosis not present

## 2015-12-26 ENCOUNTER — Telehealth: Payer: Self-pay | Admitting: Family Medicine

## 2015-12-26 MED ORDER — SIMVASTATIN 40 MG PO TABS: 40.0000 mg | ORAL_TABLET | Freq: Every day | ORAL | 0 refills | Status: DC

## 2015-12-26 NOTE — Telephone Encounter (Signed)
Pt n

## 2015-12-26 NOTE — Telephone Encounter (Signed)
Reviewed last sgpt and lipids; rx approved 

## 2015-12-26 NOTE — Addendum Note (Signed)
Addended by: Laurabelle Gorczyca, Satira Anis on: 12/26/2015 01:31 PM   Modules accepted: Orders

## 2016-01-04 ENCOUNTER — Ambulatory Visit (INDEPENDENT_AMBULATORY_CARE_PROVIDER_SITE_OTHER): Payer: PPO | Admitting: Family Medicine

## 2016-01-04 ENCOUNTER — Encounter: Payer: Self-pay | Admitting: Family Medicine

## 2016-01-04 DIAGNOSIS — M8580 Other specified disorders of bone density and structure, unspecified site: Secondary | ICD-10-CM | POA: Diagnosis not present

## 2016-01-04 DIAGNOSIS — E785 Hyperlipidemia, unspecified: Secondary | ICD-10-CM

## 2016-01-04 DIAGNOSIS — I10 Essential (primary) hypertension: Secondary | ICD-10-CM | POA: Diagnosis not present

## 2016-01-04 DIAGNOSIS — E559 Vitamin D deficiency, unspecified: Secondary | ICD-10-CM

## 2016-01-04 DIAGNOSIS — J302 Other seasonal allergic rhinitis: Secondary | ICD-10-CM

## 2016-01-04 DIAGNOSIS — E039 Hypothyroidism, unspecified: Secondary | ICD-10-CM

## 2016-01-04 DIAGNOSIS — R739 Hyperglycemia, unspecified: Secondary | ICD-10-CM | POA: Diagnosis not present

## 2016-01-04 DIAGNOSIS — D6489 Other specified anemias: Secondary | ICD-10-CM | POA: Diagnosis not present

## 2016-01-04 DIAGNOSIS — Z5181 Encounter for therapeutic drug level monitoring: Secondary | ICD-10-CM

## 2016-01-04 DIAGNOSIS — G40802 Other epilepsy, not intractable, without status epilepticus: Secondary | ICD-10-CM

## 2016-01-04 LAB — CBC WITH DIFFERENTIAL/PLATELET
BASOS PCT: 1 %
Basophils Absolute: 51 cells/uL (ref 0–200)
EOS ABS: 255 {cells}/uL (ref 15–500)
EOS PCT: 5 %
HCT: 41.8 % (ref 35.0–45.0)
Hemoglobin: 13.5 g/dL (ref 11.7–15.5)
Lymphocytes Relative: 37 %
Lymphs Abs: 1887 cells/uL (ref 850–3900)
MCH: 29.7 pg (ref 27.0–33.0)
MCHC: 32.3 g/dL (ref 32.0–36.0)
MCV: 91.9 fL (ref 80.0–100.0)
MONO ABS: 612 {cells}/uL (ref 200–950)
MONOS PCT: 12 %
MPV: 9.9 fL (ref 7.5–12.5)
NEUTROS ABS: 2295 {cells}/uL (ref 1500–7800)
Neutrophils Relative %: 45 %
Platelets: 299 10*3/uL (ref 140–400)
RBC: 4.55 MIL/uL (ref 3.80–5.10)
RDW: 14 % (ref 11.0–15.0)
WBC: 5.1 10*3/uL (ref 3.8–10.8)

## 2016-01-04 LAB — LIPID PANEL
CHOL/HDL RATIO: 2 ratio (ref <=5.0)
CHOLESTEROL: 189 mg/dL (ref 125–200)
HDL: 94 mg/dL (ref >=46)
LDL Cholesterol: 78 mg/dL (ref <130)
Triglycerides: 85 mg/dL (ref <150)
VLDL: 17 mg/dL (ref <30)

## 2016-01-04 LAB — COMPLETE METABOLIC PANEL WITH GFR
ALT: 14 U/L (ref 6–29)
AST: 20 U/L (ref 10–35)
Albumin: 4 g/dL (ref 3.6–5.1)
Alkaline Phosphatase: 73 U/L (ref 33–130)
BILIRUBIN TOTAL: 0.6 mg/dL (ref 0.2–1.2)
BUN: 14 mg/dL (ref 7–25)
CHLORIDE: 103 mmol/L (ref 98–110)
CO2: 29 mmol/L (ref 20–31)
Calcium: 9.8 mg/dL (ref 8.6–10.4)
Creat: 0.9 mg/dL (ref 0.50–0.99)
GFR, EST NON AFRICAN AMERICAN: 66 mL/min (ref >=60)
GFR, Est African American: 77 mL/min (ref >=60)
GLUCOSE: 95 mg/dL (ref 65–99)
Potassium: 4.2 mmol/L (ref 3.5–5.3)
SODIUM: 141 mmol/L (ref 135–146)
TOTAL PROTEIN: 6.6 g/dL (ref 6.1–8.1)

## 2016-01-04 MED ORDER — LOSARTAN POTASSIUM-HCTZ 100-25 MG PO TABS: 1.0000 | ORAL_TABLET | Freq: Every day | ORAL | 1 refills | Status: DC

## 2016-01-04 MED ORDER — SIMVASTATIN 40 MG PO TABS: 40.0000 mg | ORAL_TABLET | Freq: Every day | ORAL | 1 refills | Status: DC

## 2016-01-04 MED ORDER — FLUTICASONE PROPIONATE 50 MCG/ACT NA SUSP: 2.0000 | Freq: Every day | NASAL | 3 refills | Status: DC

## 2016-01-04 NOTE — Assessment & Plan Note (Signed)
Continue flonase 

## 2016-01-04 NOTE — Patient Instructions (Addendum)
Try to limit saturated fats in your diet (bologna, hot dogs, barbeque, cheeseburgers, hamburgers, steak, bacon, sausage, cheese, etc.) and get more fresh fruits, vegetables, and whole grains  Your goal blood pressure is less than 150 mmHg on top. Try to follow the DASH guidelines (DASH stands for Dietary Approaches to Stop Hypertension) Try to limit the sodium in your diet.  Ideally, consume less than 1.5 grams (less than 1,500mg ) per day. Do not add salt when cooking or at the table.  Check the sodium amount on labels when shopping, and choose items lower in sodium when given a choice. Avoid or limit foods that already contain a lot of sodium. Eat a diet rich in fruits and vegetables and whole grains.  Try to use PLAIN allergy medicine without the decongestant Avoid: phenylephrine, phenylpropanolamine, and pseudoephredine  Please do call to schedule your bone density study; the number to schedule one at either [facility] or [facility] is [phone]   [place] stands for "Dietary Approaches to Stop Hypertension." The DASH eating plan is a healthy eating plan that has been shown to reduce high blood pressure (hypertension). Additional health benefits may include reducing the risk of type 2 diabetes mellitus, heart disease, and stroke. The DASH eating plan may also help with weight loss. WHAT DO I NEED TO KNOW ABOUT THE DASH EATING PLAN? For the DASH eating plan, you will follow these general guidelines:  Choose foods with a percent daily value for sodium of less than 5% (as listed on the food label).  Use salt-free seasonings or herbs instead of table salt or sea salt.  Check with your health care provider or pharmacist before using salt substitutes.  Eat lower-sodium products, often labeled as "lower sodium" or "no salt added."  Eat fresh foods.  Eat more vegetables, fruits, and low-fat dairy products.  Choose whole grains. Look for the word  "whole" as the first word in the ingredient list.  Choose fish and skinless chicken or [place] more often than red meat. Limit fish, poultry, and meat to 6 oz (170 g) each day.  Limit sweets, desserts, sugars, and sugary drinks.  Choose heart-healthy fats.  Limit cheese to 1 oz (28 g) per day.  Eat more home-cooked food and less restaurant, buffet, and fast food.  Limit fried foods.  Cook foods using methods other than frying.  Limit canned vegetables. If you do use them, rinse them well to decrease the sodium.  When eating at a restaurant, ask that your food be prepared with less salt, or no salt if possible. WHAT FOODS CAN I EAT? Seek help from a dietitian for individual calorie needs. Grains Whole grain or whole wheat bread. Brown rice. Whole grain or whole wheat pasta. Quinoa, bulgur, and whole grain cereals. Low-sodium cereals. Corn or whole wheat flour tortillas. Whole grain cornbread. Whole grain crackers. Low-sodium crackers. Vegetables Fresh or frozen vegetables (raw, steamed, roasted, or grilled). Low-sodium or reduced-sodium tomato and vegetable juices. Low-sodium or reduced-sodium tomato sauce and paste. Low-sodium or reduced-sodium canned vegetables.  Fruits All fresh, canned (in natural juice), or frozen fruits. Meat and Other Protein Products Ground beef (85% or leaner), grass-fed beef, or beef trimmed of fat. Skinless chicken or [place]. Ground chicken or [place]. Pork trimmed of fat. All fish and seafood. Eggs. Dried beans, peas, or lentils. Unsalted nuts and seeds. Unsalted canned beans. Dairy Low-fat dairy products, such as skim or 1% milk, 2% or reduced-fat cheeses, low-fat ricotta or cottage  cheese, or plain low-fat yogurt. Low-sodium or reduced-sodium cheeses. Fats and Oils Tub margarines without trans fats. Light or reduced-fat mayonnaise and salad dressings (reduced sodium). Avocado. Safflower, olive, or canola oils. Natural peanut or almond  butter. Other Unsalted popcorn and pretzels. The items listed above may not be a complete list of recommended foods or beverages. Contact your dietitian for more options. WHAT FOODS ARE NOT RECOMMENDED? Grains White bread. White pasta. White rice. Refined cornbread. Bagels and croissants. Crackers that contain trans fat. Vegetables Creamed or fried vegetables. Vegetables in a cheese sauce. Regular canned vegetables. Regular canned tomato sauce and paste. Regular tomato and vegetable juices. Fruits Dried fruits. Canned fruit in light or heavy syrup. Fruit juice. Meat and Other Protein Products Fatty cuts of meat. Ribs, chicken wings, bacon, sausage, bologna, salami, chitterlings, fatback, hot dogs, bratwurst, and packaged luncheon meats. Salted nuts and seeds. Canned beans with salt. Dairy Whole or 2% milk, cream, half-and-half, and cream cheese. Whole-fat or sweetened yogurt. Full-fat cheeses or blue cheese. Nondairy creamers and whipped toppings. Processed cheese, cheese spreads, or cheese curds. Condiments Onion and garlic salt, seasoned salt, table salt, and sea salt. Canned and packaged gravies. Worcestershire sauce. Tartar sauce. Barbecue sauce. Teriyaki sauce. Soy sauce, including reduced sodium. Steak sauce. Fish sauce. Oyster sauce. Cocktail sauce. Horseradish. Ketchup and mustard. Meat flavorings and tenderizers. Bouillon cubes. Hot sauce. Tabasco sauce. Marinades. Taco seasonings. Relishes. Fats and Oils Butter, stick margarine, lard, shortening, ghee, and bacon fat. Coconut, palm kernel, or palm oils. Regular salad dressings. Other Pickles and olives. Salted popcorn and pretzels. The items listed above may not be a complete list of foods and beverages to avoid. Contact your dietitian for more information. WHERE CAN I FIND MORE INFORMATION? National Heart, Lung, and Blood Institute: travelstabloid.com   This information is not intended to replace  advice given to you by your health care provider. Make sure you discuss any questions you have with your health care provider.   Document Released: 05/09/2011 Document Revised: 06/10/2014 Document Reviewed: 03/24/2013 Elsevier Interactive Patient Education [employer].

## 2016-01-04 NOTE — Assessment & Plan Note (Signed)
Continue to watch fats in the diet; continue statin; check lipids

## 2016-01-04 NOTE — Assessment & Plan Note (Signed)
Last TSH normal; managed by Dr. Loanne Drilling

## 2016-01-04 NOTE — Assessment & Plan Note (Signed)
Continue supplement; not checking today to save $

## 2016-01-04 NOTE — Assessment & Plan Note (Signed)
Reminded pt to order her DEXA

## 2016-01-04 NOTE — Assessment & Plan Note (Signed)
Check A1c; limit "whites"

## 2016-01-04 NOTE — Assessment & Plan Note (Signed)
Well-controlled; try the DASH guidelines, continue ARB-thiazide

## 2016-01-04 NOTE — Assessment & Plan Note (Signed)
Managed by Dr. Melrose Nakayama

## 2016-01-04 NOTE — Assessment & Plan Note (Signed)
Much improved, but will recheck to make sure no ongoing blood loss

## 2016-01-04 NOTE — Progress Notes (Signed)
BP 130/78   Pulse 97   Temp 98.1 F (36.7 C) (Oral)   Resp 14   Wt 177 lb 12.8 oz (80.6 kg)   SpO2 96%   BMI 28.70 kg/m    Subjective:    Patient ID: Rachel Cox, female    DOB: [birth year], 65 y.o.   MRN: [id number]  HPI: Rachel Cox is a 65 y.o. female  Chief Complaint  Patient presents with  . Follow-up    Hypothyroidism; Dr. Loanne Drilling just checked her thyroid on June 6th and TSH was 2.13; energy level is fine  Hx of anemia back in November, Hgb 7.2, then 9.3, then 10.7; recheck in January was 14.3 normal; no blood in the stool; no fatigue or SHOB; no chest pain  Vitamin D was a little low at 29.2, taking vitamin D 1000 iu daily  Prediabetes; A1c was 5.8 a year ago, but dropped to 5.4 in January; likes white bread but do not eat rice; drinks diet Mt Dew  High cholesterol; on statin, no muscle aches; she might eat three eggs a week; not much cheese, just some and not every day; does eat some fatty meats  Allergic rhinitis; using flonase and needs refill  Depression screen [facility] 2/9 01/04/2016 11/03/2015 03/02/2015  Decreased Interest 0 0 0  Down, Depressed, Hopeless 0 0 0  PHQ - 2 Score 0 0 0    No flowsheet data found.  Relevant past medical, surgical, family and social history reviewed Past Medical History:  Diagnosis Date  . Allergic rhinitis   . Allergy   . Anemia   . Anxiety   . Depression   . Epilepsy ([place])    managed by Dr. Melrose Nakayama  . History of abnormal cervical Pap smear 1980's  . Hyperglycemia   . Hyperlipidemia   . Hypertension   . Hypothyroidism   . Insomnia   . Osteopenia    Past Surgical History:  Procedure Laterality Date  . COLONOSCOPY  Sept 2015  . Cryo Procedure  1980s   for abnormal pap  . TUBAL LIGATION  1969   Family History  Problem Relation Age of Onset  . Hyperlipidemia Mother   . Hypertension Mother   . CAD Mother   . Thyroid disease Mother   . Heart disease Mother   . COPD Father   . Thyroid disease Father   . Cancer  Brother     liver  . Alcohol abuse Brother   . Hypertension Brother   . Liver disease Brother   . Thyroid disease Sister   . Diabetes Neg Hx   . Stroke Neg Hx    Social History  Substance Use Topics  . Smoking status: Never Smoker  . Smokeless tobacco: Never Used  . Alcohol use No   Interim medical history since last visit reviewed. Allergies and medications reviewed  Review of Systems Per HPI unless specifically indicated above     Objective:    BP 130/78   Pulse 97   Temp 98.1 F (36.7 C) (Oral)   Resp 14   Wt 177 lb 12.8 oz (80.6 kg)   SpO2 96%   BMI 28.70 kg/m   Wt Readings from Last 3 Encounters:  01/04/16 177 lb 12.8 oz (80.6 kg)  11/07/15 175 lb (79.4 kg)  11/03/15 173 lb (78.5 kg)    Physical Exam  Constitutional: She appears well-developed and well-nourished. No distress.  HENT:  Head: Normocephalic and atraumatic.  Eyes: EOM are normal. No  scleral icterus.  Neck: No thyromegaly present.  Cardiovascular: Normal rate, regular rhythm and normal heart sounds.   No murmur heard. Pulmonary/Chest: Effort normal and breath sounds normal. No respiratory distress. She has no wheezes.  Abdominal: Soft. Bowel sounds are normal. She exhibits no distension.  Musculoskeletal: Normal range of motion. She exhibits no edema.  Neurological: She is alert. She exhibits normal muscle tone.  Skin: Skin is warm and dry. She is not diaphoretic. No pallor.  Psychiatric: She has a normal mood and affect. Her behavior is normal. Judgment and thought content normal.    Results for orders placed or performed in visit on 11/07/15  TSH  Result Value Ref Range   TSH 2.13 0.35 - 4.50 uIU/mL      Assessment & Plan:   Problem List Items Addressed This Visit      Cardiovascular and Mediastinum   Hypertension    Well-controlled; try the DASH guidelines, continue ARB-thiazide      Relevant Medications   losartan-hydrochlorothiazide (HYZAAR) 100-25 MG tablet   simvastatin  (ZOCOR) 40 MG tablet     Respiratory   Allergic rhinitis    Continue flonase        Endocrine   Hypothyroidism    Last TSH normal; managed by Dr. Donnel Saxon and Auditory   Epilepsy [facility])    Managed by Dr. Melrose Nakayama        Musculoskeletal and Integument   Osteopenia    Reminded pt to order her DEXA        Other   Vitamin D deficiency    Continue supplement; not checking today to save $      Medication monitoring encounter    Monitor Creatinine and K+      Relevant Orders   COMPLETE METABOLIC PANEL WITH GFR   Hyperlipidemia    Continue to watch fats in the diet; continue statin; check lipids      Relevant Medications   losartan-hydrochlorothiazide (HYZAAR) 100-25 MG tablet   simvastatin (ZOCOR) 40 MG tablet   Other Relevant Orders   Lipid panel   Hyperglycemia    Check A1c; limit "whites"      Relevant Orders   Hemoglobin A1c   Anemia    Much improved, but will recheck to make sure no ongoing blood loss      Relevant Orders   CBC with Differential/Platelet    Other Visit Diagnoses   None.      Follow up plan: Return in about 6 months (around 07/06/2016) for fasting labs and visit.  An after-visit summary was printed and given to the patient at [place].  Please see the patient instructions which may contain other information and recommendations beyond what is mentioned above in the assessment and plan.  Meds ordered this encounter  Medications  . fluticasone (FLONASE) 50 MCG/ACT nasal spray    Sig: Place 2 sprays into both nostrils daily.    Dispense:  48 g    Refill:  3  . losartan-hydrochlorothiazide (HYZAAR) 100-25 MG tablet    Sig: Take 1 tablet by mouth daily.    Dispense:  90 tablet    Refill:  1  . simvastatin (ZOCOR) 40 MG tablet    Sig: Take 1 tablet (40 mg total) by mouth at bedtime.    Dispense:  90 tablet    Refill:  1    Orders Placed This Encounter  Procedures  . Hemoglobin A1c  . Lipid panel  .  CBC with  Differential/Platelet  . COMPLETE METABOLIC PANEL WITH GFR

## 2016-01-04 NOTE — Assessment & Plan Note (Signed)
Monitor Creatinine and K+

## 2016-01-05 LAB — HEMOGLOBIN A1C
Hgb A1c MFr Bld: 5.5 % (ref <5.7)
MEAN PLASMA GLUCOSE: 111 mg/dL

## 2016-01-09 DIAGNOSIS — Z1231 Encounter for screening mammogram for malignant neoplasm of breast: Secondary | ICD-10-CM | POA: Diagnosis not present

## 2016-01-30 DIAGNOSIS — M8589 Other specified disorders of bone density and structure, multiple sites: Secondary | ICD-10-CM | POA: Diagnosis not present

## 2016-02-05 ENCOUNTER — Telehealth: Payer: Self-pay | Admitting: Family Medicine

## 2016-02-05 DIAGNOSIS — M8580 Other specified disorders of bone density and structure, unspecified site: Secondary | ICD-10-CM

## 2016-02-05 NOTE — Telephone Encounter (Signed)
Patient has osteopenia on DEXA scan

## 2016-02-21 NOTE — Telephone Encounter (Signed)
I talked with patient She is taking calcium, vit D 1000 iu (do not do high dose) Stays active Fall precautions Next DEXA in two years

## 2016-05-08 ENCOUNTER — Encounter: Payer: Self-pay | Admitting: Endocrinology

## 2016-05-08 ENCOUNTER — Ambulatory Visit (INDEPENDENT_AMBULATORY_CARE_PROVIDER_SITE_OTHER): Payer: PPO | Admitting: Endocrinology

## 2016-05-08 VITALS — BP 138/82 | HR 90 | Ht 66.0 in | Wt 181.0 lb

## 2016-05-08 DIAGNOSIS — E063 Autoimmune thyroiditis: Secondary | ICD-10-CM

## 2016-05-08 DIAGNOSIS — E038 Other specified hypothyroidism: Secondary | ICD-10-CM | POA: Diagnosis not present

## 2016-05-08 LAB — TSH: TSH: 1.81 u[IU]/mL (ref 0.35–4.50)

## 2016-05-08 NOTE — Patient Instructions (Signed)
You seem to need less thyroid medication than you used to.  This may be due to your having 2 different types of thyroid problems--chronic inflammation which causes a low thyroid, and lumps, which can cause a high thyroid.  We'll have to follow your blood tests to tell.   A thyroid blood test is requested for you today.  We'll let you know about the results.  Please come back for a follow-up appointment in 6 months.

## 2016-05-08 NOTE — Progress Notes (Signed)
Subjective:    Patient ID: Rachel Cox, female    DOB: 02-22-1951, 65 y.o.   MRN: [id number]  HPI Pt returns for f/u of chronic primary hypothyroidism (dx'ed 2008; she has been on prescribed thyroid hormone therapy since then; she was on synthroid 150 mcg/day at one point, but she now requires less; US shows just 1 small nodule).   pt states she feels well in general, except for weight gain. Past Medical History:  Diagnosis Date  . Allergic rhinitis   . Allergy   . Anemia   . Anxiety   . Depression   . Epilepsy ([place])    managed by Dr. Melrose Nakayama  . History of abnormal cervical Pap smear 1980's  . Hyperglycemia   . Hyperlipidemia   . Hypertension   . Hypothyroidism   . Insomnia   . Osteopenia     Past Surgical History:  Procedure Laterality Date  . COLONOSCOPY  Sept 2015  . Cryo Procedure  1980s   for abnormal pap  . TUBAL LIGATION  1969    Social History   Social History  . Marital status: Married    Spouse name: N/A  . Number of children: N/A  . Years of education: N/A   Occupational History  . Not on file.   Social History Main Topics  . Smoking status: Never Smoker  . Smokeless tobacco: Never Used  . Alcohol use No  . Drug use: No  . Sexual activity: Yes    Birth control/ protection: Post-menopausal   Other Topics Concern  . Not on file   Social History Narrative  . No narrative on file    Current Outpatient Prescriptions on File Prior to Visit  Medication Sig Dispense Refill  . Cyanocobalamin (VITAMIN B-12 PO) Take 400 mcg by mouth daily.     . fluticasone (FLONASE) 50 MCG/ACT nasal spray Place 2 sprays into both nostrils daily. 48 g 3  . folic acid (FOLVITE) A999333 MCG tablet Take 400 mcg by mouth daily.    Marland Kitchen levETIRAcetam (KEPPRA) 500 MG tablet Take 1 tablet (500 mg total) by mouth 2 (two) times daily. 60 tablet 0  . losartan-hydrochlorothiazide (HYZAAR) 100-25 MG tablet Take 1 tablet by mouth daily. 90 tablet 1  . simvastatin (ZOCOR) 40 MG tablet  Take 1 tablet (40 mg total) by mouth at bedtime. 90 tablet 1   No current facility-administered medications on file prior to visit.     Allergies  Allergen Reactions  . Aspirin Nausea Only  . Codeine Palpitations    Pt vocalized    Family History  Problem Relation Age of Onset  . Hyperlipidemia Mother   . Hypertension Mother   . CAD Mother   . Thyroid disease Mother   . Heart disease Mother   . COPD Father   . Thyroid disease Father   . Cancer Brother     liver  . Alcohol abuse Brother   . Hypertension Brother   . Liver disease Brother   . Thyroid disease Sister   . Diabetes Neg Hx   . Stroke Neg Hx     BP 138/82   Pulse 90   Ht 5\' 6"  (1.676 m)   Wt 181 lb (82.1 kg)   SpO2 97%   BMI 29.21 kg/m    Review of Systems Denies neck fullness    Objective:   Physical Exam VITAL SIGNS:  See vs page GENERAL: no distress NECKk: thyroid nodule is non-palpable   Lab Results  Component Value Date   TSH 1.81 05/08/2016   T3TOTAL 123 12/19/2014   T4TOTAL 14.2 (H) 12/19/2014      Assessment & Plan:  Hypothyroidism: well-replaced.  Please continue the same medication

## 2016-05-09 ENCOUNTER — Other Ambulatory Visit: Payer: Self-pay

## 2016-05-09 MED ORDER — LEVOTHYROXINE SODIUM 75 MCG PO TABS
75.0000 ug | ORAL_TABLET | Freq: Every day | ORAL | 5 refills | Status: DC
Start: 2016-05-09 — End: 2016-11-07

## 2016-05-29 ENCOUNTER — Telehealth: Payer: Self-pay | Admitting: Endocrinology

## 2016-05-29 NOTE — Telephone Encounter (Signed)
Pharmacy called to get permission to change the generic brand of Synthroid due to the shortage.

## 2016-05-29 NOTE — Telephone Encounter (Signed)
[facility] notified ok to change the manufacturing.

## 2016-07-09 ENCOUNTER — Ambulatory Visit (INDEPENDENT_AMBULATORY_CARE_PROVIDER_SITE_OTHER): Payer: PPO | Admitting: Family Medicine

## 2016-07-09 ENCOUNTER — Encounter: Payer: Self-pay | Admitting: Family Medicine

## 2016-07-09 DIAGNOSIS — R569 Unspecified convulsions: Secondary | ICD-10-CM

## 2016-07-09 DIAGNOSIS — R739 Hyperglycemia, unspecified: Secondary | ICD-10-CM | POA: Diagnosis not present

## 2016-07-09 DIAGNOSIS — M8580 Other specified disorders of bone density and structure, unspecified site: Secondary | ICD-10-CM | POA: Diagnosis not present

## 2016-07-09 DIAGNOSIS — E063 Autoimmune thyroiditis: Secondary | ICD-10-CM

## 2016-07-09 DIAGNOSIS — E038 Other specified hypothyroidism: Secondary | ICD-10-CM | POA: Diagnosis not present

## 2016-07-09 DIAGNOSIS — I10 Essential (primary) hypertension: Secondary | ICD-10-CM

## 2016-07-09 DIAGNOSIS — J3089 Other allergic rhinitis: Secondary | ICD-10-CM | POA: Diagnosis not present

## 2016-07-09 DIAGNOSIS — Z5181 Encounter for therapeutic drug level monitoring: Secondary | ICD-10-CM | POA: Diagnosis not present

## 2016-07-09 DIAGNOSIS — E041 Nontoxic single thyroid nodule: Secondary | ICD-10-CM

## 2016-07-09 DIAGNOSIS — E782 Mixed hyperlipidemia: Secondary | ICD-10-CM | POA: Diagnosis not present

## 2016-07-09 LAB — COMPLETE METABOLIC PANEL WITH GFR
ALK PHOS: 62 U/L (ref 33–130)
ALT: 13 U/L (ref 6–29)
AST: 19 U/L (ref 10–35)
Albumin: 4.1 g/dL (ref 3.6–5.1)
BILIRUBIN TOTAL: 0.7 mg/dL (ref 0.2–1.2)
BUN: 16 mg/dL (ref 7–25)
CALCIUM: 9.8 mg/dL (ref 8.6–10.4)
CO2: 28 mmol/L (ref 20–31)
Chloride: 103 mmol/L (ref 98–110)
Creat: 1 mg/dL — ABNORMAL HIGH (ref 0.50–0.99)
GFR, EST AFRICAN AMERICAN: 67 mL/min (ref >=60)
GFR, EST NON AFRICAN AMERICAN: 58 mL/min — AB (ref >=60)
Glucose, Bld: 95 mg/dL (ref 65–99)
Potassium: 4 mmol/L (ref 3.5–5.3)
Sodium: 141 mmol/L (ref 135–146)
TOTAL PROTEIN: 6.8 g/dL (ref 6.1–8.1)

## 2016-07-09 LAB — LIPID PANEL
CHOLESTEROL: 183 mg/dL (ref <200)
HDL: 92 mg/dL (ref >50)
LDL CALC: 75 mg/dL (ref <100)
TRIGLYCERIDES: 81 mg/dL (ref <150)
Total CHOL/HDL Ratio: 2 Ratio (ref <5.0)
VLDL: 16 mg/dL (ref <30)

## 2016-07-09 MED ORDER — LOSARTAN POTASSIUM-HCTZ 100-25 MG PO TABS: 1.0000 | ORAL_TABLET | Freq: Every day | ORAL | 3 refills | Status: DC

## 2016-07-09 MED ORDER — SIMVASTATIN 40 MG PO TABS: 40.0000 mg | ORAL_TABLET | Freq: Every day | ORAL | 3 refills | Status: DC

## 2016-07-09 NOTE — Progress Notes (Signed)
BP 124/80   Pulse 93   Temp 98.2 F (36.8 C) (Oral)   Resp 16   Wt 181 lb (82.1 kg)   SpO2 96%   BMI 29.21 kg/m    Subjective:    Patient ID: Rachel Cox, female    DOB: Mar 16, 1951, 66 y.o.   MRN: [id number]  HPI: Rachel Cox is a 66 y.o. female  Chief Complaint  Patient presents with  . Follow-up    6 months   Patient is here for f/u and doing really well HTN; controlled; patient checks BP and same numbers, 120s, nothing over 130; tries to limit salt; knows to avoid decongestants Allergic rhinitis; avoiding decongestants; little bit now, but all year; no further treatment desired Hypothyroidism; weight overall stable; seeing endocrinologist for this; moving bowels okay; he is doing labs now Flu shot UTD Osteopenia; last DEXA reviewed; walking with new dog; taking calcium supplement, has vitamin D too High glucose; no dry mouth High cholesterol; tries to eat a healthy; limit eggs  Depression screen [facility] 2/9 07/09/2016 01/04/2016 11/03/2015 03/02/2015  Decreased Interest 0 0 0 0  Down, Depressed, Hopeless 0 0 0 0  PHQ - 2 Score 0 0 0 0   Relevant past medical, surgical, family and social history reviewed Past Medical History:  Diagnosis Date  . Allergic rhinitis   . Allergy   . Anemia   . Anxiety   . Depression   . Epilepsy ([place])    managed by Dr. Melrose Nakayama  . History of abnormal cervical Pap smear 1980's  . Hyperglycemia   . Hyperlipidemia   . Hypertension   . Hypothyroidism   . Insomnia   . Osteopenia    Past Surgical History:  Procedure Laterality Date  . COLONOSCOPY  Sept 2015  . Cryo Procedure  1980s   for abnormal pap  . TUBAL LIGATION  1969   Family History  Problem Relation Age of Onset  . Hyperlipidemia Mother   . Hypertension Mother   . CAD Mother   . Thyroid disease Mother   . Heart disease Mother   . COPD Father   . Thyroid disease Father   . Cancer Brother     liver  . Alcohol abuse Brother   . Hypertension Brother   . Liver disease  Brother   . Thyroid disease Sister   . Diabetes Neg Hx   . Stroke Neg Hx    Social History  Substance Use Topics  . Smoking status: Never Smoker  . Smokeless tobacco: Never Used  . Alcohol use No   Interim medical history since last visit reviewed. Allergies and medications reviewed  Review of Systems Per HPI unless specifically indicated above     Objective:    BP 124/80   Pulse 93   Temp 98.2 F (36.8 C) (Oral)   Resp 16   Wt 181 lb (82.1 kg)   SpO2 96%   BMI 29.21 kg/m   Wt Readings from Last 3 Encounters:  07/09/16 181 lb (82.1 kg)  05/08/16 181 lb (82.1 kg)  01/04/16 177 lb 12.8 oz (80.6 kg)    Physical Exam  Constitutional: She appears well-developed and well-nourished. No distress.  Eyes: EOM are normal. No scleral icterus.  Neck: No thyromegaly present.  Cardiovascular: Normal rate, regular rhythm and normal heart sounds.   No murmur heard. Pulmonary/Chest: Effort normal and breath sounds normal.  Abdominal: Soft. Bowel sounds are normal. She exhibits no distension.  Musculoskeletal: Normal range of  motion. She exhibits no edema.  Neurological: She is alert.  Skin: Skin is warm and dry. No pallor.  Psychiatric: She has a normal mood and affect. Her behavior is normal. Judgment and thought content normal.      Assessment & Plan:   Problem List Items Addressed This Visit      Cardiovascular and Mediastinum   Hypertension    Well-controlled; avoid decongestants, limit salt; DASH guidelines encouraged      Relevant Medications   simvastatin (ZOCOR) 40 MG tablet   losartan-hydrochlorothiazide (HYZAAR) 100-25 MG tablet     Respiratory   Allergic rhinitis    Controlled, avoid decongestants        Endocrine   Thyroid nodule    Managed by endo now      Hypothyroidism    Managed by ENT        Musculoskeletal and Integument   Osteopenia    Last DEXA reviewed; three servings of calcium a day; fall precautions        Other   Seizure ([place])     Dr. Melrose Nakayama manages      Medication monitoring encounter    Check labs      Relevant Orders   COMPLETE METABOLIC PANEL WITH GFR (Completed)   Hyperlipidemia    Limit saturated fats; check labs today      Relevant Medications   simvastatin (ZOCOR) 40 MG tablet   losartan-hydrochlorothiazide (HYZAAR) 100-25 MG tablet   Other Relevant Orders   Lipid panel (Completed)   Hyperglycemia    Last A1c normal; check fasting glucose today         Follow up plan: Return in about 6 months (around 01/06/2017) for visit.  An after-visit summary was printed and given to the patient at [place].  Please see the patient instructions which may contain other information and recommendations beyond what is mentioned above in the assessment and plan.  Meds ordered this encounter  Medications  . simvastatin (ZOCOR) 40 MG tablet    Sig: Take 1 tablet (40 mg total) by mouth at bedtime.    Dispense:  90 tablet    Refill:  3  . losartan-hydrochlorothiazide (HYZAAR) 100-25 MG tablet    Sig: Take 1 tablet by mouth daily.    Dispense:  90 tablet    Refill:  3    Orders Placed This Encounter  Procedures  . Lipid panel  . COMPLETE METABOLIC PANEL WITH GFR

## 2016-07-09 NOTE — Assessment & Plan Note (Signed)
Managed by ENT 

## 2016-07-09 NOTE — Assessment & Plan Note (Signed)
Dr. Melrose Nakayama manages

## 2016-07-09 NOTE — Patient Instructions (Signed)
Let's check labs If you have not heard anything from my staff in a week about any orders/referrals/studies from today, please contact us here to follow-up [phone] Try to limit saturated fats in your diet (bologna, hot dogs, barbeque, cheeseburgers, hamburgers, steak, bacon, sausage, cheese, etc.) and get more fresh fruits, vegetables, and whole grains

## 2016-07-09 NOTE — Assessment & Plan Note (Signed)
Well-controlled; avoid decongestants, limit salt; DASH guidelines encouraged

## 2016-07-09 NOTE — Assessment & Plan Note (Signed)
Managed by endo now

## 2016-07-09 NOTE — Assessment & Plan Note (Signed)
Limit saturated fats; check labs today

## 2016-07-09 NOTE — Assessment & Plan Note (Signed)
Controlled, avoid decongestants

## 2016-07-09 NOTE — Assessment & Plan Note (Signed)
Last DEXA reviewed; three servings of calcium a day; fall precautions

## 2016-07-09 NOTE — Assessment & Plan Note (Signed)
Last A1c normal; check fasting glucose today

## 2016-07-09 NOTE — Assessment & Plan Note (Signed)
Check labs 

## 2016-10-16 DIAGNOSIS — F439 Reaction to severe stress, unspecified: Secondary | ICD-10-CM | POA: Diagnosis not present

## 2016-10-16 DIAGNOSIS — I10 Essential (primary) hypertension: Secondary | ICD-10-CM | POA: Diagnosis not present

## 2016-10-16 DIAGNOSIS — R569 Unspecified convulsions: Secondary | ICD-10-CM | POA: Diagnosis not present

## 2016-10-18 DIAGNOSIS — F439 Reaction to severe stress, unspecified: Secondary | ICD-10-CM | POA: Insufficient documentation

## 2016-11-05 ENCOUNTER — Encounter: Payer: PPO | Admitting: Family Medicine

## 2016-11-06 ENCOUNTER — Ambulatory Visit (INDEPENDENT_AMBULATORY_CARE_PROVIDER_SITE_OTHER): Payer: PPO | Admitting: Endocrinology

## 2016-11-06 ENCOUNTER — Encounter: Payer: Self-pay | Admitting: Endocrinology

## 2016-11-06 VITALS — BP 124/78 | HR 84 | Ht 66.0 in | Wt 179.0 lb

## 2016-11-06 DIAGNOSIS — E063 Autoimmune thyroiditis: Secondary | ICD-10-CM | POA: Diagnosis not present

## 2016-11-06 DIAGNOSIS — E038 Other specified hypothyroidism: Secondary | ICD-10-CM

## 2016-11-06 LAB — TSH: TSH: 1.15 u[IU]/mL (ref 0.35–4.50)

## 2016-11-06 NOTE — Progress Notes (Signed)
Subjective:    Patient ID: Rachel Cox, female    DOB: 19-Sep-1950, 66 y.o.   MRN: [id number]  HPI Pt returns for f/u of chronic primary hypothyroidism (dx'ed 2008; she has been on prescribed thyroid hormone therapy since then; she was on synthroid 150 mcg/day at one point, but she now requires less; US shows just 1 small nodule).   pt states she feels well in general.   Past Medical History:  Diagnosis Date  . Allergic rhinitis   . Allergy   . Anemia   . Anxiety   . Depression   . Epilepsy ([place])    managed by Dr. Melrose Nakayama  . History of abnormal cervical Pap smear 1980's  . Hyperglycemia   . Hyperlipidemia   . Hypertension   . Hypothyroidism   . Insomnia   . Osteopenia     Past Surgical History:  Procedure Laterality Date  . COLONOSCOPY  Sept 2015  . Cryo Procedure  1980s   for abnormal pap  . TUBAL LIGATION  1969    Social History   Social History  . Marital status: Married    Spouse name: N/A  . Number of children: N/A  . Years of education: N/A   Occupational History  . Not on file.   Social History Main Topics  . Smoking status: Never Smoker  . Smokeless tobacco: Never Used  . Alcohol use No  . Drug use: No  . Sexual activity: Yes    Birth control/ protection: Post-menopausal   Other Topics Concern  . Not on file   Social History Narrative  . No narrative on file    Current Outpatient Prescriptions on File Prior to Visit  Medication Sig Dispense Refill  . Cyanocobalamin (VITAMIN B-12 PO) Take 400 mcg by mouth daily.     . fluticasone (FLONASE) 50 MCG/ACT nasal spray Place 2 sprays into both nostrils daily. 48 g 3  . folic acid (FOLVITE) 540 MCG tablet Take 400 mcg by mouth daily.    Marland Kitchen levETIRAcetam (KEPPRA) 500 MG tablet Take 1 tablet (500 mg total) by mouth 2 (two) times daily. 60 tablet 0  . losartan-hydrochlorothiazide (HYZAAR) 100-25 MG tablet Take 1 tablet by mouth daily. 90 tablet 3  . simvastatin (ZOCOR) 40 MG tablet Take 1 tablet (40 mg  total) by mouth at bedtime. 90 tablet 3   No current facility-administered medications on file prior to visit.     Allergies  Allergen Reactions  . Aspirin Nausea Only  . Codeine Palpitations    Pt vocalized    Family History  Problem Relation Age of Onset  . Hyperlipidemia Mother   . Hypertension Mother   . CAD Mother   . Thyroid disease Mother   . Heart disease Mother   . COPD Father   . Thyroid disease Father   . Cancer Brother        liver  . Alcohol abuse Brother   . Hypertension Brother   . Liver disease Brother   . Thyroid disease Sister   . Diabetes Neg Hx   . Stroke Neg Hx     BP 124/78   Pulse 84   Ht 5\' 6"  (1.676 m)   Wt 179 lb (81.2 kg)   SpO2 96%   BMI 28.89 kg/m   Review of Systems Denies leg edema.      Objective:   Physical Exam VITAL SIGNS:  See vs page GENERAL: no distress. NECK: There is no palpable thyroid enlargement.  No thyroid nodule is palpable.  No palpable lymphadenopathy at the anterior neck.     Assessment & Plan:  Chronic primary hypothyroidism,due for recheck. We discussed f/u US.  She declines.    Patient Instructions  You seem to need less thyroid medication than you used to.  This may be due to your having 2 different types of thyroid problems--chronic inflammation which causes a low thyroid, and lumps, which can cause a high thyroid.  We'll have to follow your blood tests to tell.   A thyroid blood test is requested for you today.  We'll let you know about the results.  Please come back for a follow-up appointment in 1 year.

## 2016-11-06 NOTE — Patient Instructions (Addendum)
You seem to need less thyroid medication than you used to.  This may be due to your having 2 different types of thyroid problems--chronic inflammation which causes a low thyroid, and lumps, which can cause a high thyroid.  We'll have to follow your blood tests to tell.   A thyroid blood test is requested for you today.  We'll let you know about the results.  Please come back for a follow-up appointment in 1 year.

## 2016-11-07 ENCOUNTER — Other Ambulatory Visit: Payer: Self-pay

## 2016-11-07 MED ORDER — LEVOTHYROXINE SODIUM 75 MCG PO TABS: 75.0000 ug | ORAL_TABLET | Freq: Every day | ORAL | 5 refills | Status: DC

## 2016-11-20 ENCOUNTER — Ambulatory Visit (INDEPENDENT_AMBULATORY_CARE_PROVIDER_SITE_OTHER): Payer: PPO | Admitting: Family Medicine

## 2016-11-20 ENCOUNTER — Encounter: Payer: Self-pay | Admitting: Family Medicine

## 2016-11-20 VITALS — BP 136/74 | HR 92 | Temp 98.4°F | Resp 16 | Ht 63.0 in | Wt 179.4 lb

## 2016-11-20 DIAGNOSIS — Z1231 Encounter for screening mammogram for malignant neoplasm of breast: Secondary | ICD-10-CM | POA: Diagnosis not present

## 2016-11-20 DIAGNOSIS — Z0000 Encounter for general adult medical examination without abnormal findings: Secondary | ICD-10-CM | POA: Diagnosis not present

## 2016-11-20 DIAGNOSIS — Z1239 Encounter for other screening for malignant neoplasm of breast: Secondary | ICD-10-CM

## 2016-11-20 NOTE — Assessment & Plan Note (Signed)
USPSTF grade A and B recommendations reviewed with patient; age-appropriate recommendations, preventive care, screening tests, etc discussed and encouraged; healthy living encouraged; see AVS for patient education given to patient  

## 2016-11-20 NOTE — Patient Instructions (Signed)
Health Maintenance  Topic Date Due  . TETANUS/TDAP  07/04/2008  . MAMMOGRAM  11/20/2016 (Originally 06/03/2016)  . INFLUENZA VACCINE  01/01/2017  . DEXA SCAN  01/01/2018  . COLONOSCOPY  02/23/2019  . Hepatitis C Screening  Addressed  . PNA vac Low Risk Adult  Completed   Consider the new shingles vaccine (Shingrix) Aim for 150 pounds by this time next year Check out the information at familydoctor.org entitled "Nutrition for Weight Loss: What You Need to Know about Fad Diets" Try to lose between 1-2 pounds per week by taking in fewer calories and burning off more calories You can succeed by limiting portions, limiting foods dense in calories and fat, becoming more active, and drinking 8 glasses of water a day (64 ounces) Don't skip meals, especially breakfast, as skipping meals may alter your metabolism Do not use over-the-counter weight loss pills or gimmicks that claim rapid weight loss A healthy BMI (or body mass index) is between 18.5 and 24.9 You can calculate your ideal BMI at the [place] website [employer]Mon[employer]fr

## 2016-11-20 NOTE — Progress Notes (Signed)
Patient: Rachel Cox, Female    DOB: Jan 01, 1951, 66 y.o.   MRN: [id number]  Visit Date: 11/20/2016  Today's Provider: Enid Derry, MD   Chief Complaint  Patient presents with  . Medicare Wellness    Subjective:   Rachel Cox is a 66 y.o. female who presents today for her Subsequent Annual Wellness Visit.  Caregiver input:  n/a  USPSTF grade A and B recommendations Depression:  Depression screen [facility] 2/9 11/20/2016 07/09/2016 01/04/2016 11/03/2015 03/02/2015  Decreased Interest 0 0 0 0 0  Down, Depressed, Hopeless 0 0 0 0 0  PHQ - 2 Score 0 0 0 0 0   Hypertension: BP Readings from Last 3 Encounters:  11/20/16 136/74  11/06/16 124/78  07/09/16 124/80   Obesity: Wt Readings from Last 3 Encounters:  11/20/16 179 lb 6.4 oz (81.4 kg)  11/06/16 179 lb (81.2 kg)  07/09/16 181 lb (82.1 kg)   BMI Readings from Last 3 Encounters:  11/20/16 31.78 kg/m  11/06/16 28.89 kg/m  07/09/16 29.21 kg/m    Alcohol: no Tobacco use: never smoker HIV, hep B, hep C: done STD testing and prevention (chl/gon/syphilis): not interested in testing Intimate partner violence: no abuse Breast cancer: patient declined, will get every two years Cervical cancer screening: no abnormal pap smear; graduated, no more pap smears needed Osteoporosis: DEXA UTD; gets plenty of calcium Fall prevention/vitamin D: taking vit D, DEXA in 2017 Lipids:  Lab Results  Component Value Date   CHOL 183 07/09/2016   CHOL 189 01/04/2016   CHOL 208 (H) 07/04/2015   Lab Results  Component Value Date   HDL 92 07/09/2016   HDL 94 01/04/2016   HDL 90 07/04/2015   Lab Results  Component Value Date   LDLCALC 75 07/09/2016   LDLCALC 78 01/04/2016   LDLCALC 98 07/04/2015   Lab Results  Component Value Date   TRIG 81 07/09/2016   TRIG 85 01/04/2016   TRIG 102 07/04/2015   Lab Results  Component Value Date   CHOLHDL 2.0 07/09/2016   CHOLHDL 2.0 01/04/2016   No results found for: LDLDIRECT Glucose:  Glucose   Date Value Ref Range Status  07/04/2015 96 65 - 99 mg/dL Final  04/20/2015 100 (H) 65 - 99 mg/dL Final   Glucose, Bld  Date Value Ref Range Status  07/09/2016 95 65 - 99 mg/dL Final  01/04/2016 95 65 - 99 mg/dL Final  04/10/2015 97 65 - 99 mg/dL Final   Colorectal cancer: 2015, next due 2020 Lung cancer:  Never smoker AAA: [place] abd done 2014: The aorta and IVC are unremarkable. Aspirin: not taking because of allergy Diet: pretty good eater most of the time; not much meat Exercise: tries to stay active Skin cancer: no worrisome moles  HPI  Review of Systems  Past Medical History:  Diagnosis Date  . Allergic rhinitis   . Allergy   . Anemia   . Anxiety   . Depression   . Epilepsy ([place])    managed by Dr. Melrose Nakayama  . History of abnormal cervical Pap smear 1980's  . Hyperglycemia   . Hyperlipidemia   . Hypertension   . Hypothyroidism   . Insomnia   . Osteopenia     Past Surgical History:  Procedure Laterality Date  . COLONOSCOPY  Sept 2015  . Cryo Procedure  1980s   for abnormal pap  . TUBAL LIGATION  1969    Family History  Problem Relation Age of Onset  . Hyperlipidemia  Mother   . Hypertension Mother   . CAD Mother   . Thyroid disease Mother   . Heart disease Mother   . COPD Father   . Thyroid disease Father   . Cancer Brother        liver  . Alcohol abuse Brother   . Hypertension Brother   . Liver disease Brother   . Thyroid disease Sister   . Diabetes Neg Hx   . Stroke Neg Hx     Social History   Social History  . Marital status: Married    Spouse name: N/A  . Number of children: N/A  . Years of education: N/A   Occupational History  . Not on file.   Social History Main Topics  . Smoking status: Never Smoker  . Smokeless tobacco: Never Used  . Alcohol use No  . Drug use: No  . Sexual activity: Yes    Birth control/ protection: Post-menopausal   Other Topics Concern  . Not on file   Social History Narrative  . No narrative on file    6CIT Screen 11/20/2016  What Year? 0 points  What month? 0 points  What time? 0 points  Count back from 20 0 points  Months in reverse 2 points  Repeat phrase 2 points  Total Score 4   Outpatient Encounter Prescriptions as of 11/20/2016  Medication Sig  . Cyanocobalamin (VITAMIN B-12 PO) Take 400 mcg by mouth daily.   . fluticasone (FLONASE) 50 MCG/ACT nasal spray Place 2 sprays into both nostrils daily.  . folic acid (FOLVITE) 017 MCG tablet Take 400 mcg by mouth daily.  Marland Kitchen levETIRAcetam (KEPPRA) 250 MG tablet Take 250 mg by mouth 2 (two) times daily.  Marland Kitchen levothyroxine (SYNTHROID, LEVOTHROID) 75 MCG tablet Take 1 tablet (75 mcg total) by mouth daily before breakfast.  . losartan-hydrochlorothiazide (HYZAAR) 100-25 MG tablet Take 1 tablet by mouth daily.  . mirtazapine (REMERON) 15 MG tablet 15 mg. Take 1 tablet daily  . simvastatin (ZOCOR) 40 MG tablet Take 1 tablet (40 mg total) by mouth at bedtime.  . [DISCONTINUED] levETIRAcetam (KEPPRA) 500 MG tablet Take 1 tablet (500 mg total) by mouth 2 (two) times daily.   No facility-administered encounter medications on file as of 11/20/2016.     Functional Ability / Safety Screening 1.  Was the timed Get Up and Go test longer than 30 seconds?  no 2.  Does the patient need help with the phone, transportation, shopping,      preparing meals, housework, laundry, medications, or managing money?  no 3.  Does the patient's home have:  loose throw rugs in the hallway?   no      Grab bars in the bathroom? yes      Handrails on the stairs?   no stairs      Poor lighting?   no 4.  Has the patient noticed any hearing difficulties?   no  Fall Risk Assessment See under rooming  Depression Screen See under rooming Depression screen [facility] 2/9 11/20/2016 07/09/2016 01/04/2016 11/03/2015 03/02/2015  Decreased Interest 0 0 0 0 0  Down, Depressed, Hopeless 0 0 0 0 0  PHQ - 2 Score 0 0 0 0 0    Advanced Directives Does patient have a HCPOA?    no If yes,  name and contact information: n/a Does patient have a living will or MOST form?  no  Objective:   Vitals: BP 136/74   Pulse 92   Temp  98.4 F (36.9 C) (Oral)   Resp 16   Ht 5\' 3"  (1.6 m)   Wt 179 lb 6.4 oz (81.4 kg)   SpO2 96%   BMI 31.78 kg/m  Body mass index is 31.78 kg/m. No exam data present  Physical Exam Mood/affect:  euthymic Appearance:  Casually dressed  No flowsheet data found.  Assessment & Plan:     Annual Wellness Visit  Reviewed patient's Family Medical History Reviewed and updated list of patient's medical providers Assessment of cognitive impairment was done Assessed patient's functional ability Established a written schedule for health screening [place] Completed and Reviewed  Exercise Activities and Dietary recommendations Goals    None    will try to work on a little weight loss; would like to weigh 150 pounds by next year  Immunization History  Administered Date(s) Administered  . Influenza,inj,Quad PF,36+ Mos 03/02/2015  . Influenza-Unspecified 04/16/2016  . Pneumococcal Conjugate-13 11/15/2013  . Pneumococcal Polysaccharide-23 07/04/2015  . Td 07/04/1998  . Zoster 06/03/2010  patient declined tetanus, will get one if hurt New shingles discussed; she declined for now  Health Maintenance  Topic Date Due  . TETANUS/TDAP  07/04/2008  . MAMMOGRAM  11/20/2016 (Originally 06/03/2016)  . INFLUENZA VACCINE  01/01/2017  . DEXA SCAN  01/01/2018  . COLONOSCOPY  02/23/2019  . Hepatitis C Screening  Addressed  . PNA vac Low Risk Adult  Completed   Discussed health benefits of physical activity, and encouraged her to engage in regular exercise appropriate for her age and condition.   Meds ordered this encounter  Medications  . levETIRAcetam (KEPPRA) 250 MG tablet    Sig: Take 250 mg by mouth 2 (two) times daily.    Current Outpatient Prescriptions:  .  Cyanocobalamin (VITAMIN B-12 PO), Take 400 mcg by mouth daily. ,  Disp: , Rfl:  .  fluticasone (FLONASE) 50 MCG/ACT nasal spray, Place 2 sprays into both nostrils daily., Disp: 48 g, Rfl: 3 .  folic acid (FOLVITE) 542 MCG tablet, Take 400 mcg by mouth daily., Disp: , Rfl:  .  levETIRAcetam (KEPPRA) 250 MG tablet, Take 250 mg by mouth 2 (two) times daily., Disp: , Rfl:  .  levothyroxine (SYNTHROID, LEVOTHROID) 75 MCG tablet, Take 1 tablet (75 mcg total) by mouth daily before breakfast., Disp: 30 tablet, Rfl: 5 .  losartan-hydrochlorothiazide (HYZAAR) 100-25 MG tablet, Take 1 tablet by mouth daily., Disp: 90 tablet, Rfl: 3 .  mirtazapine (REMERON) 15 MG tablet, 15 mg. Take 1 tablet daily, Disp: , Rfl:  .  simvastatin (ZOCOR) 40 MG tablet, Take 1 tablet (40 mg total) by mouth at bedtime., Disp: 90 tablet, Rfl: 3 Medications Discontinued During This Encounter  Medication Reason  . levETIRAcetam (KEPPRA) 500 MG tablet    Problem List Items Addressed This Visit      Other   Preventative health care - Primary    USPSTF grade A and B recommendations reviewed with patient; age-appropriate recommendations, preventive care, screening tests, etc discussed and encouraged; healthy living encouraged; see AVS for patient education given to patient      Breast cancer screening    Patient wishes to decrease testing to every other year; continue monthly SBE          Next Medicare Wellness Visit in 12+ months

## 2016-11-20 NOTE — Assessment & Plan Note (Signed)
Patient wishes to decrease testing to every other year; continue monthly SBE

## 2017-01-06 ENCOUNTER — Encounter: Payer: Self-pay | Admitting: Family Medicine

## 2017-01-06 ENCOUNTER — Ambulatory Visit (INDEPENDENT_AMBULATORY_CARE_PROVIDER_SITE_OTHER): Payer: PPO | Admitting: Family Medicine

## 2017-01-06 DIAGNOSIS — E669 Obesity, unspecified: Secondary | ICD-10-CM

## 2017-01-06 DIAGNOSIS — E041 Nontoxic single thyroid nodule: Secondary | ICD-10-CM

## 2017-01-06 DIAGNOSIS — M8580 Other specified disorders of bone density and structure, unspecified site: Secondary | ICD-10-CM

## 2017-01-06 DIAGNOSIS — E063 Autoimmune thyroiditis: Secondary | ICD-10-CM | POA: Diagnosis not present

## 2017-01-06 DIAGNOSIS — E66811 Obesity, class 1: Secondary | ICD-10-CM

## 2017-01-06 DIAGNOSIS — F19982 Other psychoactive substance use, unspecified with psychoactive substance-induced sleep disorder: Secondary | ICD-10-CM | POA: Diagnosis not present

## 2017-01-06 DIAGNOSIS — G40802 Other epilepsy, not intractable, without status epilepticus: Secondary | ICD-10-CM | POA: Diagnosis not present

## 2017-01-06 DIAGNOSIS — E782 Mixed hyperlipidemia: Secondary | ICD-10-CM | POA: Diagnosis not present

## 2017-01-06 DIAGNOSIS — E038 Other specified hypothyroidism: Secondary | ICD-10-CM | POA: Diagnosis not present

## 2017-01-06 DIAGNOSIS — R739 Hyperglycemia, unspecified: Secondary | ICD-10-CM | POA: Diagnosis not present

## 2017-01-06 DIAGNOSIS — E559 Vitamin D deficiency, unspecified: Secondary | ICD-10-CM

## 2017-01-06 DIAGNOSIS — Z5181 Encounter for therapeutic drug level monitoring: Secondary | ICD-10-CM

## 2017-01-06 HISTORY — DX: Obesity, class 1: E66.811

## 2017-01-06 HISTORY — DX: Obesity, unspecified: E66.9

## 2017-01-06 LAB — COMPLETE METABOLIC PANEL WITH GFR
ALT: 12 U/L (ref 6–29)
AST: 18 U/L (ref 10–35)
Albumin: 3.9 g/dL (ref 3.6–5.1)
Alkaline Phosphatase: 70 U/L (ref 33–130)
BILIRUBIN TOTAL: 0.7 mg/dL (ref 0.2–1.2)
BUN: 17 mg/dL (ref 7–25)
CALCIUM: 10.2 mg/dL (ref 8.6–10.4)
CO2: 25 mmol/L (ref 20–32)
CREATININE: 0.97 mg/dL (ref 0.50–0.99)
Chloride: 100 mmol/L (ref 98–110)
GFR, EST AFRICAN AMERICAN: 69 mL/min (ref >=60)
GFR, Est Non African American: 60 mL/min (ref >=60)
Glucose, Bld: 91 mg/dL (ref 65–99)
Potassium: 3.8 mmol/L (ref 3.5–5.3)
Sodium: 142 mmol/L (ref 135–146)
TOTAL PROTEIN: 6.6 g/dL (ref 6.1–8.1)

## 2017-01-06 LAB — LIPID PANEL
CHOL/HDL RATIO: 2 ratio (ref <5.0)
Cholesterol: 167 mg/dL (ref <200)
HDL: 82 mg/dL (ref >50)
LDL CALC: 69 mg/dL (ref <100)
Triglycerides: 78 mg/dL (ref <150)
VLDL: 16 mg/dL (ref <30)

## 2017-01-06 NOTE — Assessment & Plan Note (Signed)
Monitored by endocrinologist.

## 2017-01-06 NOTE — Assessment & Plan Note (Signed)
Check liver and kidneys 

## 2017-01-06 NOTE — Assessment & Plan Note (Signed)
No seizures in 2 years; she'll f/u with Dr. Melrose Nakayama in November; knows to avoid ladders and swimming pools, etc

## 2017-01-06 NOTE — Patient Instructions (Addendum)
Avoid black licorice Try to follow the DASH guidelines (DASH stands for Dietary Approaches to Stop Hypertension) Try to limit the sodium in your diet.  Ideally, consume less than 1.5 grams (less than 1,500mg ) per day. Do not add salt when cooking or at the table.  Check the sodium amount on labels when shopping, and choose items lower in sodium when given a choice. Avoid or limit foods that already contain a lot of sodium. Eat a diet rich in fruits and vegetables and whole grains.

## 2017-01-06 NOTE — Assessment & Plan Note (Signed)
Check lipids today (fasting); continue medicine

## 2017-01-06 NOTE — Progress Notes (Signed)
BP 126/72   Pulse 95   Temp 98.1 F (36.7 C) (Oral)   Resp 14   Wt 180 lb 11.2 oz (82 kg)   SpO2 99%   BMI 32.01 kg/m    Subjective:    Patient ID: Rachel Cox, female    DOB: [birth year], 66 y.o.   MRN: [id number]  HPI: Rachel Cox is a 66 y.o. female  Chief Complaint  Patient presents with  . Hypertension    6 month F/u  . Hyperlipidemia    6 month F/u    HPI Here for f/u  Hypertension; well-controlled today; checking BP away from here and getting same numbers; likes her salt; knows to stay away from decongestants  High cholesterol; that runs in the family; not eating any differently; on statin; no problems Lab Results  Component Value Date   CHOL 183 07/09/2016   CHOL 189 01/04/2016   CHOL 208 (H) 07/04/2015   Lab Results  Component Value Date   HDL 92 07/09/2016   HDL 94 01/04/2016   HDL 90 07/04/2015   Lab Results  Component Value Date   LDLCALC 75 07/09/2016   LDLCALC 78 01/04/2016   LDLCALC 98 07/04/2015   Lab Results  Component Value Date   TRIG 81 07/09/2016   TRIG 85 01/04/2016   TRIG 102 07/04/2015   Lab Results  Component Value Date   CHOLHDL 2.0 07/09/2016   CHOLHDL 2.0 01/04/2016   No results found for: LDLDIRECT  Hyperglycemia; no fam hx of diabetes; no dry mouth; no blurred vision  Seeing Dr. Melrose Nakayama for neurology, seizures; last one was 2 years ago; on medicine but he's wanting to discontinue it; she's a little concerned about that  Hypothyroidism and thyroid nodule Followed by endocrinologist  Insomnia; sleeping well now  Osteopenia; last DEXA 2017; next due 2019; fall precautions; getting enough calcium; trying to walk  Obesity; patient says she is just stuck; working on it  Depression screen [facility] 2/9 01/06/2017 11/20/2016 07/09/2016 01/04/2016 11/03/2015  Decreased Interest 0 0 0 0 0  Down, Depressed, Hopeless 0 0 0 0 0  PHQ - 2 Score 0 0 0 0 0    Relevant past medical, surgical, family and social history reviewed Past  Medical History:  Diagnosis Date  . Allergic rhinitis   . Allergy   . Anemia   . Anxiety   . Depression   . Epilepsy ([place])    managed by Dr. Melrose Nakayama  . History of abnormal cervical Pap smear 1980's  . Hyperglycemia   . Hyperlipidemia   . Hypertension   . Hypothyroidism   . Insomnia   . Obesity (BMI 30.0-34.9) 01/06/2017  . Osteopenia    Past Surgical History:  Procedure Laterality Date  . COLONOSCOPY  Sept 2015  . Cryo Procedure  1980s   for abnormal pap  . TUBAL LIGATION  1969   Family History  Problem Relation Age of Onset  . Hyperlipidemia Mother   . Hypertension Mother   . CAD Mother   . Thyroid disease Mother   . Heart disease Mother   . COPD Father   . Thyroid disease Father   . Cancer Brother        liver  . Alcohol abuse Brother   . Hypertension Brother   . Liver disease Brother   . Thyroid disease Sister   . Diabetes Neg Hx   . Stroke Neg Hx    Social History   Social History  .  Marital status: Married    Spouse name: N/A  . Number of children: N/A  . Years of education: N/A   Occupational History  . Not on file.   Social History Main Topics  . Smoking status: Never Smoker  . Smokeless tobacco: Never Used  . Alcohol use No  . Drug use: No  . Sexual activity: Yes    Birth control/ protection: Post-menopausal   Other Topics Concern  . Not on file   Social History Narrative  . No narrative on file    Interim medical history since last visit reviewed. Allergies and medications reviewed  Review of Systems  Constitutional: Negative for unexpected weight change.  Eyes: Negative for visual disturbance.  Cardiovascular: Negative for chest pain and palpitations.  Genitourinary:       Denies incontinence   Per HPI unless specifically indicated above     Objective:    BP 126/72   Pulse 95   Temp 98.1 F (36.7 C) (Oral)   Resp 14   Wt 180 lb 11.2 oz (82 kg)   SpO2 99%   BMI 32.01 kg/m   Wt Readings from Last 3 Encounters:    01/06/17 180 lb 11.2 oz (82 kg)  11/20/16 179 lb 6.4 oz (81.4 kg)  11/06/16 179 lb (81.2 kg)    Physical Exam  Constitutional: She appears well-developed and well-nourished. No distress.  Eyes: EOM are normal. No scleral icterus.  Neck: No thyromegaly present.  Cardiovascular: Normal rate, regular rhythm and normal heart sounds.   No murmur heard. Pulmonary/Chest: Effort normal and breath sounds normal.  Abdominal: Soft. Bowel sounds are normal. She exhibits no distension.  Musculoskeletal: Normal range of motion. She exhibits no edema.  Neurological: She is alert.  Skin: Skin is warm and dry. No pallor.  Psychiatric: She has a normal mood and affect. Her behavior is normal. Judgment and thought content normal.    Results for orders placed or performed in visit on 11/06/16  TSH  Result Value Ref Range   TSH 1.15 0.35 - 4.50 uIU/mL      Assessment & Plan:   Problem List Items Addressed This Visit      Endocrine   Thyroid nodule    Monitored by endocrinologist      Hypothyroidism    Monitored by endocrinologist        Nervous and Auditory   Epilepsy ([place])    No seizures in 2 years; she'll f/u with Dr. Melrose Nakayama in November; knows to avoid ladders and swimming pools, etc        Musculoskeletal and Integument   Osteopenia    Weight bearing activity, calcium, avoid fall risks; next DEXA in 2019        Other   Vitamin D deficiency    1,000 iu vitamin D3 daily in multiple vitamin plus food plus sun exposure      Obesity (BMI 30.0-34.9)    Patient is working on weight loss, encouragement given      Medication monitoring encounter    Check liver and kidneys      Relevant Orders   COMPLETE METABOLIC PANEL WITH GFR   Insomnia    Controlled, limits caffeine      Hyperlipidemia    Check lipids today (fasting); continue medicine      Relevant Orders   Lipid panel   Hyperglycemia    Check fasting glucose and A1c      Relevant Orders   Hemoglobin A1c  Follow up plan: Return in about 6 months (around 07/09/2017).  An after-visit summary was printed and given to the patient at [facility].  Please see the patient instructions which may contain other information and recommendations beyond what is mentioned above in the assessment and plan.  Meds ordered this encounter  Medications  . Multiple Vitamin (MULTIVITAMIN) tablet    Sig: Take 1 tablet by mouth daily.    Orders Placed This Encounter  Procedures  . COMPLETE METABOLIC PANEL WITH GFR  . Hemoglobin A1c  . Lipid panel

## 2017-01-06 NOTE — Assessment & Plan Note (Signed)
Weight bearing activity, calcium, avoid fall risks; next DEXA in 2019

## 2017-01-06 NOTE — Assessment & Plan Note (Signed)
Check fasting glucose and A1c 

## 2017-01-06 NOTE — Assessment & Plan Note (Signed)
1,000 iu vitamin D3 daily in multiple vitamin plus food plus sun exposure

## 2017-01-06 NOTE — Assessment & Plan Note (Signed)
Patient is working on weight loss, encouragement given

## 2017-01-06 NOTE — Assessment & Plan Note (Signed)
Controlled, limits caffeine

## 2017-01-07 LAB — HEMOGLOBIN A1C
HEMOGLOBIN A1C: 5.2 % (ref <5.7)
MEAN PLASMA GLUCOSE: 103 mg/dL

## 2017-04-10 ENCOUNTER — Telehealth: Payer: Self-pay

## 2017-04-10 NOTE — Telephone Encounter (Signed)
Copied from [place] #[id number]. Topic: Inquiry >> Apr 10, 2017  1:52 PM Arletha Grippe wrote: Reason for CRM: pt got flu shot Oct 1st at [facility]

## 2017-04-16 DIAGNOSIS — R569 Unspecified convulsions: Secondary | ICD-10-CM | POA: Diagnosis not present

## 2017-05-14 ENCOUNTER — Telehealth: Payer: Self-pay | Admitting: Endocrinology

## 2017-05-14 NOTE — Telephone Encounter (Signed)
Pt called and needs refill of the following medication  She stated she needs a 6 month supply, since her f/u is 6 month away    levothyroxine (SYNTHROID, LEVOTHROID) 75 MCG tablet    [facility] [address] [place] [facility] [address]

## 2017-05-15 ENCOUNTER — Other Ambulatory Visit: Payer: Self-pay | Admitting: Endocrinology

## 2017-07-09 ENCOUNTER — Encounter: Payer: Self-pay | Admitting: Family Medicine

## 2017-07-09 ENCOUNTER — Ambulatory Visit (INDEPENDENT_AMBULATORY_CARE_PROVIDER_SITE_OTHER): Payer: PPO | Admitting: Family Medicine

## 2017-07-09 DIAGNOSIS — Z5181 Encounter for therapeutic drug level monitoring: Secondary | ICD-10-CM | POA: Diagnosis not present

## 2017-07-09 DIAGNOSIS — E782 Mixed hyperlipidemia: Secondary | ICD-10-CM | POA: Diagnosis not present

## 2017-07-09 DIAGNOSIS — M8580 Other specified disorders of bone density and structure, unspecified site: Secondary | ICD-10-CM

## 2017-07-09 DIAGNOSIS — E039 Hypothyroidism, unspecified: Secondary | ICD-10-CM | POA: Diagnosis not present

## 2017-07-09 DIAGNOSIS — E669 Obesity, unspecified: Secondary | ICD-10-CM | POA: Diagnosis not present

## 2017-07-09 DIAGNOSIS — I10 Essential (primary) hypertension: Secondary | ICD-10-CM

## 2017-07-09 DIAGNOSIS — E559 Vitamin D deficiency, unspecified: Secondary | ICD-10-CM

## 2017-07-09 DIAGNOSIS — N182 Chronic kidney disease, stage 2 (mild): Secondary | ICD-10-CM

## 2017-07-09 DIAGNOSIS — G40802 Other epilepsy, not intractable, without status epilepticus: Secondary | ICD-10-CM | POA: Diagnosis not present

## 2017-07-09 DIAGNOSIS — R739 Hyperglycemia, unspecified: Secondary | ICD-10-CM | POA: Diagnosis not present

## 2017-07-09 NOTE — Assessment & Plan Note (Signed)
Last 3 A1c readings normal; just check fasting glucose

## 2017-07-09 NOTE — Assessment & Plan Note (Signed)
Just mild; avoid NSAIDs; drink plenty of water; check kidney function

## 2017-07-09 NOTE — Patient Instructions (Signed)
Please talk with Dr. Loanne Drilling about losing a few pounds since your BMI is in the obesity range Try to limit saturated fats in your diet (bologna, hot dogs, barbeque, cheeseburgers, hamburgers, steak, bacon, sausage, cheese, etc.) and get more fresh fruits, vegetables, and whole grains Let's get labs today You'll be due for a bone density in late August 2019 If you have not heard anything from my staff in a week about any orders/referrals/studies from today, please contact us here to follow-up [phone]

## 2017-07-09 NOTE — Assessment & Plan Note (Signed)
Managed by endo

## 2017-07-09 NOTE — Assessment & Plan Note (Signed)
Try to lose a little weight; encouraged her to talk with her thyroid specialist about her obesity and the need to lose some weight

## 2017-07-09 NOTE — Assessment & Plan Note (Signed)
Check amount on bottle at home and let us know; check level

## 2017-07-09 NOTE — Assessment & Plan Note (Signed)
Check kidney function 

## 2017-07-09 NOTE — Progress Notes (Signed)
BP 122/78   Pulse 98   Temp 98.4 F (36.9 C) (Oral)   Resp 14   Wt 179 lb 3.2 oz (81.3 kg)   SpO2 96%   BMI 31.74 kg/m    Subjective:    Patient ID: Rachel Cox, female    DOB: 09-01-1950, 67 y.o.   MRN: [id number]  HPI: Rachel Cox is a 67 y.o. female  Chief Complaint  Patient presents with  . Follow-up    HPI Patient is here for f/u She has hypertension; well-controlled; checks BP away from the doctor, controlled away too; adds just a touch of salt to some foods, trying to cut back; checks sodium on labels; no decongestants  High cholesterol; runs in the family; not much meat; chicken, fatty meat on Sundays; 2 eggs a week; some cheese Lab Results  Component Value Date   CHOL 167 01/06/2017   HDL 82 01/06/2017   [place] 69 01/06/2017   TRIG 78 01/06/2017   CHOLHDL 2.0 01/06/2017   Hypothyroidism, treated with 75 mcg synthroid; energy level is good; bowels okay; no hair loss; weight stable; managed by specialist, Dr. Loanne Drilling; no surgery; mother and sister both with thyroid disease Lab Results  Component Value Date   TSH 1.15 11/06/2016   T3TOTAL 123 12/19/2014   T4TOTAL 14.2 (H) 12/19/2014   Hx of hyperglycemia; last 3 A1c have been normal; no diabetes in the family  She has seizure d/o and sees neurologist, Dr. Melrose Nakayama; he prescribes AEDs and mirtazipine Last seizure was 3 years ago Goes to see him Wednesday  Osteopenia; last DEXA August 2017; enough calcium, practicing fall precautions  CKD; no NSAIDs; good water drinker  Vit D deficiency; last check 2017; taking vit D supplement, not sure how much  Depression screen [facility] 2/9 07/09/2017 01/06/2017 11/20/2016 07/09/2016 01/04/2016  Decreased Interest 0 0 0 0 0  Down, Depressed, Hopeless 0 0 0 0 0  PHQ - 2 Score 0 0 0 0 0    Relevant past medical, surgical, family and social history reviewed Past Medical History:  Diagnosis Date  . Allergic rhinitis   . Allergy   . Anemia   . Anxiety   . Depression   .  Epilepsy ([place])    managed by Dr. Melrose Nakayama  . History of abnormal cervical Pap smear 1980's  . Hyperglycemia   . Hyperlipidemia   . Hypertension   . Hypothyroidism   . Insomnia   . Obesity (BMI 30.0-34.9) 01/06/2017  . Osteopenia    Past Surgical History:  Procedure Laterality Date  . COLONOSCOPY  Sept 2015  . Cryo Procedure  1980s   for abnormal pap  . TUBAL LIGATION  1969   Family History  Problem Relation Age of Onset  . Hyperlipidemia Mother   . Hypertension Mother   . CAD Mother   . Thyroid disease Mother   . Heart disease Mother   . COPD Father   . Thyroid disease Father   . Cancer Brother        liver  . Alcohol abuse Brother   . Hypertension Brother   . Liver disease Brother   . Thyroid disease Sister   . Diabetes Neg Hx   . Stroke Neg Hx    Social History   Tobacco Use  . Smoking status: Never Smoker  . Smokeless tobacco: Never Used  Substance Use Topics  . Alcohol use: No  . Drug use: No    Interim medical history since last  visit reviewed. Allergies and medications reviewed  Review of Systems  Constitutional: Negative for unexpected weight change.  Respiratory: Negative for shortness of breath.   Cardiovascular: Negative for chest pain and leg swelling.   Per HPI unless specifically indicated above     Objective:    BP 122/78   Pulse 98   Temp 98.4 F (36.9 C) (Oral)   Resp 14   Wt 179 lb 3.2 oz (81.3 kg)   SpO2 96%   BMI 31.74 kg/m   Wt Readings from Last 3 Encounters:  07/09/17 179 lb 3.2 oz (81.3 kg)  01/06/17 180 lb 11.2 oz (82 kg)  11/20/16 179 lb 6.4 oz (81.4 kg)    Physical Exam  Constitutional: She appears well-developed and well-nourished. No distress.  Eyes: EOM are normal. No scleral icterus.  Neck: No thyromegaly present.  Cardiovascular: Normal rate, regular rhythm and normal heart sounds.  No murmur heard. Pulmonary/Chest: Effort normal and breath sounds normal.  Abdominal: Soft. Bowel sounds are normal. She exhibits  no distension.  Musculoskeletal: Normal range of motion. She exhibits no edema.  Neurological: She is alert.  Skin: Skin is warm and dry. No pallor.  Psychiatric: She has a normal mood and affect. Her behavior is normal. Judgment and thought content normal.   Results for orders placed or performed in visit on 01/06/17  COMPLETE METABOLIC PANEL WITH GFR  Result Value Ref Range   Sodium 142 135 - 146 mmol/L   Potassium 3.8 3.5 - 5.3 mmol/L   Chloride 100 98 - 110 mmol/L   CO2 25 20 - 32 mmol/L   Glucose, Bld 91 65 - 99 mg/dL   BUN 17 7 - 25 mg/dL   Creat 0.97 0.50 - 0.99 mg/dL   Total Bilirubin 0.7 0.2 - 1.2 mg/dL   Alkaline Phosphatase 70 33 - 130 U/L   AST 18 10 - 35 U/L   ALT 12 6 - 29 U/L   Total Protein 6.6 6.1 - 8.1 g/dL   Albumin 3.9 3.6 - 5.1 g/dL   Calcium 10.2 8.6 - 10.4 mg/dL   GFR, Est African American 69 >=60 mL/min   GFR, Est Non African American 60 >=60 mL/min  Hemoglobin A1c  Result Value Ref Range   Hgb A1c MFr Bld 5.2 <5.7 %   Mean Plasma Glucose 103 mg/dL  Lipid panel  Result Value Ref Range   Cholesterol 167 <200 mg/dL   Triglycerides 78 <150 mg/dL   HDL 82 >50 mg/dL   Total CHOL/HDL Ratio 2.0 <5.0 Ratio   VLDL 16 <30 mg/dL   LDL Cholesterol 69 <100 mg/dL      Assessment & Plan:   Problem List Items Addressed This Visit      Cardiovascular and Mediastinum   Hypertension    Controlled; limit salt, try the DASH guidelines        Endocrine   Hypothyroidism    Managed by endo        Nervous and Auditory   Epilepsy ([place])    Last seizure 3 years ago; managed by specialist      Relevant Medications   lamoTRIgine (LAMICTAL) 25 MG tablet     Musculoskeletal and Integument   Osteopenia    Fall precautions; next DEXA August 2019      Relevant Orders   VITAMIN D 25 Hydroxy (Vit-D Deficiency, Fractures)     Genitourinary   Chronic kidney disease, stage II (mild)    Just mild; avoid NSAIDs; drink plenty of  water; check kidney function          Other   Vitamin D deficiency    Check amount on bottle at home and let us know; check level      Relevant Orders   VITAMIN D 25 Hydroxy (Vit-D Deficiency, Fractures)   Obesity (BMI 30.0-34.9)    Try to lose a little weight; encouraged her to talk with her thyroid specialist about her obesity and the need to lose some weight      Medication monitoring encounter    Check kidney function      Relevant Orders   CBC with Differential/Platelet   COMPLETE METABOLIC PANEL WITH GFR   Hyperlipidemia    Check lipids today; continue medicine, limit saturated fats      Relevant Orders   Lipid panel   Hyperglycemia    Last 3 A1c readings normal; just check fasting glucose          Follow up plan: Return in about 6 months (around 01/06/2018) for twenty minute follow-up with fasting labs.  An after-visit summary was printed and given to the patient at [place].  Please see the patient instructions which may contain other information and recommendations beyond what is mentioned above in the assessment and plan.  No orders of the defined types were placed in this encounter.   Orders Placed This Encounter  Procedures  . CBC with Differential/Platelet  . COMPLETE METABOLIC PANEL WITH GFR  . Lipid panel  . VITAMIN D 25 Hydroxy (Vit-D Deficiency, Fractures)

## 2017-07-09 NOTE — Assessment & Plan Note (Signed)
Last seizure 3 years ago; managed by specialist

## 2017-07-09 NOTE — Assessment & Plan Note (Signed)
Check lipids today; continue medicine, limit saturated fats

## 2017-07-09 NOTE — Assessment & Plan Note (Signed)
Fall precautions; next DEXA August 2019

## 2017-07-09 NOTE — Assessment & Plan Note (Signed)
Controlled; limit salt, try the DASH guidelines

## 2017-07-10 ENCOUNTER — Telehealth: Payer: Self-pay

## 2017-07-10 DIAGNOSIS — D649 Anemia, unspecified: Secondary | ICD-10-CM

## 2017-07-10 NOTE — Telephone Encounter (Signed)
Called pt informed her of the information below she DECLINES referral to GI at this time. She states that she believes her anemia is related to the lamotrigine that she is taking. Pt states that she would like to discuss this issue with Dr.Potter before seeing GI or having any further work up.   Labs added on.

## 2017-07-10 NOTE — Telephone Encounter (Signed)
-----   Message from Arnetha Courser, MD sent at 07/10/2017  2:27 PM EST ----- Please refer patient to GI for evaluation of her sudden recurrence of anemia; she should start iron pill daily (I talked with her briefly last night, explained anemic, start iron pill, and we'd call her today); please ADD ON TIBC, ferritin, iron panel Other labs stable Thank you

## 2017-07-10 NOTE — Telephone Encounter (Signed)
I explained to patient that while the lamictal may rarely cause anemia She refused the GI doctor referral She has had two colonoscopies and they did not find anything, when she had bleeding before Stool cards ASAP; she'll pick those up tomorrow Explained that I'd like to recheck CBC to see how quickly her H/H is dropping Low H/H, can cause low oxygen to brain or heart, can be dangerous, can cause stroke or heart attack Take iron pill twice a day for now She reluctantly agreed to come in tomorrow for additional labs, and I'll get CBC, path review, and retic count Advised her to go to the ER if she develops chest pain, confusion, SHOB, etc.

## 2017-07-11 ENCOUNTER — Other Ambulatory Visit: Payer: Self-pay

## 2017-07-11 ENCOUNTER — Other Ambulatory Visit: Payer: Self-pay | Admitting: Family Medicine

## 2017-07-11 DIAGNOSIS — D649 Anemia, unspecified: Secondary | ICD-10-CM | POA: Diagnosis not present

## 2017-07-11 LAB — TEST AUTHORIZATION

## 2017-07-11 LAB — COMPLETE METABOLIC PANEL WITH GFR
AG RATIO: 1.6 (calc) (ref 1.0–2.5)
ALBUMIN MSPROF: 4 g/dL (ref 3.6–5.1)
ALKALINE PHOSPHATASE (APISO): 66 U/L (ref 33–130)
ALT: 9 U/L (ref 6–29)
AST: 15 U/L (ref 10–35)
BILIRUBIN TOTAL: 0.4 mg/dL (ref 0.2–1.2)
BUN / CREAT RATIO: 16 (calc) (ref 6–22)
BUN: 17 mg/dL (ref 7–25)
CHLORIDE: 104 mmol/L (ref 98–110)
CO2: 28 mmol/L (ref 20–32)
Calcium: 10 mg/dL (ref 8.6–10.4)
Creat: 1.08 mg/dL — ABNORMAL HIGH (ref 0.50–0.99)
GFR, Est African American: 61 mL/min/{1.73_m2} (ref >=60)
GFR, Est Non African American: 53 mL/min/{1.73_m2} — ABNORMAL LOW (ref >=60)
GLUCOSE: 94 mg/dL (ref 65–99)
Globulin: 2.5 g/dL (calc) (ref 1.9–3.7)
POTASSIUM: 3.8 mmol/L (ref 3.5–5.3)
Sodium: 139 mmol/L (ref 135–146)
Total Protein: 6.5 g/dL (ref 6.1–8.1)

## 2017-07-11 LAB — IRON,TIBC AND FERRITIN PANEL
%SAT: 1 % — AB (ref 11–50)
Ferritin: 4 ng/mL — ABNORMAL LOW (ref 20–288)
TIBC: 409 ug/dL (ref 250–450)

## 2017-07-11 LAB — CBC WITH DIFFERENTIAL/PLATELET
BASOS ABS: 61 {cells}/uL (ref 0–200)
Basophils Relative: 1.2 %
Eosinophils Absolute: 301 cells/uL (ref 15–500)
Eosinophils Relative: 5.9 %
HCT: 28.5 % — ABNORMAL LOW (ref 35.0–45.0)
Hemoglobin: 8.3 g/dL — ABNORMAL LOW (ref 11.7–15.5)
Lymphs Abs: 1413 cells/uL (ref 850–3900)
MCH: 21.9 pg — ABNORMAL LOW (ref 27.0–33.0)
MCHC: 29.1 g/dL — AB (ref 32.0–36.0)
MCV: 75.2 fL — ABNORMAL LOW (ref 80.0–100.0)
MONOS PCT: 16.8 %
MPV: 9.2 fL (ref 7.5–12.5)
Neutro Abs: 2468 cells/uL (ref 1500–7800)
Neutrophils Relative %: 48.4 %
PLATELETS: 415 10*3/uL — AB (ref 140–400)
RBC: 3.79 10*6/uL — ABNORMAL LOW (ref 3.80–5.10)
RDW: 14.8 % (ref 11.0–15.0)
TOTAL LYMPHOCYTE: 27.7 %
WBC mixed population: 857 cells/uL (ref 200–950)
WBC: 5.1 10*3/uL (ref 3.8–10.8)

## 2017-07-11 LAB — LIPID PANEL
CHOLESTEROL: 167 mg/dL (ref <200)
HDL: 88 mg/dL (ref >50)
LDL CHOLESTEROL (CALC): 62 mg/dL
Non-HDL Cholesterol (Calc): 79 mg/dL (calc) (ref <130)
Total CHOL/HDL Ratio: 1.9 (calc) (ref <5.0)
Triglycerides: 83 mg/dL (ref <150)

## 2017-07-11 LAB — VITAMIN D 25 HYDROXY (VIT D DEFICIENCY, FRACTURES): Vit D, 25-Hydroxy: 38 ng/mL (ref 30–100)

## 2017-07-11 NOTE — Progress Notes (Signed)
Orders for Feb 21st

## 2017-07-11 NOTE — Assessment & Plan Note (Signed)
Check CBC in two weeks

## 2017-07-14 ENCOUNTER — Telehealth: Payer: Self-pay | Admitting: Family Medicine

## 2017-07-14 ENCOUNTER — Other Ambulatory Visit: Payer: Self-pay

## 2017-07-14 DIAGNOSIS — D649 Anemia, unspecified: Secondary | ICD-10-CM

## 2017-07-14 DIAGNOSIS — E611 Iron deficiency: Secondary | ICD-10-CM

## 2017-07-14 LAB — CBC WITH DIFFERENTIAL/PLATELET
Basophils Absolute: 48 cells/uL (ref 0–200)
Basophils Relative: 0.9 %
EOS PCT: 5.7 %
Eosinophils Absolute: 302 cells/uL (ref 15–500)
HEMATOCRIT: 28.5 % — AB (ref 35.0–45.0)
Hemoglobin: 8.4 g/dL — ABNORMAL LOW (ref 11.7–15.5)
Lymphs Abs: 1601 cells/uL (ref 850–3900)
MCH: 22 pg — ABNORMAL LOW (ref 27.0–33.0)
MCHC: 29.5 g/dL — ABNORMAL LOW (ref 32.0–36.0)
MCV: 74.8 fL — ABNORMAL LOW (ref 80.0–100.0)
MONOS PCT: 16.9 %
MPV: 9 fL (ref 7.5–12.5)
NEUTROS PCT: 46.3 %
Neutro Abs: 2454 cells/uL (ref 1500–7800)
PLATELETS: 384 10*3/uL (ref 140–400)
RBC: 3.81 10*6/uL (ref 3.80–5.10)
RDW: 14.9 % (ref 11.0–15.0)
TOTAL LYMPHOCYTE: 30.2 %
WBC mixed population: 896 cells/uL (ref 200–950)
WBC: 5.3 10*3/uL (ref 3.8–10.8)

## 2017-07-14 LAB — RETICULOCYTES
ABS Retic: 60480 {cells}/uL (ref 20000–8000)
Retic Ct Pct: 1.6 %

## 2017-07-14 LAB — PATHOLOGIST SMEAR REVIEW

## 2017-07-14 NOTE — Telephone Encounter (Signed)
Called pt back to explain labs and referral

## 2017-07-14 NOTE — Telephone Encounter (Signed)
Copied from [place]. Topic: Quick Communication - Lab Results >> Jul 14, 2017  9:39 AM Cathrine Muster, CMA wrote: Called patient to inform them of  lab results. When patient returns call, triage nurse may disclose results. >> Jul 14, 2017  2:44 PM Cleaster Corin, NT wrote: Pt. Husband calling to speak with Dr. Sanda Klein or nurse about what is going to be the next step  to take concerning pt. With lab results that were given on today. MR. [place]plun can be reached at [phone]

## 2017-07-16 DIAGNOSIS — I10 Essential (primary) hypertension: Secondary | ICD-10-CM | POA: Diagnosis not present

## 2017-07-16 DIAGNOSIS — R569 Unspecified convulsions: Secondary | ICD-10-CM | POA: Diagnosis not present

## 2017-07-17 ENCOUNTER — Telehealth: Payer: Self-pay

## 2017-07-17 NOTE — Telephone Encounter (Signed)
Pts Levothyroxine 32mcg current manufacturer is out of stock pharmacy would like to know if this can be switched to the manufacturer that are able to get in stock. Please advise

## 2017-07-17 NOTE — Telephone Encounter (Signed)
OK 

## 2017-07-17 NOTE — Telephone Encounter (Signed)
Pharmacy is aware

## 2017-07-21 ENCOUNTER — Encounter: Payer: Self-pay | Admitting: Oncology

## 2017-07-21 ENCOUNTER — Inpatient Hospital Stay: Payer: PPO

## 2017-07-21 ENCOUNTER — Inpatient Hospital Stay: Payer: PPO | Attending: Oncology | Admitting: Oncology

## 2017-07-21 VITALS — BP 134/79 | HR 98 | Temp 98.5°F | Resp 18 | Ht 63.0 in | Wt 174.6 lb

## 2017-07-21 DIAGNOSIS — R221 Localized swelling, mass and lump, neck: Secondary | ICD-10-CM

## 2017-07-21 DIAGNOSIS — D509 Iron deficiency anemia, unspecified: Secondary | ICD-10-CM | POA: Insufficient documentation

## 2017-07-21 LAB — URINALYSIS, COMPLETE (UACMP) WITH MICROSCOPIC
Bilirubin Urine: NEGATIVE
GLUCOSE, UA: NEGATIVE mg/dL
HGB URINE DIPSTICK: NEGATIVE
Ketones, ur: NEGATIVE mg/dL
NITRITE: NEGATIVE
PH: 6 (ref 5.0–8.0)
PROTEIN: NEGATIVE mg/dL
Specific Gravity, Urine: 1.005 (ref 1.005–1.030)

## 2017-07-21 NOTE — Progress Notes (Signed)
Signing off on order document

## 2017-07-21 NOTE — Progress Notes (Signed)
Patient is concern about being at this appointment, and refuse any service. The  Patient states she is fine and will be ok.

## 2017-07-21 NOTE — Progress Notes (Signed)
Hematology/Oncology Consult note [facility] [phone] [phone]   Patient Care Team: Arnetha Courser, MD as PCP - General (Family Medicine) Anabel Bene, MD as Referring Physician (Neurology) Renato Shin, MD as Consulting Physician (Endocrinology)  REFERRING PROVIDER:  CHIEF COMPLAINTS/PURPOSE OF CONSULTATION:  Evaluation of   HISTORY OF PRESENTING ILLNESS:  Rachel Cox is a  67 y.o.  female with PMH listed below who was referred to me for evaluation of anemia.  Patient had labs done with Dr. cornerstone recently.  Labs reviewed hemoglobin 8.4, MCV 74.8, normal platelets and WBC count.  Normal differential.  Ferritin level at 4, TIBC 409 Patient denies seeing any blood in the stool or urine.  She feels chronically fatigued.  Denies any shortness of breath, chest pain, abdominal pain.     Review of Systems  Constitutional: Positive for malaise/fatigue. Negative for diaphoresis, fever and weight loss.  HENT: Negative for nosebleeds.   Eyes: Negative for blurred vision.  Respiratory: Negative for cough.   Cardiovascular: Negative for chest pain and palpitations.  Gastrointestinal: Negative for abdominal pain, blood in stool, heartburn, nausea and vomiting.  Genitourinary: Negative for dysuria and urgency.  Musculoskeletal: Negative for back pain and myalgias.  Skin: Negative for rash.  Neurological: Negative for dizziness.  Endo/Heme/Allergies: Does not bruise/bleed easily.  Psychiatric/Behavioral: Negative for depression.    MEDICAL HISTORY:  Past Medical History:  Diagnosis Date  . Allergic rhinitis   . Allergy   . Anemia   . Anxiety   . Depression   . Epilepsy ([place])    managed by Dr. Melrose Nakayama  . History of abnormal cervical Pap smear 1980's  . Hyperglycemia   . Hyperlipidemia   . Hypertension   . Hypothyroidism   . Insomnia   . Obesity (BMI 30.0-34.9) 01/06/2017  . Osteopenia     SURGICAL HISTORY: Past  Surgical History:  Procedure Laterality Date  . COLONOSCOPY  Sept 2015  . Cryo Procedure  1980s   for abnormal pap  . TUBAL LIGATION  1969    SOCIAL HISTORY: Social History   Socioeconomic History  . Marital status: Married    Spouse name: Not on file  . Number of children: Not on file  . Years of education: Not on file  . Highest education level: Not on file  Social Needs  . Financial resource strain: Not on file  . Food insecurity - worry: Not on file  . Food insecurity - inability: Not on file  . Transportation needs - medical: Not on file  . Transportation needs - non-medical: Not on file  Occupational History  . Not on file  Tobacco Use  . Smoking status: Never Smoker  . Smokeless tobacco: Never Used  Substance and Sexual Activity  . Alcohol use: No  . Drug use: No  . Sexual activity: Yes    Birth control/protection: Post-menopausal  Other Topics Concern  . Not on file  Social History Narrative  . Not on file    FAMILY HISTORY: Family History  Problem Relation Age of Onset  . Hyperlipidemia Mother   . Hypertension Mother   . CAD Mother   . Thyroid disease Mother   . Heart disease Mother   . COPD Father   . Thyroid disease Father   . Cancer Brother        liver  . Alcohol abuse Brother   . Hypertension Brother   . Liver disease Brother   . Thyroid disease Sister   .  Diabetes Neg Hx   . Stroke Neg Hx     ALLERGIES:  is allergic to aspirin and codeine.  MEDICATIONS:  Current Outpatient Medications  Medication Sig Dispense Refill  . Cyanocobalamin (VITAMIN B-12 PO) Take 400 mcg by mouth daily.     . ferrous sulfate (IRON SUPPLEMENT) 325 (65 FE) MG tablet Take 65 mg by mouth daily with breakfast.    . folic acid (FOLVITE) 093 MCG tablet Take 400 mcg by mouth daily.    Marland Kitchen levETIRAcetam (KEPPRA) 250 MG tablet Take 250 mg by mouth daily. Patient takes one in the morning and one at night.    . levothyroxine (SYNTHROID, LEVOTHROID) 75 MCG tablet TAKE 1  TABLET BY MOUTH ONCE DAILY BEFORE BREAKFAST 30 tablet 5  . losartan-hydrochlorothiazide (HYZAAR) 100-25 MG tablet Take 1 tablet by mouth daily. 90 tablet 3  . mirtazapine (REMERON) 15 MG tablet 15 mg. Take 1 tablet daily    . Multiple Vitamin (MULTIVITAMIN) tablet Take 1 tablet by mouth daily.    . simvastatin (ZOCOR) 40 MG tablet Take 1 tablet (40 mg total) by mouth at bedtime. 90 tablet 3  . fluticasone (FLONASE) 50 MCG/ACT nasal spray Place 2 sprays into both nostrils daily. (Patient not taking: Reported on 07/09/2017) 48 g 3  . lamoTRIgine (LAMICTAL) 25 MG tablet Take 100 mg by mouth 2 (two) times daily.     No current facility-administered medications for this visit.      PHYSICAL EXAMINATION: ECOG PERFORMANCE STATUS: 1 - Symptomatic but completely ambulatory Vitals:   07/21/17 1502  BP: 134/79  Pulse: 98  Resp: 18  Temp: 98.5 F (36.9 C)   Filed Weights   07/21/17 1502  Weight: 174 lb 9.6 oz (79.2 kg)    Physical Exam  Constitutional: She is oriented to person, place, and time and well-developed, well-nourished, and in no distress. No distress.  HENT:  Head: Normocephalic and atraumatic.  Mouth/Throat: No oropharyngeal exudate.  Eyes: Conjunctivae are normal. Pupils are equal, round, and reactive to light. No scleral icterus.  Neck: Normal range of motion. Neck supple.  Big soft tissue mass at the posterior neck.  Cardiovascular: Normal rate and normal heart sounds.  No murmur heard. Pulmonary/Chest: Effort normal and breath sounds normal.  Abdominal: Soft. Bowel sounds are normal. She exhibits no distension.  Musculoskeletal: Normal range of motion. She exhibits no edema.  Lymphadenopathy:    She has no cervical adenopathy.  Neurological: She is alert and oriented to person, place, and time. No cranial nerve deficit.  Skin: Skin is warm and dry.  Psychiatric: Affect and judgment normal.     LABORATORY DATA:  I have reviewed the data as listed Lab Results    Component Value Date   WBC 5.3 07/11/2017   HGB 8.4 (L) 07/11/2017   HCT 28.5 (L) 07/11/2017   MCV 74.8 (L) 07/11/2017   PLT 384 07/11/2017   Recent Labs    01/06/17 0928 07/09/17 0832  NA 142 139  K 3.8 3.8  CL 100 104  CO2 25 28  GLUCOSE 91 94  BUN 17 17  CREATININE 0.97 1.08*  CALCIUM 10.2 10.0  GFRNONAA 60 53*  GFRAA 69 61  PROT 6.6 6.5  ALBUMIN 3.9  --   AST 18 15  ALT 12 9  ALKPHOS 70  --   BILITOT 0.7 0.4       ASSESSMENT & PLAN:  1. Iron deficiency anemia, unspecified iron deficiency anemia type   2. Mass in neck  Discussed with patient that patient's anemia is most likely secondary to iron deficiency.Plan IV iron with Feraheme 510 weekly x 2, Allergy reactions/infusion reaction including anaphylactic reaction discussed with patient. Patient voices understanding and willing to proceed.  # check stool occult and UA.  She will need GI workup.  Patient would like to have anemia corrected and then will discuss about GI referral.  All questions were answered. The patient knows to call the clinic with any problems questions or concerns.  Return of visit: 4 weeks with repeat CBC, iron, TIBC, ferritin to be done prior to visit. Thank you for this kind referral and the opportunity to participate in the care of this patient. A copy of today's note is routed to referring provider    Earlie Server, MD, PhD Hematology Oncology [facility] Pager- [phone] 07/21/2017

## 2017-07-22 DIAGNOSIS — R921 Mammographic calcification found on diagnostic imaging of breast: Secondary | ICD-10-CM | POA: Diagnosis not present

## 2017-07-22 DIAGNOSIS — Z1231 Encounter for screening mammogram for malignant neoplasm of breast: Secondary | ICD-10-CM | POA: Diagnosis not present

## 2017-07-22 LAB — HM MAMMOGRAPHY

## 2017-07-28 ENCOUNTER — Other Ambulatory Visit: Payer: Self-pay | Admitting: Oncology

## 2017-07-28 ENCOUNTER — Inpatient Hospital Stay: Payer: PPO

## 2017-07-28 ENCOUNTER — Ambulatory Visit
Admission: RE | Admit: 2017-07-28 | Discharge: 2017-07-28 | Disposition: A | Discharge status: Home / Self Care | Payer: PPO | Source: Ambulatory Visit | Attending: Oncology | Admitting: Oncology

## 2017-07-28 VITALS — BP 111/74 | HR 90 | Resp 20

## 2017-07-28 DIAGNOSIS — R221 Localized swelling, mass and lump, neck: Secondary | ICD-10-CM | POA: Diagnosis not present

## 2017-07-28 DIAGNOSIS — D509 Iron deficiency anemia, unspecified: Secondary | ICD-10-CM | POA: Insufficient documentation

## 2017-07-28 MED ORDER — SODIUM CHLORIDE 0.9 % IV SOLN
Freq: Once | INTRAVENOUS | Status: AC
  Administered 2017-07-28: 14:00:00 via INTRAVENOUS
  Filled 2017-07-28: qty 1000

## 2017-07-28 MED ORDER — FERUMOXYTOL INJECTION 510 MG/17 ML
510.0000 mg | Freq: Once | INTRAVENOUS | Status: AC
  Administered 2017-07-28: 510 mg via INTRAVENOUS
  Filled 2017-07-28: qty 17

## 2017-07-29 DIAGNOSIS — D509 Iron deficiency anemia, unspecified: Secondary | ICD-10-CM | POA: Diagnosis not present

## 2017-07-30 DIAGNOSIS — D509 Iron deficiency anemia, unspecified: Secondary | ICD-10-CM | POA: Diagnosis not present

## 2017-07-31 DIAGNOSIS — D509 Iron deficiency anemia, unspecified: Secondary | ICD-10-CM | POA: Diagnosis not present

## 2017-08-04 ENCOUNTER — Inpatient Hospital Stay: Payer: PPO | Attending: Oncology

## 2017-08-04 ENCOUNTER — Other Ambulatory Visit: Payer: Self-pay

## 2017-08-04 VITALS — BP 130/81 | HR 78 | Temp 98.6°F | Resp 18

## 2017-08-04 DIAGNOSIS — D509 Iron deficiency anemia, unspecified: Secondary | ICD-10-CM

## 2017-08-04 DIAGNOSIS — R221 Localized swelling, mass and lump, neck: Secondary | ICD-10-CM | POA: Insufficient documentation

## 2017-08-04 LAB — OCCULT BLOOD X 1 CARD TO LAB, STOOL
FECAL OCCULT BLD: NEGATIVE
FECAL OCCULT BLD: POSITIVE — AB
Fecal Occult Bld: NEGATIVE

## 2017-08-04 MED ORDER — SODIUM CHLORIDE 0.9 % IV SOLN
510.0000 mg | Freq: Once | INTRAVENOUS | Status: AC
  Administered 2017-08-04: 510 mg via INTRAVENOUS
  Filled 2017-08-04: qty 17

## 2017-08-04 MED ORDER — SODIUM CHLORIDE 0.9 % IV SOLN
Freq: Once | INTRAVENOUS | Status: AC
  Administered 2017-08-04: 14:00:00 via INTRAVENOUS
  Filled 2017-08-04: qty 1000

## 2017-08-06 ENCOUNTER — Telehealth: Payer: Self-pay | Admitting: *Deleted

## 2017-08-06 NOTE — Telephone Encounter (Signed)
Notified patient of fecal blood test. Per Dr. Tasia Catchings gave her the choice of a GI referral or to discuss positive results. Patient would like to discuss results with Dr. Dennis Bast at her next appt. She will bring her husband.

## 2017-08-18 ENCOUNTER — Inpatient Hospital Stay: Payer: PPO

## 2017-08-18 DIAGNOSIS — D509 Iron deficiency anemia, unspecified: Secondary | ICD-10-CM | POA: Diagnosis not present

## 2017-08-18 LAB — CBC WITH DIFFERENTIAL/PLATELET
BASOS ABS: 0.1 10*3/uL (ref 0–0.1)
BASOS PCT: 1 %
Eosinophils Absolute: 0.2 10*3/uL (ref 0–0.7)
Eosinophils Relative: 3 %
HEMATOCRIT: 39.2 % (ref 35.0–47.0)
HEMOGLOBIN: 12.9 g/dL (ref 12.0–16.0)
LYMPHS PCT: 29 %
Lymphs Abs: 2.1 10*3/uL (ref 1.0–3.6)
MCH: 28.6 pg (ref 26.0–34.0)
MCHC: 32.9 g/dL (ref 32.0–36.0)
MCV: 86.9 fL (ref 80.0–100.0)
MONOS PCT: 14 %
Monocytes Absolute: 1 10*3/uL — ABNORMAL HIGH (ref 0.2–0.9)
NEUTROS ABS: 3.8 10*3/uL (ref 1.4–6.5)
NEUTROS PCT: 53 %
Platelets: 290 10*3/uL (ref 150–440)
RBC: 4.51 MIL/uL (ref 3.80–5.20)
RDW: 28.3 % — ABNORMAL HIGH (ref 11.5–14.5)
WBC: 7.2 10*3/uL (ref 3.6–11.0)

## 2017-08-18 LAB — IRON AND TIBC
Iron: 84 ug/dL (ref 28–170)
SATURATION RATIOS: 29 % (ref 10.4–31.8)
TIBC: 288 ug/dL (ref 250–450)
UIBC: 204 ug/dL

## 2017-08-18 LAB — FERRITIN: Ferritin: 295 ng/mL (ref 11–307)

## 2017-08-20 ENCOUNTER — Encounter: Payer: Self-pay | Admitting: Oncology

## 2017-08-20 ENCOUNTER — Inpatient Hospital Stay (HOSPITAL_BASED_OUTPATIENT_CLINIC_OR_DEPARTMENT_OTHER): Payer: PPO | Admitting: Oncology

## 2017-08-20 ENCOUNTER — Inpatient Hospital Stay: Payer: PPO

## 2017-08-20 VITALS — BP 151/84 | HR 93 | Temp 96.9°F | Resp 18 | Wt 176.4 lb

## 2017-08-20 DIAGNOSIS — R221 Localized swelling, mass and lump, neck: Secondary | ICD-10-CM

## 2017-08-20 DIAGNOSIS — D509 Iron deficiency anemia, unspecified: Secondary | ICD-10-CM | POA: Diagnosis not present

## 2017-08-20 DIAGNOSIS — R195 Other fecal abnormalities: Secondary | ICD-10-CM

## 2017-08-20 NOTE — Progress Notes (Signed)
No treatment today per Dr. Yu.  

## 2017-08-20 NOTE — Progress Notes (Signed)
Pt in for follow up, denies any concerns or difficulties.   °

## 2017-08-20 NOTE — Progress Notes (Signed)
Hematology/Oncology Consult note [facility] Telephone:([phone] [phone]   Patient Care Team: Arnetha Courser, MD as PCP - General (Family Medicine) Anabel Bene, MD as Referring Physician (Neurology) Renato Shin, MD as Consulting Physician (Endocrinology)  REFERRING PROVIDER:  CHIEF COMPLAINTS/PURPOSE OF CONSULTATION:  Evaluation of   HISTORY OF PRESENTING ILLNESS:  Rachel Cox is a  67 y.o.  female with PMH listed below who was referred to me for evaluation of anemia.  Patient had labs done with Dr. cornerstone recently.  Labs reviewed hemoglobin 8.4, MCV 74.8, normal platelets and WBC count.  Normal differential.  Ferritin level at 4, TIBC 409 Patient denies seeing any blood in the stool or urine.  She feels chronically fatigued.  Denies any shortness of breath, chest pain, abdominal pain.  Occult stool positive x 1.  S/P IV Ferahemex 2 doses. Hemoglobin improves as well as iron panel. She also has subjective feels better with good energy level.   Review of Systems  Constitutional: Negative for diaphoresis, fever, malaise/fatigue and weight loss.  HENT: Negative for nosebleeds.   Eyes: Negative for blurred vision and pain.  Respiratory: Negative for cough and sputum production.   Cardiovascular: Negative for chest pain, palpitations and claudication.  Gastrointestinal: Negative for abdominal pain, blood in stool, heartburn, nausea and vomiting.  Genitourinary: Negative for dysuria, hematuria and urgency.  Musculoskeletal: Negative for back pain and myalgias.  Skin: Negative for rash.  Neurological: Negative for dizziness and tremors.  Endo/Heme/Allergies: Does not bruise/bleed easily.  Psychiatric/Behavioral: Negative for depression. The patient is not nervous/anxious.     MEDICAL HISTORY:  Past Medical History:  Diagnosis Date  . Allergic rhinitis   . Allergy   . Anemia   . Anxiety   . Depression   . Epilepsy ([place])    managed by Dr. Melrose Nakayama  . History of abnormal cervical Pap smear 1980's  . Hyperglycemia   . Hyperlipidemia   . Hypertension   . Hypothyroidism   . Insomnia   . Obesity (BMI 30.0-34.9) 01/06/2017  . Osteopenia     SURGICAL HISTORY: Past Surgical History:  Procedure Laterality Date  . COLONOSCOPY  Sept 2015  . Cryo Procedure  1980s   for abnormal pap  . TUBAL LIGATION  1969    SOCIAL HISTORY: Social History   Socioeconomic History  . Marital status: Married    Spouse name: Not on file  . Number of children: Not on file  . Years of education: Not on file  . Highest education level: Not on file  Social Needs  . Financial resource strain: Not on file  . Food insecurity - worry: Not on file  . Food insecurity - inability: Not on file  . Transportation needs - medical: Not on file  . Transportation needs - non-medical: Not on file  Occupational History  . Not on file  Tobacco Use  . Smoking status: Never Smoker  . Smokeless tobacco: Never Used  Substance and Sexual Activity  . Alcohol use: No  . Drug use: No  . Sexual activity: Yes    Birth control/protection: Post-menopausal  Other Topics Concern  . Not on file  Social History Narrative  . Not on file    FAMILY HISTORY: Family History  Problem Relation Age of Onset  . Hyperlipidemia Mother   . Hypertension Mother   . CAD Mother   . Thyroid disease Mother   . Heart disease Mother   . COPD Father   . Thyroid  disease Father   . Cancer Brother        liver  . Alcohol abuse Brother   . Hypertension Brother   . Liver disease Brother   . Thyroid disease Sister   . Diabetes Neg Hx   . Stroke Neg Hx     ALLERGIES:  is allergic to aspirin and codeine.  MEDICATIONS:  Current Outpatient Medications  Medication Sig Dispense Refill  . Cyanocobalamin (VITAMIN B-12 PO) Take 400 mcg by mouth daily.     . ferrous sulfate (IRON SUPPLEMENT) 325 (65 FE) MG tablet Take 65 mg by mouth daily with breakfast.    . folic  acid (FOLVITE) 818 MCG tablet Take 400 mcg by mouth daily.    Marland Kitchen lamoTRIgine (LAMICTAL) 25 MG tablet Take 100 mg by mouth 2 (two) times daily.    Marland Kitchen levETIRAcetam (KEPPRA) 250 MG tablet Take 250 mg by mouth daily. Patient takes one in the morning and one at night.    . levothyroxine (SYNTHROID, LEVOTHROID) 75 MCG tablet TAKE 1 TABLET BY MOUTH ONCE DAILY BEFORE BREAKFAST 30 tablet 5  . losartan-hydrochlorothiazide (HYZAAR) 100-25 MG tablet Take 1 tablet by mouth daily. 90 tablet 3  . mirtazapine (REMERON) 15 MG tablet 15 mg. Take 1 tablet daily    . Multiple Vitamin (MULTIVITAMIN) tablet Take 1 tablet by mouth daily.    . simvastatin (ZOCOR) 40 MG tablet Take 1 tablet (40 mg total) by mouth at bedtime. 90 tablet 3  . fluticasone (FLONASE) 50 MCG/ACT nasal spray Place 2 sprays into both nostrils daily. (Patient not taking: Reported on 07/09/2017) 48 g 3   No current facility-administered medications for this visit.      PHYSICAL EXAMINATION: ECOG PERFORMANCE STATUS: 1 - Symptomatic but completely ambulatory Vitals:   08/20/17 1340  BP: (!) 151/84  Pulse: 93  Resp: 18  Temp: (!) 96.9 F (36.1 C)   Filed Weights   08/20/17 1340  Weight: 176 lb 7 oz (80 kg)    Physical Exam  Constitutional: She is oriented to person, place, and time and well-developed, well-nourished, and in no distress. No distress.  HENT:  Head: Normocephalic and atraumatic.  Mouth/Throat: No oropharyngeal exudate.  Eyes: Conjunctivae are normal. Pupils are equal, round, and reactive to light. No scleral icterus.  Neck: Normal range of motion. Neck supple.  Big soft tissue mass at the posterior neck.  Cardiovascular: Normal rate and normal heart sounds.  No murmur heard. Pulmonary/Chest: Effort normal and breath sounds normal. She has no wheezes.  Abdominal: Soft. Bowel sounds are normal. She exhibits no distension. There is no rebound.  Musculoskeletal: Normal range of motion. She exhibits no edema or tenderness.    Lymphadenopathy:    She has no cervical adenopathy.  Neurological: She is alert and oriented to person, place, and time. No cranial nerve deficit.  Skin: Skin is warm and dry. No rash noted.  Psychiatric: Memory, affect and judgment normal.     LABORATORY DATA:  I have reviewed the data as listed Lab Results  Component Value Date   WBC 7.2 08/18/2017   HGB 12.9 08/18/2017   HCT 39.2 08/18/2017   MCV 86.9 08/18/2017   PLT 290 08/18/2017   Recent Labs    01/06/17 0928 07/09/17 0832  NA 142 139  K 3.8 3.8  CL 100 104  CO2 25 28  GLUCOSE 91 94  BUN 17 17  CREATININE 0.97 1.08*  CALCIUM 10.2 10.0  GFRNONAA 60 53*  GFRAA 69 61  PROT 6.6 6.5  ALBUMIN 3.9  --   AST 18 15  ALT 12 9  ALKPHOS 70  --   BILITOT 0.7 0.4    RADIOGRAPHIC STUDIES: I have personally reviewed the radiological images as listed and agreed with the findings in the report. 07/28/2017 US soft tissue IMPRESSION: Well-circumscribed hypoechoic soft tissue mass in the posterior lower neck measuring 6.3 x 1.7 x 5.0 cm by ultrasound. 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 However, soft tissue malignancy cannot be excluded by ultrasound. CT of the neck with IV contrast may be of benefit if there is concern for malignancy based on growth.  ASSESSMENT & PLAN:  1. Iron deficiency anemia, unspecified iron deficiency anemia type   2. Mass in neck   3. Heme positive stool    # Lab results discussed with patient. No need for additional Feraheme at this point.  Discussed about importance of finding source of blood loss. Need workup.  Patient prefer referral to Dr.Byrnett for colonoscopy.  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 Refer to Dr.Byrnett for evaluation.   All questions were answered. The patient knows to call the clinic with any problems questions or concerns.  Return of visit: 4 months.  with repeat CBC, iron, TIBC, ferritin to be done prior to visit. Thank you for this kind referral and the  opportunity to participate in the care of this patient. A copy of today's note is routed to referring provider    Earlie Server, MD, PhD Hematology Oncology [facility] at [facility] Pager- [phone] 08/20/2017

## 2017-08-26 ENCOUNTER — Encounter: Payer: Self-pay | Admitting: Family Medicine

## 2017-08-26 ENCOUNTER — Encounter: Payer: Self-pay | Admitting: General Surgery

## 2017-08-26 ENCOUNTER — Ambulatory Visit (INDEPENDENT_AMBULATORY_CARE_PROVIDER_SITE_OTHER): Payer: PPO | Admitting: General Surgery

## 2017-08-26 VITALS — BP 140/80 | HR 89 | Resp 12 | Ht 66.0 in | Wt 178.0 lb

## 2017-08-26 DIAGNOSIS — K625 Hemorrhage of anus and rectum: Secondary | ICD-10-CM | POA: Diagnosis not present

## 2017-08-26 DIAGNOSIS — D508 Other iron deficiency anemias: Secondary | ICD-10-CM | POA: Diagnosis not present

## 2017-08-26 MED ORDER — SIMVASTATIN 40 MG PO TABS: 40.0000 mg | ORAL_TABLET | Freq: Every day | ORAL | 3 refills | Status: DC

## 2017-08-26 MED ORDER — LOSARTAN POTASSIUM-HCTZ 100-25 MG PO TABS: 1.0000 | ORAL_TABLET | Freq: Every day | ORAL | 3 refills | Status: DC

## 2017-08-26 MED ORDER — POLYETHYLENE GLYCOL 3350 17 GM/SCOOP PO POWD: 1.0000 | Freq: Once | ORAL | 0 refills | Status: AC

## 2017-08-26 MED ORDER — FLUTICASONE PROPIONATE 50 MCG/ACT NA SUSP: 2.0000 | Freq: Every day | NASAL | 3 refills | Status: DC

## 2017-08-26 NOTE — Progress Notes (Signed)
Patient ID: Rachel Cox, female   DOB: 1950-07-21, 67 y.o.   MRN: [id number]  Chief Complaint  Patient presents with  . Colonoscopy    HPI Rachel Cox is a 67 y.o. female here today to discuss a colonoscopy. Last colonoscopy was in 2015. Moves her bowels daily. No blood noticed in her stools. Patient was noted to be anemic during a routine PCP checkup.  She has been evaluated by medical oncology and her anemia improved with IV iron.  Her Hemoccult was Positive.   History of seizures in 2010, controlled with Keppra.  Recurrent seizures 2016 after being tapered off Keppra to institute in a secondary agent.  No additional recurrence since Keppra restarted at a lower dose.   Patient noticed a lump in 2010 on her right lower neck.  Minimal change in size initially identified.  Some mild discomfort with activity.    HPI  Past Medical History:  Diagnosis Date  . Allergic rhinitis   . Allergy   . Anemia   . Anxiety   . Depression   . Epilepsy ([place]) 2014   managed by Dr. Melrose Nakayama  . History of abnormal cervical Pap smear 1980's  . Hyperglycemia   . Hyperlipidemia   . Hypertension   . Hypothyroidism   . Insomnia   . Obesity (BMI 30.0-34.9) 01/06/2017  . Osteopenia     Past Surgical History:  Procedure Laterality Date  . COLONOSCOPY  Sept 2015  . Cryo Procedure  1980s   for abnormal pap  . TUBAL LIGATION  1969    Family History  Problem Relation Age of Onset  . Hyperlipidemia Mother   . Hypertension Mother   . CAD Mother   . Thyroid disease Mother   . Heart disease Mother   . COPD Father   . Thyroid disease Father   . Cancer Brother        liver  . Alcohol abuse Brother   . Hypertension Brother   . Liver disease Brother   . Thyroid disease Sister   . Diabetes Neg Hx   . Stroke Neg Hx     Social History Social History   Tobacco Use  . Smoking status: Never Smoker  . Smokeless tobacco: Never Used  Substance Use Topics  . Alcohol use: No  . Drug use: No     Allergies  Allergen Reactions  . Aspirin Nausea Only  . Codeine Palpitations    Pt vocalized    Current Outpatient Medications  Medication Sig Dispense Refill  . Cyanocobalamin (VITAMIN B-12 PO) Take 400 mcg by mouth daily.     . folic acid (FOLVITE) 409 MCG tablet Take 400 mcg by mouth daily.    Marland Kitchen levETIRAcetam (KEPPRA) 250 MG tablet Take 250 mg by mouth daily.     Marland Kitchen levothyroxine (SYNTHROID, LEVOTHROID) 75 MCG tablet TAKE 1 TABLET BY MOUTH ONCE DAILY BEFORE BREAKFAST 30 tablet 5  . mirtazapine (REMERON) 15 MG tablet 15 mg. Take 1 tablet daily    . Multiple Vitamin (MULTIVITAMIN) tablet Take 1 tablet by mouth daily.    . ferrous sulfate (IRON SUPPLEMENT) 325 (65 FE) MG tablet Take 65 mg by mouth daily with breakfast.    . fluticasone (FLONASE) 50 MCG/ACT nasal spray Place 2 sprays into both nostrils daily. 48 g 3  . losartan-hydrochlorothiazide (HYZAAR) 100-25 MG tablet Take 1 tablet by mouth daily. 90 tablet 3  . polyethylene glycol powder (GLYCOLAX/MIRALAX) powder Take 255 g by mouth once for 1 dose.  Mix whole container with 64 ounces of clear liquids 255 g 0  . simvastatin (ZOCOR) 40 MG tablet Take 1 tablet (40 mg total) by mouth at bedtime. 90 tablet 3   No current facility-administered medications for this visit.     Review of Systems Review of Systems  Constitutional: Negative.   Respiratory: Negative.   Cardiovascular: Negative.     Blood pressure 140/80, pulse 89, resp. rate 12, height 5\' 6"  (1.676 m), weight 178 lb (80.7 kg).  Physical Exam Physical Exam  Constitutional: She is oriented to person, place, and time. She appears well-developed and well-nourished.  Cardiovascular: Normal rate, regular rhythm and normal heart sounds.  Pulmonary/Chest: Effort normal and breath sounds normal.      Neurological: She is alert and oriented to person, place, and time.  Skin: Skin is warm and dry.    8 by 9 mass right lower neck.   Data Reviewed Colonoscopy dated  February 14, 2014 completed by Verdie Shire, MD for screening based on a family history of colon polyps was normal.  A repeat exam in 5 years was recommended.  Serial CBCs between July 09, 2017 and August 18, 2017 reviewed.  Nadir value of 8.3 with an MCV of 75.  Most recent values hemoglobin 12.9 with an MCV of 86.9.  Normal white blood cell count.  Normal platelet count.  Comprehensive metabolic panel July 09, 2017 showed a creatinine of 1.08 with an estimated GFR of 53, normal electrolytes, normal liver function studies.  Serum iron stores showed a marked improvement over the past 2 years.  Ferritin level improved from 4ng/ml  1 month ago rising to 295 last week.  Stool Hemoccult cards from July 31, 2017 were positive.  Two earlier studies that month were negative.  Hematology notes of July 21, 2017 by Earlie Server, MD, PhD reviewed.   Assessment    Profound iron deficiency anemia without clear etiology.  Improved with IV iron therapy.  1/3 stool Hemoccults positive for occult blood.     Plan  Colonoscopy /Upper endoscopy with possible biopsy/polypectomy prn: Information regarding the procedure, including its potential risks and complications (including but not limited to perforation of the bowel, which may require emergency surgery to repair, and bleeding) was verbally given to the patient. Educational information regarding lower intestinal endoscopy was given to the patient. Written instructions for how to complete the bowel prep using Miralax were provided. The importance of drinking ample fluids to avoid dehydration as a result of the prep emphasized.  Possible need for small bowel assessment reviewed, will put on hold pending upper and lower endoscopy.  The posterior neck mass can be resected as an elective procedure as the area is uncomfortable although not significantly enlarging over time.  HPI, Physical Exam, Assessment and Plan have been scribed under the direction and in  the presence of [name], MD.  Gaspar Cola, CMA  I have completed the exam and reviewed the above documentation for accuracy and completeness.  I agree with the above.  Haematologist has been used and any errors in dictation or transcription are unintentional.  [name], M.D., F.A.C.S.  The patient is scheduled for an Upper Endoscopy and Colonoscopy at [facility] on 09/10/17. They are aware to call the day before to get their arrival time. Miralax prescription has been sent into the patient's pharmacy. The patient is aware of date and instructions.  Documented by [name] LPN   Rachel Cox 08/26/2017, 9:18 PM

## 2017-08-26 NOTE — Patient Instructions (Addendum)
Colonoscopy, Adult A colonoscopy is an exam to look at the entire large intestine. During the exam, a lubricated, bendable tube is inserted into the anus and then passed into the rectum, colon, and other parts of the large intestine. A colonoscopy is often done as a part of normal colorectal screening or in response to certain symptoms, such as anemia, persistent diarrhea, abdominal pain, and blood in the stool. The exam can help screen for and diagnose medical problems, including:  Tumors.  Polyps.  Inflammation.  Areas of bleeding.  Tell a health care provider about:  Any allergies you have.  All medicines you are taking, including vitamins, herbs, eye drops, creams, and over-the-counter medicines.  Any problems you or family members have had with anesthetic medicines.  Any blood disorders you have.  Any surgeries you have had.  Any medical conditions you have.  Any problems you have had passing stool. What are the risks? Generally, this is a safe procedure. However, problems may occur, including:  Bleeding.  A tear in the intestine.  A reaction to medicines given during the exam.  Infection (rare).  What happens before the procedure? Eating and drinking restrictions Follow instructions from your health care provider about eating and drinking, which may include:  A few days before the procedure - follow a low-fiber diet. Avoid nuts, seeds, dried fruit, raw fruits, and vegetables.  1-3 days before the procedure - follow a clear liquid diet. Drink only clear liquids, such as clear broth or bouillon, black coffee or tea, clear juice, clear soft drinks or sports drinks, gelatin dessert, and popsicles. Avoid any liquids that contain red or purple dye.  On the day of the procedure - do not eat or drink anything during the 2 hours before the procedure, or within the time period that your health care provider recommends.  Bowel prep If you were prescribed an oral bowel prep  to clean out your colon:  Take it as told by your health care provider. Starting the day before your procedure, you will need to drink a large amount of medicated liquid. The liquid will cause you to have multiple loose stools until your stool is almost clear or light green.  If your skin or anus gets irritated from diarrhea, you may use these to relieve the irritation: ? Medicated wipes, such as adult wet wipes with aloe and vitamin E. ? A skin soothing-product like petroleum jelly.  If you vomit while drinking the bowel prep, take a break for up to 60 minutes and then begin the bowel prep again. If vomiting continues and you cannot take the bowel prep without vomiting, call your health care provider.  General instructions  Ask your health care provider about changing or stopping your regular medicines. This is especially important if you are taking diabetes medicines or blood thinners.  Plan to have someone take you home from the hospital or clinic. What happens during the procedure?  An IV tube may be inserted into one of your veins.  You will be given medicine to help you relax (sedative).  To reduce your risk of infection: ? Your health care team will wash or sanitize their hands. ? Your anal area will be washed with soap.  You will be asked to lie on your side with your knees bent.  Your health care provider will lubricate a long, thin, flexible tube. The tube will have a camera and a light on the end.  The tube will be inserted into your   anus.  The tube will be gently eased through your rectum and colon.  Air will be delivered into your colon to keep it open. You may feel some pressure or cramping.  The camera will be used to take images during the procedure.  A small tissue sample may be removed from your body to be examined under a microscope (biopsy). If any potential problems are found, the tissue will be sent to a lab for testing.  If small polyps are found, your  health care provider may remove them and have them checked for cancer cells.  The tube that was inserted into your anus will be slowly removed. The procedure may vary among health care providers and hospitals. What happens after the procedure?  Your blood pressure, heart rate, breathing rate, and blood oxygen level will be monitored until the medicines you were given have worn off.  Do not drive for 24 hours after the exam.  You may have a small amount of blood in your stool.  You may pass gas and have mild abdominal cramping or bloating due to the air that was used to inflate your colon during the exam.  It is up to you to get the results of your procedure. Ask your health care provider, or the department performing the procedure, when your results will be ready. This information is not intended to replace advice given to you by your health care provider. Make sure you discuss any questions you have with your health care provider. Document Released: 05/17/2000 Document Revised: 03/20/2016 Document Reviewed: 08/01/2015 Elsevier Interactive Patient Education  [employer].  The patient is scheduled for an Upper Endoscopy and Colonoscopy at [facility] on 09/10/17. They are aware to call the day before to get their arrival time. Miralax prescription has been sent into the patient's pharmacy. The patient is aware of date and instructions.

## 2017-09-10 ENCOUNTER — Ambulatory Visit: Payer: PPO | Admitting: Anesthesiology

## 2017-09-10 ENCOUNTER — Encounter
Admission: RE | Disposition: A | Discharge status: Home / Self Care | Payer: Self-pay | Source: Ambulatory Visit | Attending: General Surgery

## 2017-09-10 ENCOUNTER — Encounter: Payer: Self-pay | Admitting: *Deleted

## 2017-09-10 ENCOUNTER — Other Ambulatory Visit: Payer: Self-pay

## 2017-09-10 ENCOUNTER — Ambulatory Visit
Admission: RE | Admit: 2017-09-10 | Discharge: 2017-09-10 | Disposition: A | Discharge status: Home / Self Care | Payer: PPO | Source: Ambulatory Visit | Attending: General Surgery | Admitting: General Surgery

## 2017-09-10 DIAGNOSIS — R195 Other fecal abnormalities: Secondary | ICD-10-CM | POA: Insufficient documentation

## 2017-09-10 DIAGNOSIS — D649 Anemia, unspecified: Secondary | ICD-10-CM | POA: Diagnosis not present

## 2017-09-10 DIAGNOSIS — E039 Hypothyroidism, unspecified: Secondary | ICD-10-CM | POA: Diagnosis not present

## 2017-09-10 DIAGNOSIS — K449 Diaphragmatic hernia without obstruction or gangrene: Secondary | ICD-10-CM | POA: Diagnosis not present

## 2017-09-10 DIAGNOSIS — F419 Anxiety disorder, unspecified: Secondary | ICD-10-CM | POA: Diagnosis not present

## 2017-09-10 DIAGNOSIS — K2210 Ulcer of esophagus without bleeding: Secondary | ICD-10-CM | POA: Insufficient documentation

## 2017-09-10 DIAGNOSIS — Z79899 Other long term (current) drug therapy: Secondary | ICD-10-CM | POA: Diagnosis not present

## 2017-09-10 DIAGNOSIS — D509 Iron deficiency anemia, unspecified: Secondary | ICD-10-CM | POA: Diagnosis not present

## 2017-09-10 DIAGNOSIS — K2950 Unspecified chronic gastritis without bleeding: Secondary | ICD-10-CM | POA: Diagnosis not present

## 2017-09-10 DIAGNOSIS — B9681 Helicobacter pylori [H. pylori] as the cause of diseases classified elsewhere: Secondary | ICD-10-CM | POA: Diagnosis not present

## 2017-09-10 DIAGNOSIS — I10 Essential (primary) hypertension: Secondary | ICD-10-CM | POA: Insufficient documentation

## 2017-09-10 DIAGNOSIS — E785 Hyperlipidemia, unspecified: Secondary | ICD-10-CM | POA: Insufficient documentation

## 2017-09-10 DIAGNOSIS — D508 Other iron deficiency anemias: Secondary | ICD-10-CM

## 2017-09-10 DIAGNOSIS — R569 Unspecified convulsions: Secondary | ICD-10-CM | POA: Insufficient documentation

## 2017-09-10 DIAGNOSIS — N182 Chronic kidney disease, stage 2 (mild): Secondary | ICD-10-CM | POA: Diagnosis not present

## 2017-09-10 DIAGNOSIS — Z79890 Hormone replacement therapy (postmenopausal): Secondary | ICD-10-CM | POA: Diagnosis not present

## 2017-09-10 DIAGNOSIS — K625 Hemorrhage of anus and rectum: Secondary | ICD-10-CM | POA: Diagnosis not present

## 2017-09-10 DIAGNOSIS — K209 Esophagitis, unspecified: Secondary | ICD-10-CM | POA: Diagnosis not present

## 2017-09-10 DIAGNOSIS — Z7951 Long term (current) use of inhaled steroids: Secondary | ICD-10-CM | POA: Insufficient documentation

## 2017-09-10 DIAGNOSIS — I129 Hypertensive chronic kidney disease with stage 1 through stage 4 chronic kidney disease, or unspecified chronic kidney disease: Secondary | ICD-10-CM | POA: Diagnosis not present

## 2017-09-10 DIAGNOSIS — F329 Major depressive disorder, single episode, unspecified: Secondary | ICD-10-CM | POA: Insufficient documentation

## 2017-09-10 HISTORY — PX: COLONOSCOPY WITH PROPOFOL: SHX5780

## 2017-09-10 HISTORY — PX: ESOPHAGOGASTRODUODENOSCOPY (EGD) WITH PROPOFOL: SHX5813

## 2017-09-10 SURGERY — COLONOSCOPY WITH PROPOFOL
Anesthesia: General

## 2017-09-10 MED ORDER — FENTANYL CITRATE (PF) 100 MCG/2ML IJ SOLN
INTRAMUSCULAR | Status: AC
  Filled 2017-09-10: qty 2

## 2017-09-10 MED ORDER — LIDOCAINE HCL (PF) 2 % IJ SOLN
INTRAMUSCULAR | Status: AC
  Filled 2017-09-10: qty 10

## 2017-09-10 MED ORDER — PROPOFOL 500 MG/50ML IV EMUL
INTRAVENOUS | Status: AC
  Filled 2017-09-10: qty 50

## 2017-09-10 MED ORDER — SODIUM CHLORIDE 0.9 % IV SOLN
INTRAVENOUS | Status: DC
  Administered 2017-09-10: 09:00:00 via INTRAVENOUS

## 2017-09-10 MED ORDER — LIDOCAINE 2% (20 MG/ML) 5 ML SYRINGE
INTRAMUSCULAR | Status: DC | PRN
  Administered 2017-09-10: 30 mg via INTRAVENOUS

## 2017-09-10 MED ORDER — PROPOFOL 10 MG/ML IV BOLUS
INTRAVENOUS | Status: DC | PRN
  Administered 2017-09-10: 100 mg via INTRAVENOUS

## 2017-09-10 MED ORDER — PROPOFOL 500 MG/50ML IV EMUL
INTRAVENOUS | Status: DC | PRN
  Administered 2017-09-10: 140 ug/kg/min via INTRAVENOUS

## 2017-09-10 MED ORDER — FENTANYL CITRATE (PF) 100 MCG/2ML IJ SOLN
INTRAMUSCULAR | Status: DC | PRN
  Administered 2017-09-10 (×2): 50 ug via INTRAVENOUS

## 2017-09-10 MED ORDER — PHENYLEPHRINE HCL 10 MG/ML IJ SOLN
INTRAMUSCULAR | Status: DC | PRN
  Administered 2017-09-10 (×2): 100 ug via INTRAVENOUS

## 2017-09-10 NOTE — H&P (Signed)
Rachel Cox [id number] 17-May-1951     HPI: Patient with microcytic anemia and heme positive stools.  For upper and lower endoscopy.   Medications Prior to Admission  Medication Sig Dispense Refill Last Dose  . Cyanocobalamin (VITAMIN B-12 PO) Take 400 mcg by mouth daily.    09/09/2017 at 1000  . fluticasone (FLONASE) 50 MCG/ACT nasal spray Place 2 sprays into both nostrils daily. 48 g 3 Past Week at Unknown time  . folic acid (FOLVITE) 761 MCG tablet Take 400 mcg by mouth daily.   09/09/2017 at 1000  . levETIRAcetam (KEPPRA) 250 MG tablet Take 250 mg by mouth daily.    09/09/2017 at 2300  . levothyroxine (SYNTHROID, LEVOTHROID) 75 MCG tablet TAKE 1 TABLET BY MOUTH ONCE DAILY BEFORE BREAKFAST 30 tablet 5 09/09/2017 at 1000  . losartan-hydrochlorothiazide (HYZAAR) 100-25 MG tablet Take 1 tablet by mouth daily. 90 tablet 3 09/09/2017 at 1000  . mirtazapine (REMERON) 15 MG tablet 15 mg. Take 1 tablet daily   09/09/2017 at 1000  . Multiple Vitamin (MULTIVITAMIN) tablet Take 1 tablet by mouth daily.   09/09/2017 at 1000  . simvastatin (ZOCOR) 40 MG tablet Take 1 tablet (40 mg total) by mouth at bedtime. 90 tablet 3 09/09/2017 at 2300  . ferrous sulfate (IRON SUPPLEMENT) 325 (65 FE) MG tablet Take 65 mg by mouth daily with breakfast.   Not Taking at Unknown time   Allergies  Allergen Reactions  . Aspirin Nausea Only  . Codeine Palpitations    Pt vocalized   Past Medical History:  Diagnosis Date  . Allergic rhinitis   . Allergy   . Anemia   . Anxiety   . Depression   . Epilepsy ([place]) 2014   managed by Dr. Melrose Nakayama  . History of abnormal cervical Pap smear 1980's  . Hyperglycemia   . Hyperlipidemia   . Hypertension   . Hypothyroidism   . Insomnia   . Obesity (BMI 30.0-34.9) 01/06/2017  . Osteopenia    Past Surgical History:  Procedure Laterality Date  . COLONOSCOPY  Sept 2015  . Cryo Procedure  1980s   for abnormal pap  . TUBAL LIGATION  1969   Social History   Socioeconomic History  .  Marital status: Married    Spouse name: Not on file  . Number of children: Not on file  . Years of education: Not on file  . Highest education level: Not on file  Occupational History  . Not on file  Social Needs  . Financial resource strain: Not on file  . Food insecurity:    Worry: Not on file    Inability: Not on file  . Transportation needs:    Medical: Not on file    Non-medical: Not on file  Tobacco Use  . Smoking status: Never Smoker  . Smokeless tobacco: Never Used  Substance and Sexual Activity  . Alcohol use: Yes    Comment: occ  . Drug use: No  . Sexual activity: Yes    Birth control/protection: Post-menopausal  Lifestyle  . Physical activity:    Days per week: Not on file    Minutes per session: Not on file  . Stress: Not on file  Relationships  . Social connections:    Talks on phone: Not on file    Gets together: Not on file    Attends religious service: Not on file    Active member of club or organization: Not on file    Attends meetings of  clubs or organizations: Not on file    Relationship status: Not on file  . Intimate partner violence:    Fear of current or ex partner: Not on file    Emotionally abused: Not on file    Physically abused: Not on file    Forced sexual activity: Not on file  Other Topics Concern  . Not on file  Social History Narrative  . Not on file   Social History   Social History Narrative  . Not on file     ROS: Negative.     PE: HEENT: Negative. Lungs: Clear. Cardio: RR.   Assessment/Plan:  Proceed with planned upper and lower endoscopy.  Forest Gleason [facility] 09/10/2017

## 2017-09-10 NOTE — Transfer of Care (Signed)
Immediate Anesthesia Transfer of Care Note  Patient: Rachel Cox  Procedure(s) Performed: COLONOSCOPY WITH PROPOFOL (N/A ) ESOPHAGOGASTRODUODENOSCOPY (EGD) WITH PROPOFOL (N/A )  Patient Location: PACU and Endoscopy Unit  Anesthesia Type:General  Level of Consciousness: awake and patient cooperative  Airway & Oxygen Therapy: Patient Spontanous Breathing and Patient connected to nasal cannula oxygen  Post-op Assessment: Report given to RN and Post -op Vital signs reviewed and stable  Post vital signs: Reviewed and stable  Last Vitals:  Vitals Value Taken Time  BP    Temp    Pulse 77 09/10/2017 10:22 AM  Resp 19 09/10/2017 10:22 AM  SpO2 99 % 09/10/2017 10:22 AM  Vitals shown include unvalidated device data.  Last Pain:  Vitals:   09/10/17 0844  TempSrc: Tympanic  PainSc: 0-No pain         Complications: No apparent anesthesia complications

## 2017-09-10 NOTE — Anesthesia Post-op Follow-up Note (Signed)
Anesthesia QCDR form completed.        

## 2017-09-10 NOTE — Op Note (Addendum)
[facility] Gastroenterology Patient Name: Rachel Cox Procedure Date: 09/10/2017 9:35 AM MRN: [id number] Account #: [id number] Date of Birth: 08-31-1950 Admit Type: Outpatient Age: 67 Room: [facility] ENDO ROOM 1 Gender: Female Note Status: Finalized Procedure:            Colonoscopy Indications:          Iron deficiency anemia Providers:            Robert Bellow, MD Referring MD:         Arnetha Courser (Referring MD) Medicines:            Monitored Anesthesia Care Complications:        No immediate complications. Procedure:            Pre-Anesthesia Assessment:                       - Prior to the procedure, a History and Physical was                        performed, and patient medications, allergies and                        sensitivities were reviewed. The patient's tolerance of                        previous anesthesia was reviewed.                       - The risks and benefits of the procedure and the                        sedation options and risks were discussed with the                        patient. All questions were answered and informed                        consent was obtained.                       After obtaining informed consent, the colonoscope was                        passed under direct vision. Throughout the procedure,                        the patient's blood pressure, pulse, and oxygen                        saturations were monitored continuously. The                        Colonoscope was introduced through the anus and                        advanced to the the cecum, identified by appendiceal                        orifice and ileocecal valve. The colonoscopy was  performed without difficulty. The patient tolerated the                        procedure well. The quality of the bowel preparation                        was excellent. Findings:      The entire examined colon appeared normal on direct and  retroflexion       views. Impression:           - The entire examined colon is normal on direct and                        retroflexion views.                       - No specimens collected. Recommendation:       - Discharge patient to home (via wheelchair). Procedure Code(s):    --- Professional ---                       [phone], Colonoscopy, flexible; diagnostic, including                        collection of specimen(s) by brushing or washing, when                        performed (separate procedure) Diagnosis Code(s):    --- Professional ---                       D50.9, Iron deficiency anemia, unspecified CPT copyright 2017 American Medical Association. All rights reserved. The codes documented in this report are preliminary and upon coder review may  be revised to meet current compliance requirements. Robert Bellow, MD 09/10/2017 10:17:32 AM This report has been signed electronically. Number of Addenda: 0 Note Initiated On: 09/10/2017 9:35 AM Scope Withdrawal Time: 0 hours 7 minutes 13 seconds  Total Procedure Duration: 0 hours 15 minutes 6 seconds       [facility]

## 2017-09-10 NOTE — Op Note (Signed)
[facility] Gastroenterology Patient Name: Rachel Cox Procedure Date: 09/10/2017 9:34 AM MRN: [id number] Account #: [id number] Date of Birth: 1950-09-07 Admit Type: Outpatient Age: 67 Room: [facility] ENDO ROOM 1 Gender: Female Note Status: Finalized Procedure:            Upper GI endoscopy Indications:          Iron deficiency anemia Providers:            Robert Bellow, MD Referring MD:         Arnetha Courser (Referring MD) Medicines:            Monitored Anesthesia Care Complications:        No immediate complications. Procedure:            Pre-Anesthesia Assessment:                       - Prior to the procedure, a History and Physical was                        performed, and patient medications, allergies and                        sensitivities were reviewed. The patient's tolerance of                        previous anesthesia was reviewed.                       - The risks and benefits of the procedure and the                        sedation options and risks were discussed with the                        patient. All questions were answered and informed                        consent was obtained.                       After obtaining informed consent, the endoscope was                        passed under direct vision. Throughout the procedure,                        the patient's blood pressure, pulse, and oxygen                        saturations were monitored continuously. The Endoscope                        was introduced through the mouth, and advanced to the                        fourth part of duodenum. The upper GI endoscopy was                        accomplished without difficulty. The patient tolerated  the procedure well. Findings:      LA Grade C (one or more mucosal breaks continuous between tops of 2 or       more mucosal folds, less than 75% circumference) esophagitis with no       bleeding was found 23 to 28  cm from the incisors. Biopsies were taken       with a cold forceps for histology.      A medium-sized hiatal hernia was present. The GEJ was at 32 cm and the       diaphragm struck the stomach at 38 cm.      Diffuse mild inflammation characterized by erythema was found in the       prepyloric region of the stomach. Biopsies were taken with a cold       forceps for histology.      The examined duodenum was normal. Impression:           - LA Grade C esophagitis. Biopsied.                       - Medium-sized hiatal hernia.                       - Chronic gastritis. Biopsied.                       - Normal examined duodenum. Recommendation:       - Telephone endoscopist for pathology results in 1 week.                       - Perform a colonoscopy today. Procedure Code(s):    --- Professional ---                       [phone], Esophagogastroduodenoscopy, flexible, transoral;                        with biopsy, single or multiple Diagnosis Code(s):    --- Professional ---                       K20.9, Esophagitis, unspecified                       K44.9, Diaphragmatic hernia without obstruction or                        gangrene                       K29.50, Unspecified chronic gastritis without bleeding                       D50.9, Iron deficiency anemia, unspecified CPT copyright 2017 American Medical Association. All rights reserved. The codes documented in this report are preliminary and upon coder review may  be revised to meet current compliance requirements. Robert Bellow, MD 09/10/2017 9:58:26 AM This report has been signed electronically. Number of Addenda: 0 Note Initiated On: 09/10/2017 9:34 AM      [facility]

## 2017-09-10 NOTE — Anesthesia Postprocedure Evaluation (Signed)
Anesthesia Post Note  Patient: SOTIRIA KEAST  Procedure(s) Performed: COLONOSCOPY WITH PROPOFOL (N/A ) ESOPHAGOGASTRODUODENOSCOPY (EGD) WITH PROPOFOL (N/A )  Patient location during evaluation: Endoscopy Anesthesia Type: General Level of consciousness: awake and alert Pain management: pain level controlled Vital Signs Assessment: post-procedure vital signs reviewed and stable Respiratory status: spontaneous breathing, nonlabored ventilation and respiratory function stable Cardiovascular status: blood pressure returned to baseline and stable Postop Assessment: no apparent nausea or vomiting Anesthetic complications: no     Last Vitals:  Vitals:   09/10/17 1024 09/10/17 1056  BP: 103/64   Pulse: 85   Resp: 14   Temp:    SpO2: 100% 100%    Last Pain:  Vitals:   09/10/17 1056  TempSrc:   PainSc: 0-No pain                 Alphonsus Sias

## 2017-09-10 NOTE — Anesthesia Preprocedure Evaluation (Signed)
Anesthesia Evaluation  Patient identified by MRN, date of birth, ID band Patient awake    Reviewed: Allergy & Precautions, H&P , NPO status , reviewed documented beta blocker date and time   Airway Mallampati: II  TM Distance: >3 FB Neck ROM: limited    Dental  (+) Upper Dentures, Lower Dentures   Pulmonary    Pulmonary exam normal        Cardiovascular hypertension, Normal cardiovascular exam     Neuro/Psych Seizures -,  PSYCHIATRIC DISORDERS Anxiety Depression Last seizure 2016 (was tapered off meds - none since restart)    GI/Hepatic   Endo/Other  Hypothyroidism   Renal/GU Renal disease     Musculoskeletal   Abdominal   Peds  Hematology  (+) anemia ,   Anesthesia Other Findings Stress 10/18/2016 Essential hypertension 10/18/2016 Seizure 02/07/2015 Hyperlipemia, unspecified 11/30/2013 Hypertension 11/30/2013 Hypothyroidism, unspecified 11/30/2013 B12 deficiency 11/30/2013 Iron deficiency 11/30/2013 Seizures 09/21/2013 Hypertension  Scarlet fever  Reproductive/Obstetrics                             Anesthesia Physical Anesthesia Plan  ASA: III  Anesthesia Plan: General   Post-op Pain Management:    Induction:   PONV Risk Score and Plan: 3 and Propofol infusion  Airway Management Planned:   Additional Equipment:   Intra-op Plan:   Post-operative Plan:   Informed Consent: I have reviewed the patients History and Physical, chart, labs and discussed the procedure including the risks, benefits and alternatives for the proposed anesthesia with the patient or authorized representative who has indicated his/her understanding and acceptance.   Dental Advisory Given  Plan Discussed with: CRNA  Anesthesia Plan Comments:         Anesthesia Quick Evaluation

## 2017-09-11 ENCOUNTER — Encounter: Payer: Self-pay | Admitting: General Surgery

## 2017-09-12 LAB — SURGICAL PATHOLOGY

## 2017-09-14 ENCOUNTER — Telehealth: Payer: Self-pay | Admitting: General Surgery

## 2017-09-14 MED ORDER — AMOXICILLIN 500 MG PO CAPS: 1000.0000 mg | ORAL_CAPSULE | Freq: Two times a day (BID) | ORAL | 0 refills | Status: AC

## 2017-09-14 MED ORDER — PANTOPRAZOLE SODIUM 40 MG PO TBEC: 40.0000 mg | DELAYED_RELEASE_TABLET | Freq: Two times a day (BID) | ORAL | 12 refills | Status: DC

## 2017-09-14 MED ORDER — CLARITHROMYCIN 500 MG PO TABS: 500.0000 mg | ORAL_TABLET | Freq: Two times a day (BID) | ORAL | 0 refills | Status: AC

## 2017-09-14 NOTE — Telephone Encounter (Signed)
The patient was notified of the results of her recent biopsy.    Upper and lower endoscopy were completed because of heme positive stools and microcytic anemia.    The colon was normal.    She showed gastritis and also a mid esophageal inflammatory process.  Biopsies showed H. pylori associated gastritis in the esophagus only showed severe esophagitis.  She is not presently taking medicines that I am aware of cause severe esophagitis.  The patient will be asked to hold her Zocor while on treatment to minimize interactions.   She will be put on a clarithromycin based therapy for 14 days and will plan for repeat endoscopy due to the severity of the mid esophageal disease 1 month posttreatment.

## 2017-09-15 ENCOUNTER — Telehealth: Payer: Self-pay

## 2017-09-15 NOTE — Telephone Encounter (Signed)
-----   Message from Robert Bellow, MD sent at 09/14/2017 12:23 PM EDT ----- Please notify the patient that her prescriptions have been sent to her pharmacy.  Instructed that she should not take Zocor while taking these antibiotics to minimize adverse reactions.  Please arrange a follow-up examination in approximately 1 month, will be looking to schedule an upper endoscopy in about 6 weeks. ----- Message ----- From: Interface, Lab In Three Zero One Sent: 09/12/2017   9:32 PM To: Robert Bellow, MD

## 2017-09-15 NOTE — Telephone Encounter (Signed)
Notified patient as instructed, patient pleased. She will hold Zocor while on antibiotics. Discussed follow-up appointments, patient agrees. Patient scheduled to follow up on 10/14/17 at 3:45 pm.

## 2017-09-15 NOTE — Telephone Encounter (Signed)
Called pt per Dr.Lada's verbal orders and informed pt of the need to discontinue simvastatin immediately while taking clarithromycin and not to resume simvastatin until 3 days past completing clarithromycin. Faxed this information to pt's pharmacy also.

## 2017-10-02 ENCOUNTER — Encounter: Payer: Self-pay | Admitting: Endocrinology

## 2017-10-02 ENCOUNTER — Other Ambulatory Visit: Payer: Self-pay

## 2017-10-02 MED ORDER — LEVOTHYROXINE SODIUM 75 MCG PO TABS: ORAL_TABLET | ORAL | 0 refills | Status: DC

## 2017-10-12 IMAGING — CT CT HEAD W/O CM
2 series · 16 of 30 positions shown, 20 images · non-contrast
Comparison: None.

CLINICAL DATA: Seizure. Recent changes and antiseizure medications.

EXAM:
CT HEAD WITHOUT CONTRAST
TECHNIQUE: Contiguous axial images were obtained from the base of the skull
through the vertex without intravenous contrast.

[Series 2: soft tissue · axial · 0.43mm/px · z∈[-226,-181]mm · 3 of 33 slices shown]
[im 3/33  brain]
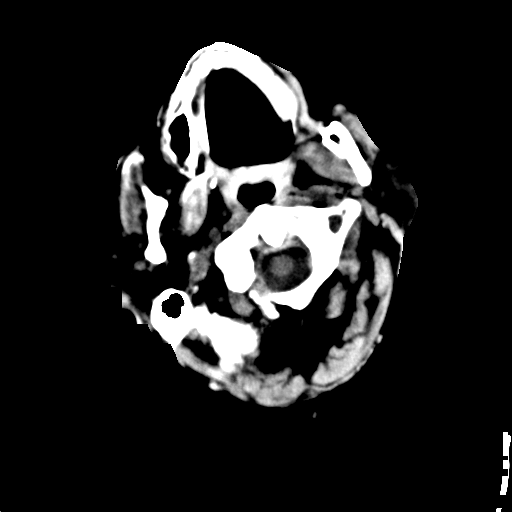
[im 7/33  brain]
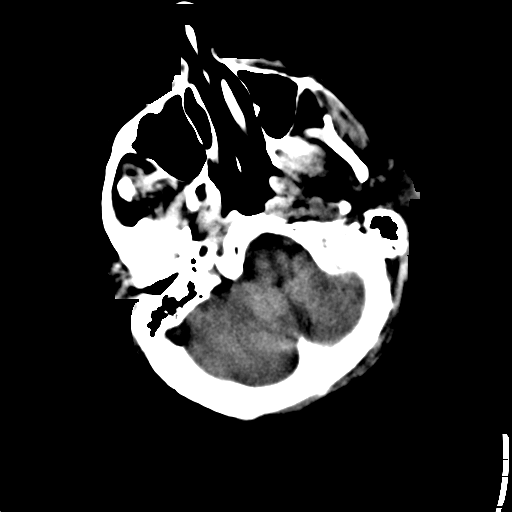
[im 12/33  brain]
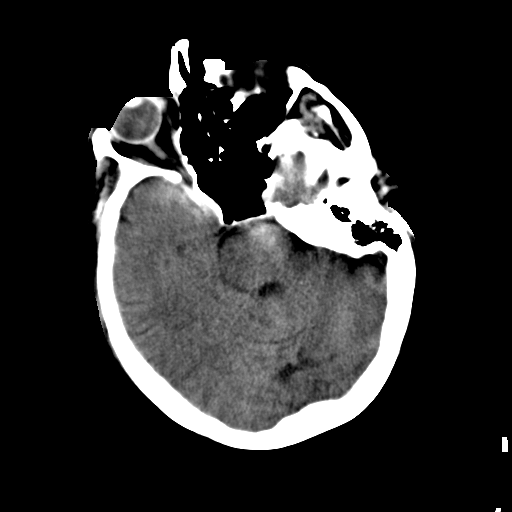

[Series 4: soft tissue recon · axial · 0.42mm/px · z∈[-207,-83]mm · 13 of 32 slices shown, 17 images]
[im 3/32  brain]
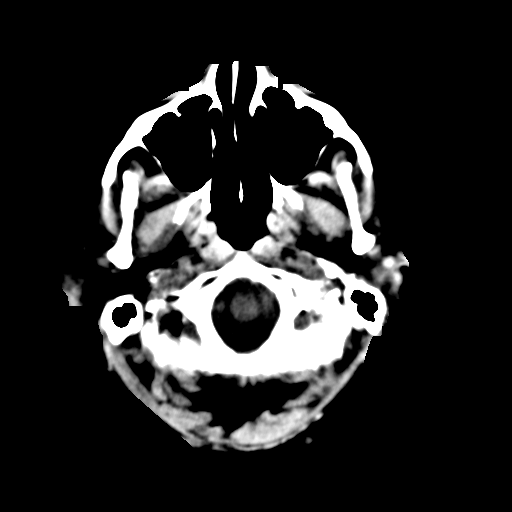
[im 3/32  bone]
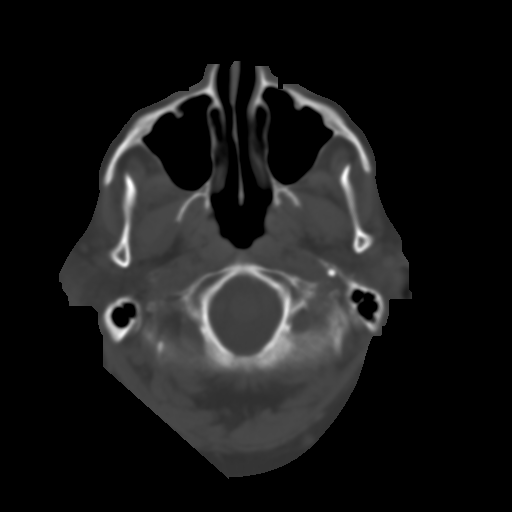
[im 5/32  brain]
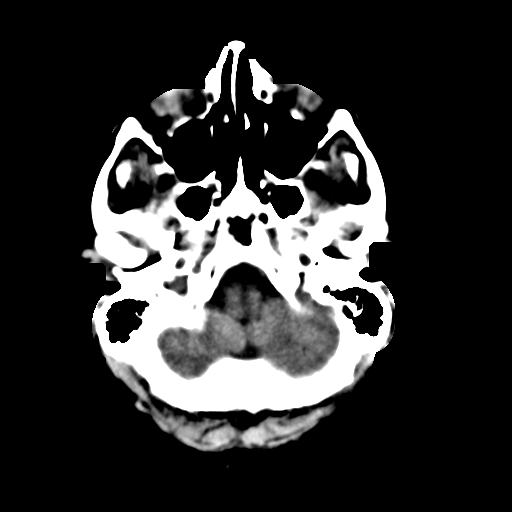
[im 7/32  brain]
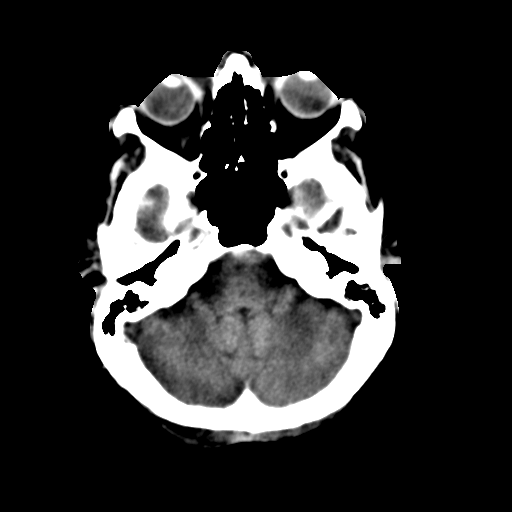
[im 9/32  brain]
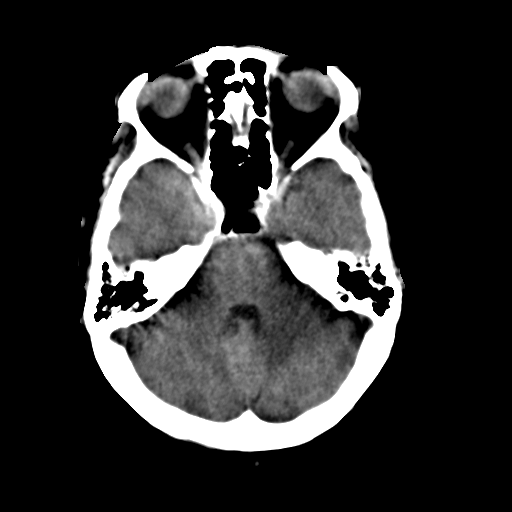
[im 12/32  brain]
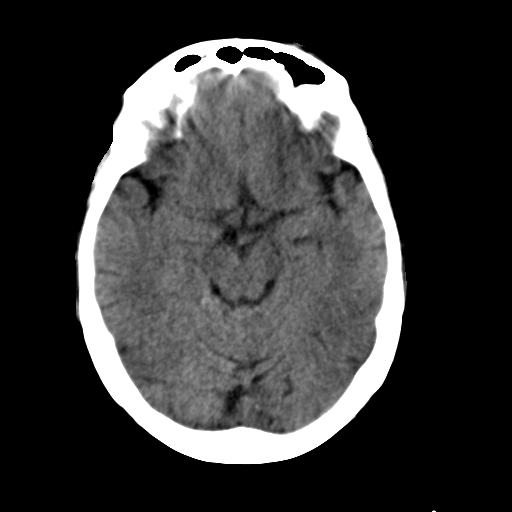
[im 12/32  bone]
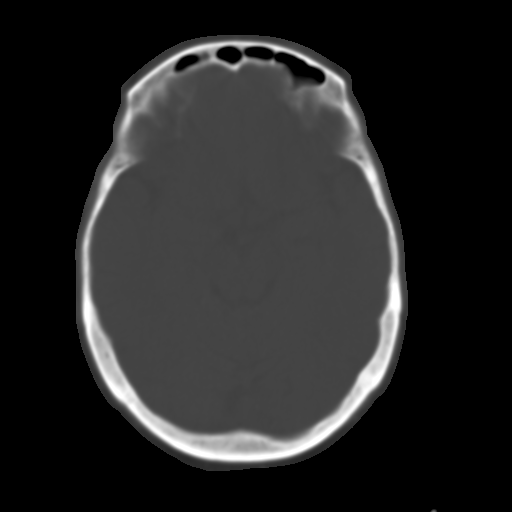
[im 14/32  brain]
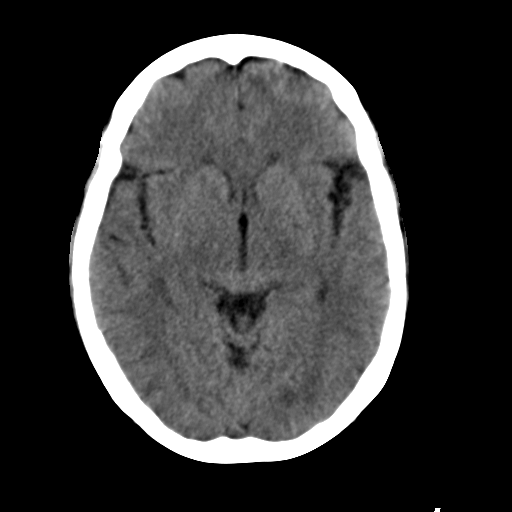
[im 16/32  brain]
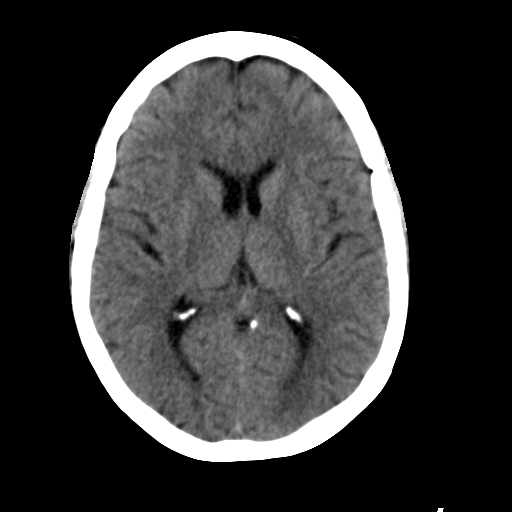
[im 18/32  brain]
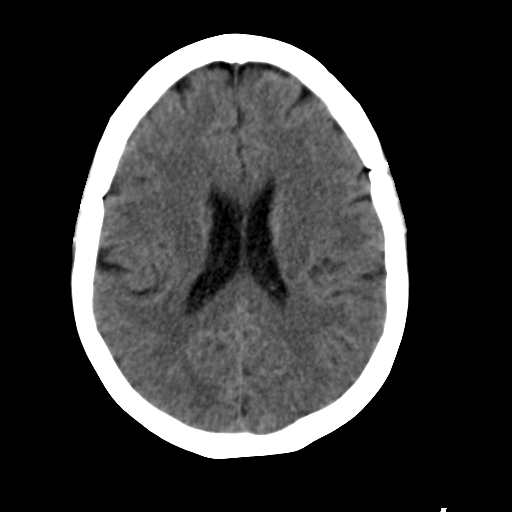
[im 20/32  brain]
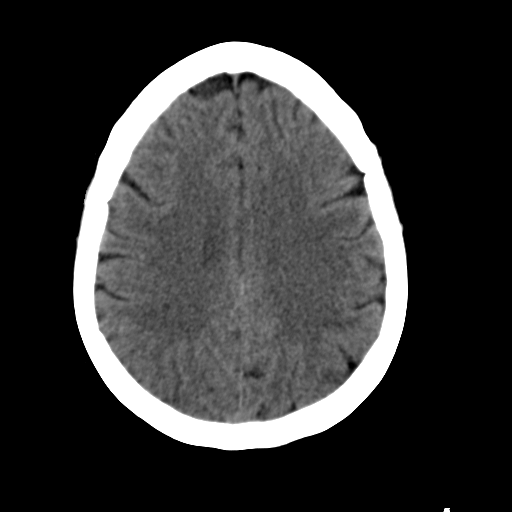
[im 20/32  bone]
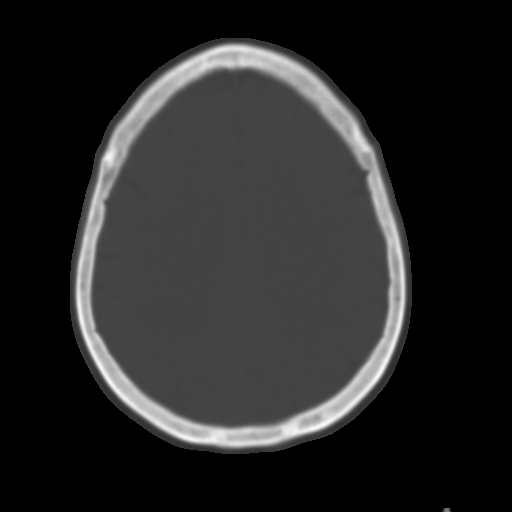
[im 23/32  brain]
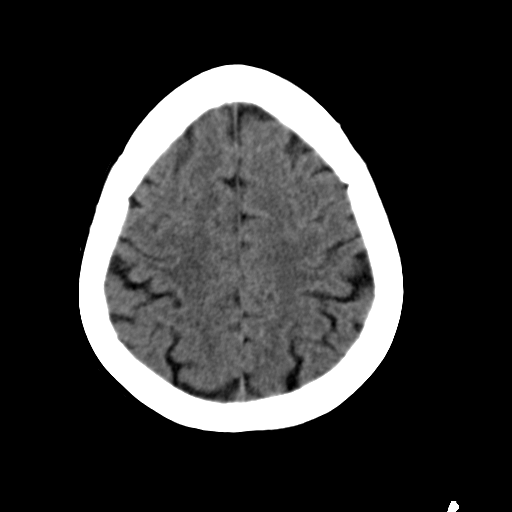
[im 25/32  brain]
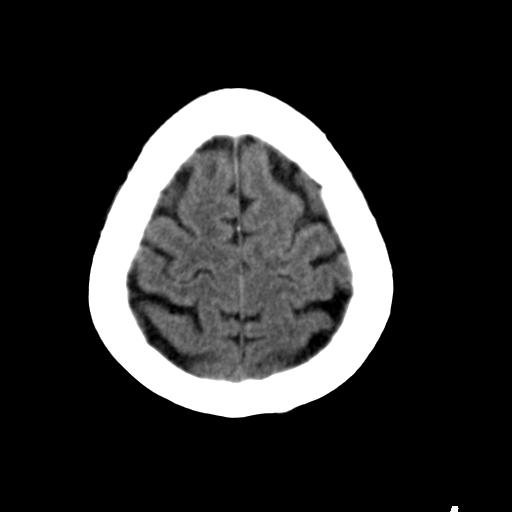
[im 27/32  brain]
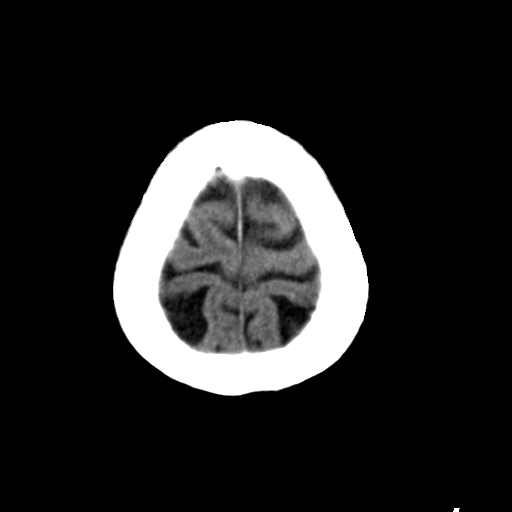
[im 29/32  brain]
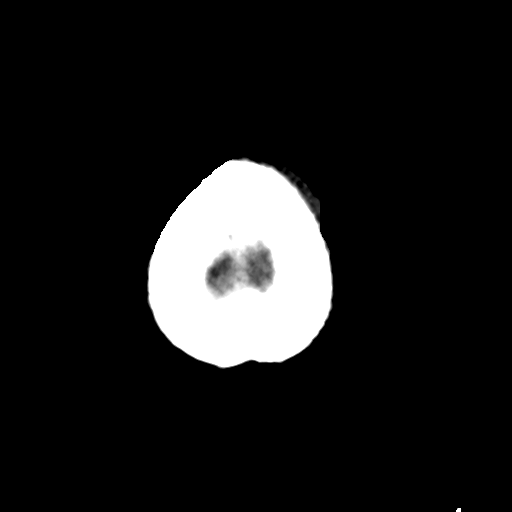
[im 29/32  bone]
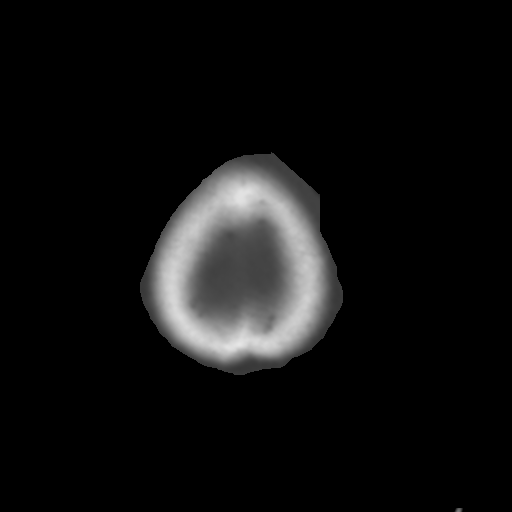

[16 of 30 positions shown; findings below may reference images not displayed]

FINDINGS: There is no intracranial hemorrhage, mass or evidence of acute
infarction. There is mild generalized atrophy. There is white matter
hypodensity consistent with chronic small vessel disease. The no
bony abnormalities evident. Visible paranasal sinuses are clear.
IMPRESSION: Mild atrophy and chronic small vessel changes.

## 2017-10-12 IMAGING — CR DG CHEST 1V PORT
1 series · 1 of 1 positions shown · non-contrast
Comparison: None.

CLINICAL DATA: Seizure.

EXAM:
PORTABLE CHEST 1 VIEW

[ap]
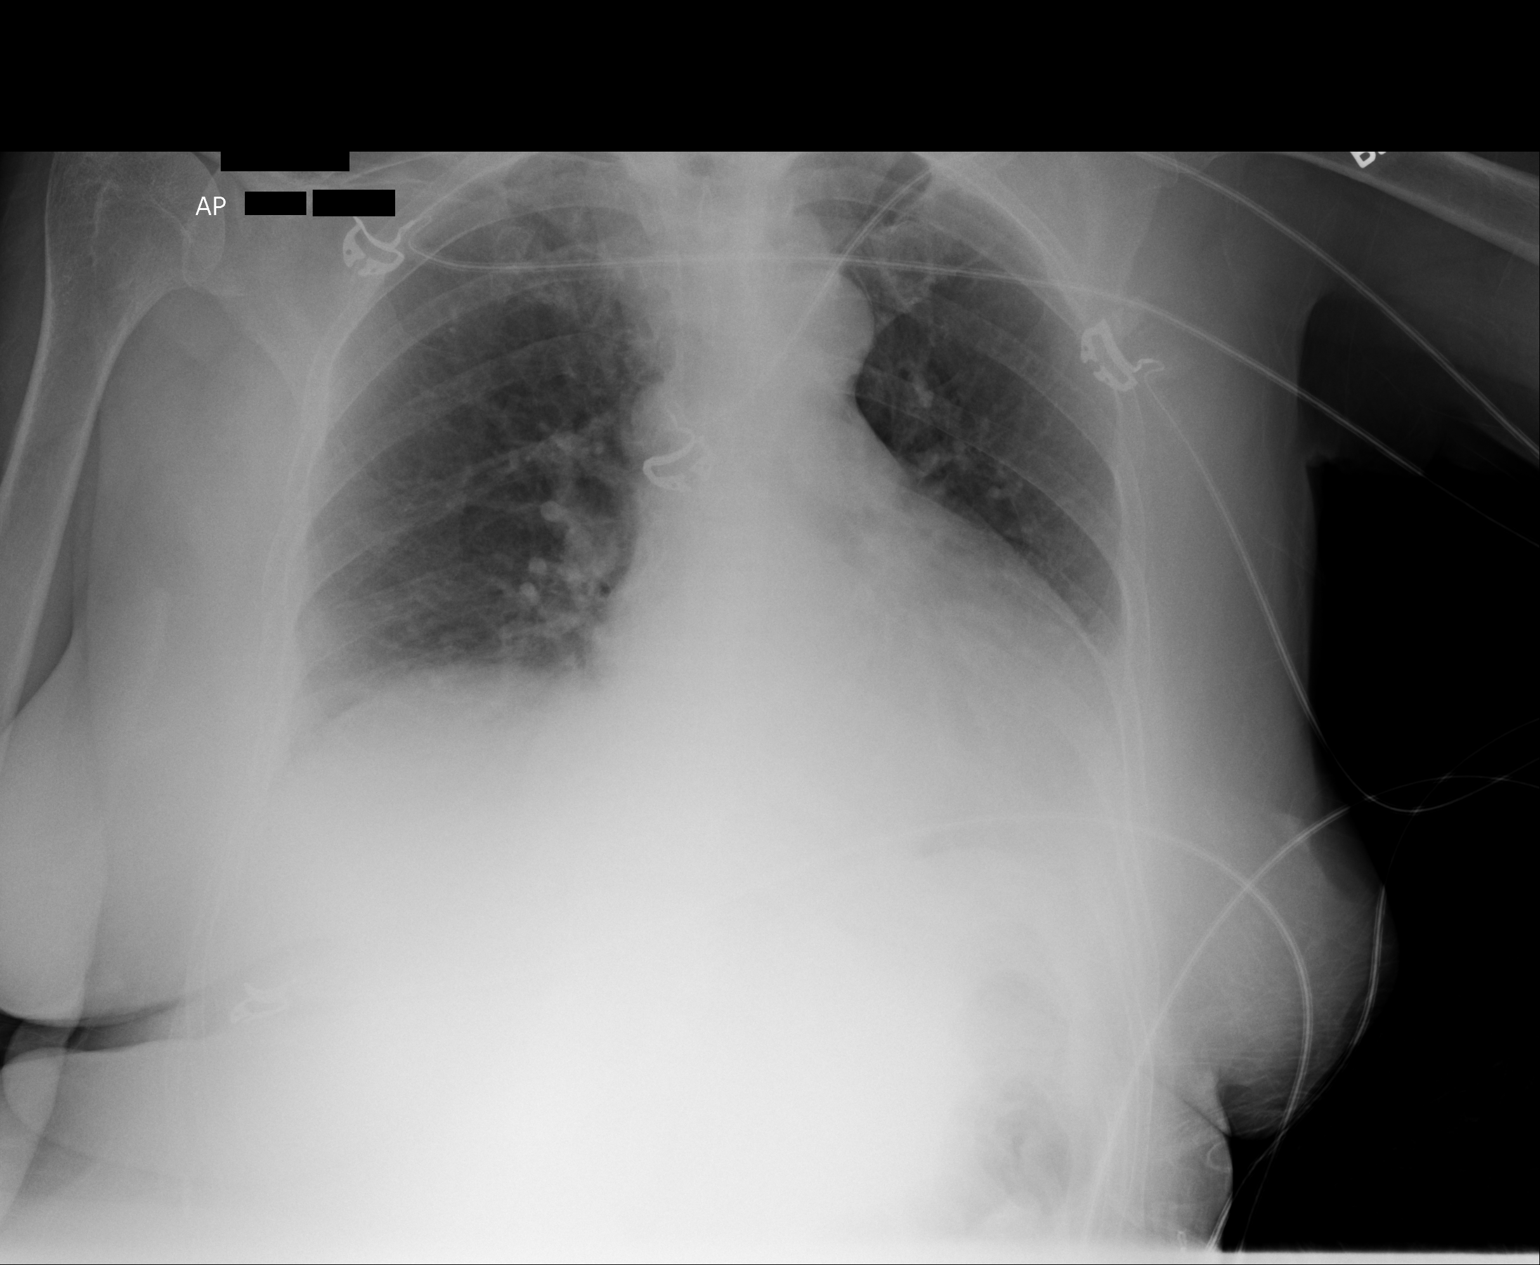

[1 of 1 positions shown; findings below may reference images not displayed]

FINDINGS: A single AP portable view of the chest demonstrates no focal
airspace consolidation or alveolar edema. The lungs are grossly
clear. There is no large effusion or pneumothorax. Cardiac and
mediastinal contours appear unremarkable.
IMPRESSION: No active disease.

## 2017-10-14 ENCOUNTER — Other Ambulatory Visit: Payer: Self-pay | Admitting: General Surgery

## 2017-10-14 ENCOUNTER — Encounter: Payer: Self-pay | Admitting: General Surgery

## 2017-10-14 ENCOUNTER — Ambulatory Visit (INDEPENDENT_AMBULATORY_CARE_PROVIDER_SITE_OTHER): Payer: PPO | Admitting: General Surgery

## 2017-10-14 VITALS — BP 136/74 | HR 85 | Resp 12 | Ht 66.0 in | Wt 180.0 lb

## 2017-10-14 DIAGNOSIS — Z8619 Personal history of other infectious and parasitic diseases: Secondary | ICD-10-CM | POA: Diagnosis not present

## 2017-10-14 DIAGNOSIS — K2901 Acute gastritis with bleeding: Secondary | ICD-10-CM | POA: Diagnosis not present

## 2017-10-14 NOTE — Progress Notes (Signed)
Patient ID: Rachel Cox, female   DOB: 1950/07/05, 67 y.o.   MRN: [id number]  Chief Complaint  Patient presents with  . Follow-up    HPI Rachel Cox is a 67 y.o. female.  Here for one month follow up, post upper and lower endoscopy. No GI issues. Mild indigestion in the morning, she is not taking her Protonix any longer.  HPI  Past Medical History:  Diagnosis Date  . Allergic rhinitis   . Allergy   . Anemia   . Anxiety   . Depression   . Epilepsy ([place]) 2014   managed by Dr. Melrose Nakayama  . History of abnormal cervical Pap smear 1980's  . Hyperglycemia   . Hyperlipidemia   . Hypertension   . Hypothyroidism   . Insomnia   . Obesity (BMI 30.0-34.9) 01/06/2017  . Osteopenia     Past Surgical History:  Procedure Laterality Date  . COLONOSCOPY  Sept 2015  . COLONOSCOPY WITH PROPOFOL N/A 09/10/2017   Procedure: COLONOSCOPY WITH PROPOFOL;  Surgeon: Robert Bellow, MD;  Location: [facility] ENDOSCOPY;  Service: Endoscopy;  Laterality: N/A;  . Cryo Procedure  1980s   for abnormal pap  . ESOPHAGOGASTRODUODENOSCOPY (EGD) WITH PROPOFOL N/A 09/10/2017   Procedure: ESOPHAGOGASTRODUODENOSCOPY (EGD) WITH PROPOFOL;  Surgeon: Robert Bellow, MD;  Location: [facility] ENDOSCOPY;  Service: Endoscopy;  Laterality: N/A;  . TUBAL LIGATION  1969    Family History  Problem Relation Age of Onset  . Hyperlipidemia Mother   . Hypertension Mother   . CAD Mother   . Thyroid disease Mother   . Heart disease Mother   . COPD Father   . Thyroid disease Father   . Cancer Brother        liver  . Alcohol abuse Brother   . Hypertension Brother   . Liver disease Brother   . Thyroid disease Sister   . Diabetes Neg Hx   . Stroke Neg Hx     Social History Social History   Tobacco Use  . Smoking status: Never Smoker  . Smokeless tobacco: Never Used  Substance Use Topics  . Alcohol use: Yes    Comment: occ  . Drug use: No    Allergies  Allergen Reactions  . Aspirin Nausea Only  . Codeine  Palpitations    Pt vocalized    Current Outpatient Medications  Medication Sig Dispense Refill  . Cyanocobalamin (VITAMIN B-12 PO) Take 400 mcg by mouth daily.     . fluticasone (FLONASE) 50 MCG/ACT nasal spray Place 2 sprays into both nostrils daily. 48 g 3  . folic acid (FOLVITE) 885 MCG tablet Take 400 mcg by mouth daily.    Marland Kitchen levETIRAcetam (KEPPRA) 250 MG tablet Take 250 mg by mouth daily.     Marland Kitchen levothyroxine (SYNTHROID, LEVOTHROID) 75 MCG tablet TAKE 1 TABLET BY MOUTH ONCE DAILY BEFORE BREAKFAST 30 tablet 0  . losartan-hydrochlorothiazide (HYZAAR) 100-25 MG tablet Take 1 tablet by mouth daily. 90 tablet 3  . mirtazapine (REMERON) 15 MG tablet 15 mg. Take 1 tablet daily    . Multiple Vitamin (MULTIVITAMIN) tablet Take 1 tablet by mouth daily.    . simvastatin (ZOCOR) 40 MG tablet Take 1 tablet (40 mg total) by mouth at bedtime. 90 tablet 3   No current facility-administered medications for this visit.     Review of Systems Review of Systems  Constitutional: Negative.   Respiratory: Negative.   Cardiovascular: Negative.   Gastrointestinal: Negative for constipation, diarrhea, nausea and vomiting.  Blood pressure 136/74, pulse 85, resp. rate 12, height 5\' 6"  (1.676 m), weight 180 lb (81.6 kg), SpO2 95 %.  Physical Exam Physical Exam  Constitutional: She is oriented to person, place, and time. She appears well-developed and well-nourished.  HENT:  Mouth/Throat: Oropharynx is clear and moist.  Eyes: Conjunctivae are normal. No scleral icterus.  Neck: Neck supple.  Cardiovascular: Normal rate, regular rhythm and normal heart sounds.  Pulmonary/Chest: Effort normal and breath sounds normal.  Neurological: She is alert and oriented to person, place, and time.  Skin: Skin is warm and dry.  Psychiatric: Her behavior is normal.    Data Reviewed  September 10, 2017 biopsy results: A. STOMACH, ANTRUM; COLD BIOPSY:  - HELICOBACTER PYLORI-ASSOCIATED CHRONIC GASTRITIS WITH MILD  CHRONIC  ACTIVE INFLAMMATION.  - NEGATIVE FOR INTESTINAL METAPLASIA, DYSPLASIA, AND MALIGNANCY.  - H. PYLORI BACTERIA IDENTIFIED IN HEMATOXYLIN AND EOSIN SECTIONS.   B. STOMACH, FUNDUS; COLD BIOPSY:  - HELICOBACTER PYLORI-ASSOCIATED CHRONIC GASTRITIS WITH MILD CHRONIC  ACTIVE INFLAMMATION.  - NEGATIVE FOR INTESTINAL METAPLASIA, ATROPHY, DYSPLASIA, AND  MALIGNANCY.  - H. PYLORI BACTERIA IDENTIFIED IN HEMATOXYLIN AND EOSIN SECTIONS.   C. ESOPHAGUS, 25 CM; COLD BIOPSY:  - MARKED EROSIVE ESOPHAGITIS.  - FEW INTRAEPITHELIAL EOSINOPHILS, 2-3 PER HIGH-POWER FIELD.  - NO PILL FRAGMENTS OR VIRAL INCLUSIONS SEEN.  - NEGATIVE FOR DYSPLASIA, AND MALIGNANCY.   Assessment    H. pylori gastritis.    Severe erosive esophagitis    Plan    Recommend taking Protonix twice a day.  Recommend  repeat upper endoscopy with biopsy to confirm resolution of h pylori infection and improvement in severe esophagitis.      HPI, Physical Exam, Assessment and Plan have been scribed under the direction and in the presence of Robert Bellow, MD. [name], RN  I have completed the exam and reviewed the above documentation for accuracy and completeness.  I agree with the above.  Haematologist has been used and any errors in dictation or transcription are unintentional.  [name], M.D., F.A.C.S.   The patient is scheduled for an Upper Endoscopy at [facility] on 10/29/17. She is aware to call the day before to get her arrival time. The patient is aware of date and instructions.  Documented by [name] LPN  [name] 10/14/2017, 9:20 PM

## 2017-10-14 NOTE — Patient Instructions (Addendum)
The patient is aware to call back for any questions or concerns. Recommend taking protonix  Upper Endoscopy Upper endoscopy is a procedure to look inside the upper GI (gastrointestinal) tract. The upper GI tract is made up of:  The tube that carries food and liquid from your throat to your stomach (esophagus).  The stomach.  The first part of your small intestine (duodenum).  This procedure is also called esophagogastroduodenoscopy (EGD) or gastroscopy. In this procedure, your health care provider passes a thin, flexible tube (endoscope) through your mouth and down your esophagus into your stomach. A small camera is attached to the end of the tube. Images from the camera appear on a monitor in the exam room. During this procedure, your health care provider may also remove a small piece of tissue to be sent to a lab and examined under a microscope (biopsy). Your health care provider may do an upper endoscopy to diagnose cancers of the upper GI tract. You may also have this procedure to find the cause of other conditions, such as:  Stomach pain.  Heartburn.  Pain or problems when swallowing.  Nausea and vomiting.  Stomach bleeding.  Stomach ulcers.  Tell a health care provider about:  Any allergies you have.  All medicines you are taking, including vitamins, herbs, eye drops, creams, and over-the-counter medicines.  Any problems you or family members have had with anesthetic medicines.  Any blood disorders you have.  Any surgeries you have had.  Any medical conditions you have.  Whether you are pregnant or may be pregnant. What are the risks? Generally, this is a safe procedure. However, problems may occur, including:  Infection.  Bleeding.  Allergic reactions to medicines.  A tear or hole (perforation) in the esophagus, stomach, or duodenum.  What happens before the procedure?  Follow instructions from your health care provider about eating or drinking  restrictions.  Ask your health care provider about changing or stopping your regular medicines. This is especially important if you are taking diabetes medicines or blood thinners.  Plan to have someone take you home after the procedure.  If you go home right after the procedure, plan to have someone with you for 24 hours. What happens during the procedure?  An IV tube will be inserted into one of your veins.  Your throat may be sprayed with medicine that numbs the area (local anesthetic).  You may be given a medicine to help you relax (sedative).  You will lie on your left side.  Your health care provider will pass the endoscope through your mouth and down your esophagus.  Your provider will use the scope to check the inside of your esophagus, stomach, and duodenum. Biopsies may be taken. The procedure may vary among health care providers and hospitals. What happens after the procedure?  Do not drive for 24 hours if you received a sedative.  Your blood pressure, heart rate, breathing rate, and blood oxygen level will be monitored often until the medicines you were given have worn off.  When your throat is no longer numb, you may be given some fluids to drink.  It is your responsibility to get the results of your procedure. Ask your health care provider or the department performing the procedure when your results will be ready. This information is not intended to replace advice given to you by your health care provider. Make sure you discuss any questions you have with your health care provider. Document Released: 05/17/2000 Document Revised: 10/31/2015 Document  Reviewed: 03/02/2015 Elsevier Interactive Patient Education  [employer].  The patient is scheduled for an Upper Endoscopy at [facility] on 10/29/17. She is aware to call the day before to get her arrival time. The patient is aware of date and instructions.

## 2017-10-29 ENCOUNTER — Ambulatory Visit: Payer: PPO | Admitting: Certified Registered"

## 2017-10-29 ENCOUNTER — Encounter: Payer: Self-pay | Admitting: Certified Registered"

## 2017-10-29 ENCOUNTER — Ambulatory Visit
Admission: RE | Admit: 2017-10-29 | Discharge: 2017-10-29 | Disposition: A | Discharge status: Home / Self Care | Payer: PPO | Source: Ambulatory Visit | Attending: General Surgery | Admitting: General Surgery

## 2017-10-29 ENCOUNTER — Encounter
Admission: RE | Disposition: A | Discharge status: Home / Self Care | Payer: Self-pay | Source: Ambulatory Visit | Attending: General Surgery

## 2017-10-29 ENCOUNTER — Other Ambulatory Visit: Payer: Self-pay

## 2017-10-29 DIAGNOSIS — K2970 Gastritis, unspecified, without bleeding: Secondary | ICD-10-CM

## 2017-10-29 DIAGNOSIS — K449 Diaphragmatic hernia without obstruction or gangrene: Secondary | ICD-10-CM | POA: Insufficient documentation

## 2017-10-29 DIAGNOSIS — E785 Hyperlipidemia, unspecified: Secondary | ICD-10-CM | POA: Diagnosis not present

## 2017-10-29 DIAGNOSIS — F418 Other specified anxiety disorders: Secondary | ICD-10-CM | POA: Diagnosis not present

## 2017-10-29 DIAGNOSIS — Z79899 Other long term (current) drug therapy: Secondary | ICD-10-CM | POA: Diagnosis not present

## 2017-10-29 DIAGNOSIS — I10 Essential (primary) hypertension: Secondary | ICD-10-CM | POA: Diagnosis not present

## 2017-10-29 DIAGNOSIS — K2930 Chronic superficial gastritis without bleeding: Secondary | ICD-10-CM | POA: Insufficient documentation

## 2017-10-29 DIAGNOSIS — F419 Anxiety disorder, unspecified: Secondary | ICD-10-CM | POA: Insufficient documentation

## 2017-10-29 DIAGNOSIS — E039 Hypothyroidism, unspecified: Secondary | ICD-10-CM | POA: Insufficient documentation

## 2017-10-29 DIAGNOSIS — F329 Major depressive disorder, single episode, unspecified: Secondary | ICD-10-CM | POA: Insufficient documentation

## 2017-10-29 DIAGNOSIS — K210 Gastro-esophageal reflux disease with esophagitis: Secondary | ICD-10-CM | POA: Insufficient documentation

## 2017-10-29 DIAGNOSIS — K2950 Unspecified chronic gastritis without bleeding: Secondary | ICD-10-CM | POA: Diagnosis not present

## 2017-10-29 DIAGNOSIS — I129 Hypertensive chronic kidney disease with stage 1 through stage 4 chronic kidney disease, or unspecified chronic kidney disease: Secondary | ICD-10-CM | POA: Diagnosis not present

## 2017-10-29 DIAGNOSIS — D649 Anemia, unspecified: Secondary | ICD-10-CM | POA: Diagnosis not present

## 2017-10-29 DIAGNOSIS — G4700 Insomnia, unspecified: Secondary | ICD-10-CM | POA: Diagnosis not present

## 2017-10-29 DIAGNOSIS — Z8619 Personal history of other infectious and parasitic diseases: Secondary | ICD-10-CM

## 2017-10-29 DIAGNOSIS — N182 Chronic kidney disease, stage 2 (mild): Secondary | ICD-10-CM | POA: Diagnosis not present

## 2017-10-29 HISTORY — PX: ESOPHAGOGASTRODUODENOSCOPY (EGD) WITH PROPOFOL: SHX5813

## 2017-10-29 SURGERY — ESOPHAGOGASTRODUODENOSCOPY (EGD) WITH PROPOFOL
Anesthesia: General

## 2017-10-29 MED ORDER — GLYCOPYRROLATE 0.2 MG/ML IJ SOLN
INTRAMUSCULAR | Status: AC
  Filled 2017-10-29: qty 1

## 2017-10-29 MED ORDER — SODIUM CHLORIDE 0.9 % IV SOLN
INTRAVENOUS | Status: DC | PRN
  Administered 2017-10-29: 10:00:00 via INTRAVENOUS

## 2017-10-29 MED ORDER — PROPOFOL 10 MG/ML IV BOLUS
INTRAVENOUS | Status: AC
  Filled 2017-10-29: qty 20

## 2017-10-29 MED ORDER — SODIUM CHLORIDE 0.9 % IV SOLN
INTRAVENOUS | Status: DC
  Administered 2017-10-29: 09:00:00 via INTRAVENOUS

## 2017-10-29 MED ORDER — PROPOFOL 10 MG/ML IV BOLUS
INTRAVENOUS | Status: DC | PRN
  Administered 2017-10-29 (×3): 50 mg via INTRAVENOUS
  Administered 2017-10-29: 100 mg via INTRAVENOUS
  Administered 2017-10-29: 50 mg via INTRAVENOUS

## 2017-10-29 MED ORDER — LIDOCAINE HCL (CARDIAC) PF 100 MG/5ML IV SOSY
PREFILLED_SYRINGE | INTRAVENOUS | Status: DC | PRN
  Administered 2017-10-29: 50 mg via INTRAVENOUS

## 2017-10-29 MED ORDER — GLYCOPYRROLATE 0.2 MG/ML IJ SOLN
INTRAMUSCULAR | Status: DC | PRN
  Administered 2017-10-29: 0.1 mg via INTRAVENOUS

## 2017-10-29 MED ORDER — LIDOCAINE HCL (PF) 2 % IJ SOLN
INTRAMUSCULAR | Status: AC
  Filled 2017-10-29: qty 10

## 2017-10-29 NOTE — H&P (Signed)
No change in clinical history or exam.  For repeat endoscopy to confirm clearance of H. pylori infection and to reassess severe esophagitis noted at her last exam 6 weeks ago.

## 2017-10-29 NOTE — Op Note (Signed)
[facility] Patient Name: Rachel Cox Procedure Date: 10/29/2017 9:35 AM MRN: [id number] Account #: [id number] Date of Birth: February 22, 1951 Admit Type: Outpatient Age: 67 Room: [facility] ENDO ROOM 1 Gender: Female Note Status: Finalized Procedure:            Upper GI endoscopy Indications:          Suspected esophageal reflux, Helicobacter pylori Providers:            Robert Bellow, MD Referring MD:         Arnetha Courser (Referring MD) Medicines:            Monitored Anesthesia Care Complications:        No immediate complications. Procedure:            Pre-Anesthesia Assessment:                       - Prior to the procedure, a History and Physical was                        performed, and patient medications, allergies and                        sensitivities were reviewed. The patient's tolerance of                        previous anesthesia was reviewed.                       - The risks and benefits of the procedure and the                        sedation options and risks were discussed with the                        patient. All questions were answered and informed                        consent was obtained.                       After obtaining informed consent, the endoscope was                        passed under direct vision. Throughout the procedure,                        the patient's blood pressure, pulse, and oxygen                        saturations were monitored continuously. The Endoscope                        was introduced through the mouth, and advanced to the                        third part of duodenum. The upper GI endoscopy was                        accomplished without difficulty. The patient tolerated  the procedure well. Findings:      The examined duodenum was normal.      LA Grade A (one or more mucosal breaks less than 5 mm, not extending       between tops of 2 mucosal folds)  esophagitis with no bleeding was found       27 to 30 cm from the incisors. Biopsies were taken with a cold forceps       for histology.      A medium-sized hiatal hernia was present.      Diffuse mild inflammation characterized by congestion (edema) was found       in the prepyloric region of the stomach. Biopsies were taken with a cold       forceps for histology. Impression:           - Normal examined duodenum.                       - LA Grade A reflux esophagitis. Biopsied.                       - Medium-sized hiatal hernia.                       - Gastritis. Biopsied. Recommendation:       - Return to endoscopist in 2 weeks. Procedure Code(s):    --- Professional ---                       [phone], Esophagogastroduodenoscopy, flexible, transoral;                        with biopsy, single or multiple Diagnosis Code(s):    --- Professional ---                       K21.0, Gastro-esophageal reflux disease with esophagitis                       K44.9, Diaphragmatic hernia without obstruction or                        gangrene                       K29.70, Gastritis, unspecified, without bleeding                       O75.64, Helicobacter pylori [H. pylori] as the cause of                        diseases classified elsewhere CPT copyright 2017 American Medical Association. All rights reserved. The codes documented in this report are preliminary and upon coder review may  be revised to meet current compliance requirements. Robert Bellow, MD 10/29/2017 10:03:33 AM This report has been signed electronically. Number of Addenda: 0 Note Initiated On: 10/29/2017 9:35 AM      [facility]

## 2017-10-29 NOTE — Anesthesia Postprocedure Evaluation (Signed)
Anesthesia Post Note  Patient: KAYELYNN ABDOU  Procedure(s) Performed: ESOPHAGOGASTRODUODENOSCOPY (EGD) WITH PROPOFOL (N/A )  Patient location during evaluation: Endoscopy Anesthesia Type: General Level of consciousness: awake and alert, oriented and patient cooperative Pain management: satisfactory to patient Vital Signs Assessment: post-procedure vital signs reviewed and stable Respiratory status: spontaneous breathing and respiratory function stable Cardiovascular status: blood pressure returned to baseline and stable Postop Assessment: no headache, no backache, patient able to bend at knees, no apparent nausea or vomiting, adequate PO intake and able to ambulate Anesthetic complications: no     Last Vitals:  Vitals:   10/29/17 0915 10/29/17 1000  BP: (!) 153/95 100/63  Pulse: 81 72  Resp: 16 20  Temp: 36.6 C (!) 36.2 C  SpO2: 98% 93%    Last Pain:  Vitals:   10/29/17 1000  TempSrc: Tympanic  PainSc:                  Clovis Riley Biannca Scantlin

## 2017-10-29 NOTE — Transfer of Care (Signed)
Immediate Anesthesia Transfer of Care Note  Patient: Rachel Cox  Procedure(s) Performed: ESOPHAGOGASTRODUODENOSCOPY (EGD) WITH PROPOFOL (N/A )  Patient Location: Endoscopy Unit  Anesthesia Type:General  Level of Consciousness: awake, alert , oriented and patient cooperative  Airway & Oxygen Therapy: Patient Spontanous Breathing  Post-op Assessment: Report given to RN, Post -op Vital signs reviewed and stable and Patient moving all extremities  Post vital signs: Reviewed and stable  Last Vitals:  Vitals Value Taken Time  BP 100/63 10/29/2017 10:03 AM  Temp 36.2 C 10/29/2017 10:00 AM  Pulse 71 10/29/2017 10:04 AM  Resp 19 10/29/2017 10:04 AM  SpO2 94 % 10/29/2017 10:04 AM  Vitals shown include unvalidated device data.  Last Pain:  Vitals:   10/29/17 1000  TempSrc: Tympanic  PainSc:          Complications: No apparent anesthesia complications

## 2017-10-29 NOTE — Anesthesia Post-op Follow-up Note (Signed)
Anesthesia QCDR form completed.        

## 2017-10-29 NOTE — Anesthesia Preprocedure Evaluation (Signed)
Anesthesia Evaluation  Patient identified by MRN, date of birth, ID band Patient awake    Reviewed: Allergy & Precautions, H&P , NPO status , Patient's Chart, lab work & pertinent test results, reviewed documented beta blocker date and time   Airway Mallampati: II   Neck ROM: full    Dental  (+) Poor Dentition   Pulmonary neg pulmonary ROS,    Pulmonary exam normal        Cardiovascular Exercise Tolerance: Good hypertension, On Medications negative cardio ROS Normal cardiovascular exam Rhythm:regular Rate:Normal     Neuro/Psych Seizures -, Well Controlled,  PSYCHIATRIC DISORDERS Anxiety Depression negative neurological ROS  negative psych ROS   GI/Hepatic negative GI ROS, Neg liver ROS,   Endo/Other  negative endocrine ROSHypothyroidism   Renal/GU Renal diseasenegative Renal ROS  negative genitourinary   Musculoskeletal   Abdominal   Peds  Hematology negative hematology ROS (+) anemia ,   Anesthesia Other Findings Past Medical History: No date: Allergic rhinitis No date: Allergy No date: Anemia No date: Anxiety No date: Depression 2014: Epilepsy ([place])     Comment:  managed by Dr. Melrose Nakayama 1980's: History of abnormal cervical Pap smear No date: Hyperglycemia No date: Hyperlipidemia No date: Hypertension No date: Hypothyroidism No date: Insomnia 01/06/2017: Obesity (BMI 30.0-34.9) No date: Osteopenia Past Surgical History: Sept 2015: COLONOSCOPY 09/10/2017: COLONOSCOPY WITH PROPOFOL; N/A     Comment:  Procedure: COLONOSCOPY WITH PROPOFOL;  Surgeon: Robert Bellow, MD;  Location: [facility];  Service:               Endoscopy;  Laterality: N/A; 1980s: Cryo Procedure     Comment:  for abnormal pap 09/10/2017: ESOPHAGOGASTRODUODENOSCOPY (EGD) WITH PROPOFOL; N/A     Comment:  Procedure: ESOPHAGOGASTRODUODENOSCOPY (EGD) WITH               PROPOFOL;  Surgeon: Robert Bellow, MD;  Location:                [facility];  Service: Endoscopy;  Laterality: N/A; 1969: TUBAL LIGATION BMI    Body Mass Index:  29.05 kg/m     Reproductive/Obstetrics negative OB ROS                             Anesthesia Physical Anesthesia Plan  ASA: III  Anesthesia Plan: General   Post-op Pain Management:    Induction:   PONV Risk Score and Plan:   Airway Management Planned:   Additional Equipment:   Intra-op Plan:   Post-operative Plan:   Informed Consent: I have reviewed the patients History and Physical, chart, labs and discussed the procedure including the risks, benefits and alternatives for the proposed anesthesia with the patient or authorized representative who has indicated his/her understanding and acceptance.   Dental Advisory Given  Plan Discussed with: CRNA  Anesthesia Plan Comments:         Anesthesia Quick Evaluation

## 2017-10-30 ENCOUNTER — Encounter: Payer: Self-pay | Admitting: General Surgery

## 2017-10-31 ENCOUNTER — Encounter: Payer: Self-pay | Admitting: Family Medicine

## 2017-10-31 DIAGNOSIS — K2100 Gastro-esophageal reflux disease with esophagitis, without bleeding: Secondary | ICD-10-CM | POA: Insufficient documentation

## 2017-10-31 HISTORY — DX: Gastro-esophageal reflux disease with esophagitis, without bleeding: K21.00

## 2017-10-31 LAB — SURGICAL PATHOLOGY

## 2017-11-04 ENCOUNTER — Telehealth: Payer: Self-pay | Admitting: *Deleted

## 2017-11-04 NOTE — Telephone Encounter (Signed)
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  This can be scheduled at any time.  She does not require a preoperative visit if it is done in the next couple of months.

## 2017-11-04 NOTE — Telephone Encounter (Signed)
-----   Message from Robert Bellow, MD sent at 11/03/2017 10:04 PM EDT ----- Please notify the patient that the bacterial infection of her stomach has cleared.  She does have changes suggestive of reflux damage to her esophagus, and I would suggest she continues on Protonix once a day.  She should plan for a follow-up endoscopy in 2 years to reassess her esophagus, earlier if she notices any new symptoms.  Please put her in recalls for an EGD.  Thank you ----- Message ----- From: Interface, Lab In Three Zero One Sent: 10/31/2017   5:14 PM To: Robert Bellow, MD

## 2017-11-04 NOTE — Telephone Encounter (Signed)
Notified patient as instructed, patient agrees. Patient wants to know when she can have the lipoma removed on her back, and also its that something she can have done in the office or at [facility]?

## 2017-11-06 ENCOUNTER — Ambulatory Visit (INDEPENDENT_AMBULATORY_CARE_PROVIDER_SITE_OTHER): Payer: PPO | Admitting: Endocrinology

## 2017-11-06 ENCOUNTER — Encounter: Payer: Self-pay | Admitting: Endocrinology

## 2017-11-06 VITALS — BP 142/78 | HR 93 | Wt 183.8 lb

## 2017-11-06 DIAGNOSIS — E039 Hypothyroidism, unspecified: Secondary | ICD-10-CM | POA: Diagnosis not present

## 2017-11-06 LAB — T4, FREE: FREE T4: 1.01 ng/dL (ref 0.60–1.60)

## 2017-11-06 LAB — TSH: TSH: 3.45 u[IU]/mL (ref 0.35–4.50)

## 2017-11-06 NOTE — Patient Instructions (Addendum)
Your blood pressure is high today.  Please see your primary care provider soon, to have it rechecked You seem to need less thyroid medication than you used to.  This may be due to your having 2 different types of thyroid problems--chronic inflammation which causes a low thyroid, and lumps, which can cause a high thyroid.  We'll have to follow your blood tests to tell.   A thyroid blood test is requested for you today.  We'll let you know about the results.  Please come back for a follow-up appointment in 1 year.    

## 2017-11-06 NOTE — Progress Notes (Signed)
Subjective:    Patient ID: Rachel Cox, female    DOB: [birth year], 67 y.o.   MRN: [id number]  HPI Pt returns for f/u of chronic primary hypothyroidism (dx'ed 2008; she has been on prescribed thyroid hormone therapy since then; she was on synthroid 150 mcg/day at one point, but she now requires less; US shows just 1 small nodule).   pt states she feels well in general.  She does not notice the goiter Past Medical History:  Diagnosis Date  . Allergic rhinitis   . Allergy   . Anemia   . Anxiety   . Depression   . Epilepsy ([place]) 2014   managed by Dr. Melrose Nakayama  . History of abnormal cervical Pap smear 1980's  . Hyperglycemia   . Hyperlipidemia   . Hypertension   . Hypothyroidism   . Insomnia   . Obesity (BMI 30.0-34.9) 01/06/2017  . Osteopenia   . Reflux esophagitis 10/31/2017   EGD    Past Surgical History:  Procedure Laterality Date  . COLONOSCOPY  Sept 2015  . COLONOSCOPY WITH PROPOFOL N/A 09/10/2017   Procedure: COLONOSCOPY WITH PROPOFOL;  Surgeon: Robert Bellow, MD;  Location: [facility] END[facility];  Service: Endoscopy;  Laterality: N/A;  . Cryo Procedure  1980s   for abnormal pap  . ESOPHAGOGASTRODUODENOSCOPY (EGD) WITH PROPOFOL N/A 09/10/2017   Procedure: ESOPHAGOGASTRODUODENOSCOPY (EGD) WITH PROPOFOL;  Surgeon: Robert Bellow, MD;  Location: [facility];  Service: Endoscopy;  Laterality: N/A;  . ESOPHAGOGASTRODUODENOSCOPY (EGD) WITH PROPOFOL N/A 10/29/2017   Procedure: ESOPHAGOGASTRODUODENOSCOPY (EGD) WITH PROPOFOL;  Surgeon: Robert Bellow, MD;  Location: [facility];  Service: Endoscopy;  Laterality: N/A;  . TUBAL LIGATION  1969    Social History   Socioeconomic History  . Marital status: Married    Spouse name: Not on file  . Number of children: Not on file  . Years of education: Not on file  . Highest education level: Not on file  Occupational History  . Not on file  Social Needs  . Financial resource strain: Not on file  . Food insecurity:   Worry: Not on file    Inability: Not on file  . Transportation needs:    Medical: Not on file    Non-medical: Not on file  Tobacco Use  . Smoking status: Never Smoker  . Smokeless tobacco: Never Used  Substance and Sexual Activity  . Alcohol use: Yes    Comment: occ  . Drug use: No  . Sexual activity: Yes    Birth control/protection: Post-menopausal  Lifestyle  . Physical activity:    Days per week: Not on file    Minutes per session: Not on file  . Stress: Not on file  Relationships  . Social connections:    Talks on phone: Not on file    Gets together: Not on file    Attends religious service: Not on file    Active member of club or organization: Not on file    Attends meetings of clubs or organizations: Not on file    Relationship status: Not on file  . Intimate partner violence:    Fear of current or ex partner: Not on file    Emotionally abused: Not on file    Physically abused: Not on file    Forced sexual activity: Not on file  Other Topics Concern  . Not on file  Social History Narrative  . Not on file    Current Outpatient Medications on File Prior to Visit  Medication Sig Dispense Refill  . pantoprazole (PROTONIX) 40 MG tablet Take 40 mg by mouth daily.    . Cyanocobalamin (VITAMIN B-12 PO) Take 400 mcg by mouth daily.     . fluticasone (FLONASE) 50 MCG/ACT nasal spray Place 2 sprays into both nostrils daily. 48 g 3  . folic acid (FOLVITE) 741 MCG tablet Take 400 mcg by mouth daily.    Marland Kitchen levETIRAcetam (KEPPRA) 250 MG tablet Take 250 mg by mouth daily.     Marland Kitchen levothyroxine (SYNTHROID, LEVOTHROID) 75 MCG tablet TAKE 1 TABLET BY MOUTH ONCE DAILY BEFORE BREAKFAST 30 tablet 0  . losartan-hydrochlorothiazide (HYZAAR) 100-25 MG tablet Take 1 tablet by mouth daily. 90 tablet 3  . mirtazapine (REMERON) 15 MG tablet 15 mg. Take 1 tablet daily    . Multiple Vitamin (MULTIVITAMIN) tablet Take 1 tablet by mouth daily.    . simvastatin (ZOCOR) 40 MG tablet Take 1 tablet  (40 mg total) by mouth at bedtime. 90 tablet 3   No current facility-administered medications on file prior to visit.     Allergies  Allergen Reactions  . Aspirin Nausea Only  . Codeine Palpitations    Pt vocalized    Family History  Problem Relation Age of Onset  . Hyperlipidemia Mother   . Hypertension Mother   . CAD Mother   . Thyroid disease Mother   . Heart disease Mother   . COPD Father   . Thyroid disease Father   . Cancer Brother        liver  . Alcohol abuse Brother   . Hypertension Brother   . Liver disease Brother   . Thyroid disease Sister   . Diabetes Neg Hx   . Stroke Neg Hx     BP (!) 142/78   Pulse 93   Wt 183 lb 12.8 oz (83.4 kg)   SpO2 95%   BMI 29.67 kg/m    Review of Systems Denies neck swelling.      Objective:   Physical Exam VITAL SIGNS:  See vs page GENERAL: no distress NECK: There is no palpable thyroid enlargement.  No thyroid nodule is palpable.  No palpable lymphadenopathy at the anterior neck.       Assessment & Plan:  HTN: is noted today Multinodular goiter: She declines f/u US.   Hypothyroidism: recheck today  Patient Instructions  Your blood pressure is high today.  Please see your primary care provider soon, to have it rechecked You seem to need less thyroid medication than you used to.  This may be due to your having 2 different types of thyroid problems--chronic inflammation which causes a low thyroid, and lumps, which can cause a high thyroid.  We'll have to follow your blood tests to tell.   A thyroid blood test is requested for you today.  We'll let you know about the results.  Please come back for a follow-up appointment in 1 year.

## 2017-11-07 ENCOUNTER — Ambulatory Visit: Payer: PPO | Admitting: Endocrinology

## 2017-11-08 ENCOUNTER — Other Ambulatory Visit: Payer: Self-pay | Admitting: Endocrinology

## 2017-11-11 NOTE — Telephone Encounter (Signed)
Message left for patient to call back about scheduling surgery.  

## 2017-11-12 ENCOUNTER — Other Ambulatory Visit: Payer: Self-pay | Admitting: General Surgery

## 2017-11-12 NOTE — Telephone Encounter (Signed)
Patient called back and is amendable to surgery at the hospital. The patient is scheduled for surgery at [facility] on 12/12/17. She will pre admit by phone. She will not need to be seen here in the office prior to her surgery. Surgery paperwork has been mailed to the patient. The patient is aware of date and instructions.

## 2017-11-14 ENCOUNTER — Ambulatory Visit (INDEPENDENT_AMBULATORY_CARE_PROVIDER_SITE_OTHER): Payer: PPO

## 2017-11-14 VITALS — BP 124/72 | HR 82 | Temp 97.5°F | Ht 66.0 in | Wt 180.0 lb

## 2017-11-14 DIAGNOSIS — Z0000 Encounter for general adult medical examination without abnormal findings: Secondary | ICD-10-CM | POA: Diagnosis not present

## 2017-11-14 DIAGNOSIS — E2839 Other primary ovarian failure: Secondary | ICD-10-CM

## 2017-11-14 NOTE — Progress Notes (Signed)
Subjective:   Rachel Cox is a 67 y.o. female who presents for Medicare Annual (Subsequent) preventive examination.  Review of Systems:  N/A Cardiac Risk Factors include: advanced age (>58men, >45 women);dyslipidemia;hypertension;sedentary lifestyle     Objective:     Vitals: BP 124/72 (BP Location: Left Arm, Patient Position: Sitting, Cuff Size: Normal)   Pulse 82   Temp (!) 97.5 F (36.4 C) (Oral)   Ht 5\' 6"  (1.676 m)   Wt 180 lb (81.6 kg)   SpO2 93%   BMI 29.05 kg/m   Body mass index is 29.05 kg/m.  Advanced Directives 11/14/2017 10/29/2017 09/10/2017 08/20/2017 07/21/2017 01/06/2017 11/20/2016  Does Patient Have a Medical Advance Directive? No No Yes;No No No No No  Would patient like information on creating a medical advance directive? Yes (MAU/Ambulatory/Procedural Areas - Information given) - No - Patient declined - No - Patient declined - -  Pre-existing out of facility DNR order (yellow form or pink MOST form) - Physician notified to receive inpatient order - - - - -    Tobacco Social History   Tobacco Use  Smoking Status Never Smoker  Smokeless Tobacco Never Used  Tobacco Comment   smoking cessation materials not required     Counseling given: No Comment: smoking cessation materials not required  Clinical Intake:  Pre-visit preparation completed: Yes  Pain : No/denies pain   BMI - recorded: 29.07 Nutritional Status: BMI 25 -29 Overweight Nutritional Risks: None Diabetes: No  How often do you need to have someone help you when you read instructions, pamphlets, or other written materials from your doctor or pharmacy?: 1 - Never  Interpreter Needed?: No  Information entered by :: AEversole, LPN  Past Medical History:  Diagnosis Date  . Allergic rhinitis   . Allergy   . Anemia   . Anxiety   . Depression   . Epilepsy ([place]) 2014   managed by Dr. Melrose Nakayama  . History of abnormal cervical Pap smear 1980's  . Hyperglycemia   . Hyperlipidemia   .  Hypertension   . Hypothyroidism   . Insomnia   . Obesity (BMI 30.0-34.9) 01/06/2017  . Osteopenia   . Reflux esophagitis 10/31/2017   EGD   Past Surgical History:  Procedure Laterality Date  . COLONOSCOPY  Sept 2015  . COLONOSCOPY WITH PROPOFOL N/A 09/10/2017   Procedure: COLONOSCOPY WITH PROPOFOL;  Surgeon: Robert Bellow, MD;  Location: [facility] ENDOSCOPY;  Service: Endoscopy;  Laterality: N/A;  . Cryo Procedure  1980s   for abnormal pap  . ESOPHAGOGASTRODUODENOSCOPY (EGD) WITH PROPOFOL N/A 09/10/2017   Procedure: ESOPHAGOGASTRODUODENOSCOPY (EGD) WITH PROPOFOL;  Surgeon: Robert Bellow, MD;  Location: [facility] ENDOSCOPY;  Service: Endoscopy;  Laterality: N/A;  . ESOPHAGOGASTRODUODENOSCOPY (EGD) WITH PROPOFOL N/A 10/29/2017   Procedure: ESOPHAGOGASTRODUODENOSCOPY (EGD) WITH PROPOFOL;  Surgeon: Robert Bellow, MD;  Location: [facility] ENDOSCOPY;  Service: Endoscopy;  Laterality: N/A;  . TUBAL LIGATION  1969   Family History  Problem Relation Age of Onset  . Hyperlipidemia Mother   . Hypertension Mother   . CAD Mother   . Thyroid disease Mother   . Heart disease Mother   . COPD Father   . Thyroid disease Father   . Cancer Brother        liver  . Alcohol abuse Brother   . Hypertension Brother   . Liver disease Brother   . Thyroid disease Sister   . Diabetes Neg Hx   . Stroke Neg Hx  Social History   Socioeconomic History  . Marital status: Married    Spouse name: Girard Cooter  . Number of children: 4  . Years of education: Not on file  . Highest education level: 10th grade  Occupational History  . Occupation: Retired  Scientific laboratory technician  . Financial resource strain: Not hard at all  . Food insecurity:    Worry: Never true    Inability: Never true  . Transportation needs:    Medical: No    Non-medical: No  Tobacco Use  . Smoking status: Never Smoker  . Smokeless tobacco: Never Used  . Tobacco comment: smoking cessation materials not required  Substance and Sexual Activity    . Alcohol use: Not Currently  . Drug use: No  . Sexual activity: Not Currently    Birth control/protection: Post-menopausal  Lifestyle  . Physical activity:    Days per week: 0 days    Minutes per session: 0 min  . Stress: Not at all  Relationships  . Social connections:    Talks on phone: Patient refused    Gets together: Patient refused    Attends religious service: Patient refused    Active member of club or organization: Patient refused    Attends meetings of clubs or organizations: Patient refused    Relationship status: Married  Other Topics Concern  . Not on file  Social History Narrative  . Not on file    Outpatient Encounter Medications as of 11/14/2017  Medication Sig  . Cyanocobalamin (VITAMIN B-12 PO) Take 400 mcg by mouth daily.   . fluticasone (FLONASE) 50 MCG/ACT nasal spray Place 2 sprays into both nostrils daily.  . folic acid (FOLVITE) 387 MCG tablet Take 400 mcg by mouth daily.  Marland Kitchen levETIRAcetam (KEPPRA) 250 MG tablet Take 250 mg by mouth daily.   Marland Kitchen levothyroxine (SYNTHROID, LEVOTHROID) 75 MCG tablet TAKE 1 TABLET BY MOUTH ONCE DAILY BEFORE BREAKFAST  . losartan-hydrochlorothiazide (HYZAAR) 100-25 MG tablet Take 1 tablet by mouth daily.  . mirtazapine (REMERON) 15 MG tablet 15 mg. Take 1 tablet daily  . Multiple Vitamin (MULTIVITAMIN) tablet Take 1 tablet by mouth daily.  . pantoprazole (PROTONIX) 40 MG tablet Take 40 mg by mouth daily.  . simvastatin (ZOCOR) 40 MG tablet Take 1 tablet (40 mg total) by mouth at bedtime.   No facility-administered encounter medications on file as of 11/14/2017.     Activities of Daily Living In your present state of health, do you have any difficulty performing the following activities: 11/14/2017 07/09/2017  Hearing? N N  Comment denies hearing aids -  Vision? N N  Comment denies eyeglasses -  Difficulty concentrating or making decisions? N N  Walking or climbing stairs? N N  Dressing or bathing? N N  Doing errands,  shopping? N N  Preparing Food and eating ? N -  Comment full set upper and lower dentures -  Using the Toilet? N -  In the past six months, have you accidently leaked urine? N -  Do you have problems with loss of bowel control? N -  Managing your Medications? N -  Managing your Finances? N -  Housekeeping or managing your Housekeeping? N -  Some recent data might be hidden    Patient Care Team: Lada, Satira Anis, MD as PCP - General (Family Medicine) Anabel Bene, MD as Referring Physician (Neurology) Renato Shin, MD as Consulting Physician (Endocrinology) Bary Castilla Forest Gleason, MD as Consulting Physician (General Surgery)    Assessment:  This is a routine wellness examination for Shahida.  Exercise Activities and Dietary recommendations Current Exercise Habits: The patient does not participate in regular exercise at present, Exercise limited by: None identified  Goals    . DIET - INCREASE WATER INTAKE     Recommend to drink at least 6-8 8oz glasses of water per day.       Fall Risk Fall Risk  11/14/2017 07/09/2017 01/06/2017 11/20/2016 07/09/2016  Falls in the past year? No No No No No   FALL RISK PREVENTION PERTAINING TO HOME: Is your home free of loose throw rugs in walkways, pet beds, electrical cords, etc? Yes Is there adequate lighting in your home to reduce risk of falls?  Yes Are there stairs in or around your home WITH handrails? No  ASSISTIVE DEVICES UTILIZED TO PREVENT FALLS: Use of a cane, [place] or w/c? No Grab bars in the bathroom? Yes  Shower chair or a place to sit while bathing? No An elevated toilet seat or a handicapped toilet? No  Timed Get Up and Go Performed: Yes. Pt ambulated 10 feet within 5 sec. Gait stead-fast and without the use of an assistive device. No intervention required at this time. Fall risk prevention has been discussed.  Community Resource Referral:  Pt declined my offer to send [employer] Referral to Care Guide for a shower  chair or an elevated toilet seat.  Depression Screen PHQ 2/9 Scores 11/14/2017 07/09/2017 01/06/2017 11/20/2016  PHQ - 2 Score 0 0 0 0  PHQ- 9 Score 0 - - -     Cognitive Function     6CIT Screen 11/14/2017 11/20/2016  What Year? 0 points 0 points  What month? 0 points 0 points  What time? 0 points 0 points  Count back from 20 0 points 0 points  Months in reverse 0 points 2 points  Repeat phrase 2 points 2 points  Total Score 2 4    Immunization History  Administered Date(s) Administered  . Influenza, High Dose Seasonal PF 03/14/2017  . Influenza,inj,Quad PF,6+ Mos 03/02/2015  . Influenza-Unspecified 04/16/2016  . Pneumococcal Conjugate-13 11/15/2013  . Pneumococcal Polysaccharide-23 07/04/2015  . Td 07/04/1998  . Zoster 06/03/2010    Qualifies for Shingles Vaccine? Yes. Zostavax completed 06/03/10. Due for Shingrix. Education has been provided regarding the importance of this vaccine. Pt has been advised to call her insurance company to determine her out of pocket expense. Advised she may also receive this vaccine at her local pharmacy or Health Dept. Verbalized acceptance and understanding.  Due for Tdap vaccine. Education has been provided regarding the importance of this vaccine. Pt has been advised she may receive this vaccine at her local pharmacy or Health Dept. Also advised to provide a copy of her vaccination record if she chooses to receive this vaccine at her local pharmacy. Verbalized acceptance and understanding.  Screening Tests Health Maintenance  Topic Date Due  . TETANUS/TDAP  07/09/2018 (Originally 07/04/2008)  . INFLUENZA VACCINE  01/01/2018  . DEXA SCAN  01/01/2018  . MAMMOGRAM  07/22/2018  . COLONOSCOPY  09/11/2022  . PNA vac Low Risk Adult  Completed  . Hepatitis C Screening  Addressed    Cancer Screenings: Lung: Low Dose CT Chest recommended if Age 42-80 years, 30 pack-year currently smoking OR have quit w/in 15years. Patient does not qualify. Breast:  Up  to date on Mammogram? Yes. Completed 07/22/17. Repeat every year   Up to date of Bone Density/Dexa? No. Completed 01/02/16. Repeat every 2  years. Ordered today. Pt provided with contact info and advised to schedule appt AFTER 01/01/18. Colorectal: Completed 09/10/17. Repeat every 5 years  Additional Screenings: Hepatitis C Screening: Completed 11/15/13    Plan:  .I have personally reviewed and addressed the Medicare Annual Wellness questionnaire and have noted the following in the patient's chart:  A. Medical and social history B. Use of alcohol, tobacco or illicit drugs  C. Current medications and supplements D. Functional ability and status E.  Nutritional status F.  Physical activity G. Advance directives H. List of other physicians I.  Hospitalizations, surgeries, and ER visits in previous 12 months J.  [place] such as hearing and vision if needed, cognitive and depression L. Referrals and appointments  In addition, I have reviewed and discussed with patient certain preventive protocols, quality metrics, and best practice recommendations. A written personalized care plan for preventive services as well as general preventive health recommendations were provided to patient.  See attached scanned questionnaire for additional information.   Signed,  Aleatha Borer, LPN Nurse Health Advisor

## 2017-11-14 NOTE — Patient Instructions (Signed)
[name] , Thank you for taking time to come for your Medicare Wellness Visit. I appreciate your ongoing commitment to your health goals. Please review the following plan we discussed and let me know if I can assist you in the future.   Screening recommendations/referrals: Colorectal Screening: Up to date Mammogram: Up to date Bone Density: Please call to schedule your appointment after 01/01/18 Lung Cancer Screening: You do not qualify for this screening Hepatitis C Screening: Up to date  Vision and Dental Exams: Recommended annual ophthalmology exams for early detection of glaucoma and other disorders of the eye Recommended annual dental exams for proper oral hygiene  Vaccinations: Influenza vaccine: Up to date Pneumococcal vaccine: Up to date Tdap vaccine: Declined. Please call your insurance company to determine your out of pocket expense. You may also receive this vaccine at your local pharmacy or Health Dept. Shingles vaccine: Please call your insurance company to determine your out of pocket expense for the Shingrix vaccine. You may also receive this vaccine at your local pharmacy or Health Dept.  Advanced directives: Advance directive discussed with you today. I have provided a copy for you to complete at home and have notarized. Once this is complete please bring a copy in to our office so we can scan it into your chart.  Conditions/risks identified: Recommend to drink at least 6-8 8oz glasses of water per day.  Next appointment: Please schedule your Annual Wellness Visit with your Nurse Health Advisor in one year.  Preventive Care 75 Years and Older, Female Preventive care refers to lifestyle choices and visits with your health care provider that can promote health and wellness. What does preventive care include?  A yearly physical exam. This is also called an annual well check.  Dental exams once or twice a year.  Routine eye exams. Ask your health care provider how often  you should have your eyes checked.  Personal lifestyle choices, including:  Daily care of your teeth and gums.  Regular physical activity.  Eating a healthy diet.  Avoiding tobacco and drug use.  Limiting alcohol use.  Practicing safe sex.  Taking low-dose aspirin every day.  Taking vitamin and mineral supplements as recommended by your health care provider. What happens during an annual well check? The services and screenings done by your health care provider during your annual well check will depend on your age, overall health, lifestyle risk factors, and family history of disease. Counseling  Your health care provider may ask you questions about your:  Alcohol use.  Tobacco use.  Drug use.  Emotional well-being.  Home and relationship well-being.  Sexual activity.  Eating habits.  History of falls.  Memory and ability to understand (cognition).  Work and work Statistician.  Reproductive health. Screening  You may have the following tests or measurements:  Height, weight, and BMI.  Blood pressure.  Lipid and cholesterol levels. These may be checked every 5 years, or more frequently if you are over 27 years old.  Skin check.  Lung cancer screening. You may have this screening every year starting at age 76 if you have a 30-pack-year history of smoking and currently smoke or have quit within the past 15 years.  Fecal occult blood test (FOBT) of the stool. You may have this test every year starting at age 83.  Flexible sigmoidoscopy or colonoscopy. You may have a sigmoidoscopy every 5 years or a colonoscopy every 10 years starting at age 21.  Hepatitis C blood test.  Hepatitis  B blood test.  Sexually transmitted disease (STD) testing.  Diabetes screening. This is done by checking your blood sugar (glucose) after you have not eaten for a while (fasting). You may have this done every 1-3 years.  Bone density scan. This is done to screen for  osteoporosis. You may have this done starting at age 14.  Mammogram. This may be done every 1-2 years. Talk to your health care provider about how often you should have regular mammograms. Talk with your health care provider about your test results, treatment options, and if necessary, the need for more tests. Vaccines  Your health care provider may recommend certain vaccines, such as:  Influenza vaccine. This is recommended every year.  Tetanus, diphtheria, and acellular pertussis (Tdap, Td) vaccine. You may need a Td booster every 10 years.  Zoster vaccine. You may need this after age 43.  Pneumococcal 13-valent conjugate (PCV13) vaccine. One dose is recommended after age 76.  Pneumococcal polysaccharide (PPSV23) vaccine. One dose is recommended after age 5. Talk to your health care provider about which screenings and vaccines you need and how often you need them. This information is not intended to replace advice given to you by your health care provider. Make sure you discuss any questions you have with your health care provider. Document Released: 06/16/2015 Document Revised: 02/07/2016 Document Reviewed: 03/21/2015 Elsevier Interactive Patient Education  2017 [place] Prevention in the Home Falls can cause injuries. They can happen to people of all ages. There are many things you can do to make your home safe and to help prevent falls. What can I do on the outside of my home?  Regularly fix the edges of walkways and driveways and fix any cracks.  Remove anything that might make you trip as you walk through a door, such as a raised step or threshold.  Trim any bushes or trees on the path to your home.  Use bright outdoor lighting.  Clear any walking paths of anything that might make someone trip, such as rocks or tools.  Regularly check to see if handrails are loose or broken. Make sure that both sides of any steps have handrails.  Any raised decks and porches  should have guardrails on the edges.  Have any leaves, snow, or ice cleared regularly.  Use sand or salt on walking paths during winter.  Clean up any spills in your garage right away. This includes oil or grease spills. What can I do in the bathroom?  Use night lights.  Install grab bars by the toilet and in the tub and shower. Do not use towel bars as grab bars.  Use non-skid mats or decals in the tub or shower.  If you need to sit down in the shower, use a plastic, non-slip stool.  Keep the floor dry. Clean up any water that spills on the floor as soon as it happens.  Remove soap buildup in the tub or shower regularly.  Attach bath mats securely with double-sided non-slip rug tape.  Do not have throw rugs and other things on the floor that can make you trip. What can I do in the bedroom?  Use night lights.  Make sure that you have a light by your bed that is easy to reach.  Do not use any sheets or blankets that are too big for your bed. They should not hang down onto the floor.  Have a firm chair that has side arms. You can use this for  support while you get dressed.  Do not have throw rugs and other things on the floor that can make you trip. What can I do in the kitchen?  Clean up any spills right away.  Avoid walking on wet floors.  Keep items that you use a lot in easy-to-reach places.  If you need to reach something above you, use a strong step stool that has a grab bar.  Keep electrical cords out of the way.  Do not use floor polish or wax that makes floors slippery. If you must use wax, use non-skid floor wax.  Do not have throw rugs and other things on the floor that can make you trip. What can I do with my stairs?  Do not leave any items on the stairs.  Make sure that there are handrails on both sides of the stairs and use them. Fix handrails that are broken or loose. Make sure that handrails are as long as the stairways.  Check any carpeting to  make sure that it is firmly attached to the stairs. Fix any carpet that is loose or worn.  Avoid having throw rugs at the top or bottom of the stairs. If you do have throw rugs, attach them to the floor with carpet tape.  Make sure that you have a light switch at the top of the stairs and the bottom of the stairs. If you do not have them, ask someone to add them for you. What else can I do to help prevent falls?  Wear shoes that:  Do not have high heels.  Have rubber bottoms.  Are comfortable and fit you well.  Are closed at the toe. Do not wear sandals.  If you use a stepladder:  Make sure that it is fully opened. Do not climb a closed stepladder.  Make sure that both sides of the stepladder are locked into place.  Ask someone to hold it for you, if possible.  Clearly mark and make sure that you can see:  Any grab bars or handrails.  First and last steps.  Where the edge of each step is.  Use tools that help you move around (mobility aids) if they are needed. These include:  Canes.  Walkers.  Scooters.  Crutches.  Turn on the lights when you go into a dark area. Replace any light bulbs as soon as they burn out.  Set up your furniture so you have a clear path. Avoid moving your furniture around.  If any of your floors are uneven, fix them.  If there are any pets around you, be aware of where they are.  Review your medicines with your doctor. Some medicines can make you feel dizzy. This can increase your chance of falling. Ask your doctor what other things that you can do to help prevent falls. This information is not intended to replace advice given to you by your health care provider. Make sure you discuss any questions you have with your health care provider. Document Released: 03/16/2009 Document Revised: 10/26/2015 Document Reviewed: 06/24/2014 Elsevier Interactive Patient Education  2017 [employer].

## 2017-11-24 ENCOUNTER — Encounter: Payer: Self-pay | Admitting: Family Medicine

## 2017-11-24 ENCOUNTER — Ambulatory Visit (INDEPENDENT_AMBULATORY_CARE_PROVIDER_SITE_OTHER): Payer: PPO | Admitting: Family Medicine

## 2017-11-24 VITALS — BP 132/78 | HR 92 | Temp 98.2°F | Resp 14 | Ht 66.0 in | Wt 180.1 lb

## 2017-11-24 DIAGNOSIS — M8580 Other specified disorders of bone density and structure, unspecified site: Secondary | ICD-10-CM

## 2017-11-24 DIAGNOSIS — R739 Hyperglycemia, unspecified: Secondary | ICD-10-CM | POA: Diagnosis not present

## 2017-11-24 DIAGNOSIS — R809 Proteinuria, unspecified: Secondary | ICD-10-CM | POA: Diagnosis not present

## 2017-11-24 DIAGNOSIS — E669 Obesity, unspecified: Secondary | ICD-10-CM | POA: Insufficient documentation

## 2017-11-24 DIAGNOSIS — E039 Hypothyroidism, unspecified: Secondary | ICD-10-CM

## 2017-11-24 DIAGNOSIS — G40802 Other epilepsy, not intractable, without status epilepticus: Secondary | ICD-10-CM | POA: Diagnosis not present

## 2017-11-24 DIAGNOSIS — E663 Overweight: Secondary | ICD-10-CM

## 2017-11-24 DIAGNOSIS — Z5181 Encounter for therapeutic drug level monitoring: Secondary | ICD-10-CM

## 2017-11-24 DIAGNOSIS — E782 Mixed hyperlipidemia: Secondary | ICD-10-CM

## 2017-11-24 DIAGNOSIS — I10 Essential (primary) hypertension: Secondary | ICD-10-CM | POA: Diagnosis not present

## 2017-11-24 DIAGNOSIS — K2100 Gastro-esophageal reflux disease with esophagitis, without bleeding: Secondary | ICD-10-CM

## 2017-11-24 DIAGNOSIS — N183 Chronic kidney disease, stage 3 unspecified: Secondary | ICD-10-CM | POA: Insufficient documentation

## 2017-11-24 DIAGNOSIS — E041 Nontoxic single thyroid nodule: Secondary | ICD-10-CM

## 2017-11-24 NOTE — Assessment & Plan Note (Signed)
Controlled; continue medicine; limit salt and processed foods

## 2017-11-24 NOTE — Assessment & Plan Note (Signed)
Due for DEXA in August; fall precautions encouraged; calcium intake; weight bearing activity encouraged

## 2017-11-24 NOTE — Assessment & Plan Note (Signed)
Check glucose and A1c in August

## 2017-11-24 NOTE — Assessment & Plan Note (Signed)
Managed by Dr. Vira Agar, endo

## 2017-11-24 NOTE — Assessment & Plan Note (Signed)
Managed by neuro and stable

## 2017-11-24 NOTE — Assessment & Plan Note (Addendum)
Check lipids on or after August 7th; avoid saturated fats

## 2017-11-24 NOTE — Assessment & Plan Note (Signed)
Seen by Dr. Bary Castilla; on protonix

## 2017-11-24 NOTE — Assessment & Plan Note (Signed)
Check in August

## 2017-11-24 NOTE — Assessment & Plan Note (Signed)
Avoid NSAIDs; stay hydrated; monitor creatinine in August

## 2017-11-24 NOTE — Patient Instructions (Addendum)
Try to limit or avoid triggers like coffee, caffeinated beverages, onions, chocolate, spicy foods, peppermint, acidic foods like pizza, spaghetti sauce, and orange juice Lose weight if you are overweight or obese Try elevating the head of your bed by placing a small wedge between your mattress and box springs to keep acid in the stomach at night instead of coming up into your esophagus  Check out the information at familydoctor.org entitled "Nutrition for Weight Loss: What You Need to Know about Fad Diets" Try to lose between 1-2 pounds per week by taking in fewer calories and burning off more calories You can succeed by limiting portions, limiting foods dense in calories and fat, becoming more active, and drinking 8 glasses of water a day (64 ounces) Don't skip meals, especially breakfast, as skipping meals may alter your metabolism Do not use over-the-counter weight loss pills or gimmicks that claim rapid weight loss A healthy BMI (or body mass index) is between 18.5 and 24.9 You can calculate your ideal BMI at the [place] website [employer]Mon[employer].fr

## 2017-11-24 NOTE — Progress Notes (Signed)
BP 132/78 (BP Location: Right Arm, Patient Position: Sitting, Cuff Size: Large)   Pulse 92   Temp 98.2 F (36.8 C) (Oral)   Resp 14   Ht 5\' 6"  (1.676 m)   Wt 180 lb 1.6 oz (81.7 kg)   SpO2 98%   BMI 29.07 kg/m    Subjective:    Patient ID: Rachel Cox, female    DOB: [birth year], 67 y.o.   MRN: [id number]  HPI: Rachel Cox is a 67 y.o. female  Chief Complaint  Patient presents with  . Follow-up    HPI She is here f/u; has HTN; checks away from [place]; doing well; not adding much salt to the food; avoiding decongestants  Last seizure was 3-4 years ago; Dr. Melrose Nakayama is her neurologist; no changes in the seizures med  Dr. Bary Castilla gave her Protonix after seeing reflux esophagitis on EGD in May; no blood in the stool  Osteopenia; due in August; eating greens; not too much dairy; taking out the knot on July 12th; then plans to do more walking  Hyperlipidemia; not much bacon or processed foods; has Special K with blueberries and lemon in it Lab Results  Component Value Date   CHOL 167 07/09/2017   HDL 88 07/09/2017   LDLCALC 62 07/09/2017   TRIG 83 07/09/2017   CHOLHDL 1.9 07/09/2017   Hyperglycemia; no dry mouth Lab Results  Component Value Date   HGBA1C 5.2 01/06/2017   CKD; not taking pain medicine OTC; good water drinker  Anemia got straightened and "it's fine" she says  Depression screen [facility] 2/9 11/24/2017 11/14/2017 07/09/2017 01/06/2017 11/20/2016  Decreased Interest 0 0 0 0 0  Down, Depressed, Hopeless 0 0 0 0 0  PHQ - 2 Score 0 0 0 0 0  Altered sleeping 0 0 - - -  Tired, decreased energy 0 0 - - -  Change in appetite 0 0 - - -  Feeling bad or failure about yourself  0 0 - - -  Trouble concentrating 0 0 - - -  Moving slowly or fidgety/restless 0 0 - - -  Suicidal thoughts 0 0 - - -  PHQ-9 Score 0 0 - - -  Difficult doing work/chores Not difficult at all Not difficult at all - - -   Relevant past medical, surgical, family and social history reviewed Past  Medical History:  Diagnosis Date  . Allergic rhinitis   . Allergy   . Anemia   . Anxiety   . Depression   . Epilepsy ([place]) 2014   managed by Dr. Melrose Nakayama  . History of abnormal cervical Pap smear 1980's  . Hyperglycemia   . Hyperlipidemia   . Hypertension   . Hypothyroidism   . Insomnia   . Obesity (BMI 30.0-34.9) 01/06/2017  . Osteopenia   . Reflux esophagitis 10/31/2017   EGD   Past Surgical History:  Procedure Laterality Date  . COLONOSCOPY  Sept 2015  . COLONOSCOPY WITH PROPOFOL N/A 09/10/2017   Procedure: COLONOSCOPY WITH PROPOFOL;  Surgeon: Robert Bellow, MD;  Location: [facility] ENDOSCOPY;  Service: Endoscopy;  Laterality: N/A;  . Cryo Procedure  1980s   for abnormal pap  . ESOPHAGOGASTRODUODENOSCOPY (EGD) WITH PROPOFOL N/A 09/10/2017   Procedure: ESOPHAGOGASTRODUODENOSCOPY (EGD) WITH PROPOFOL;  Surgeon: Robert Bellow, MD;  Location: [facility] ENDOSCOPY;  Service: Endoscopy;  Laterality: N/A;  . ESOPHAGOGASTRODUODENOSCOPY (EGD) WITH PROPOFOL N/A 10/29/2017   Procedure: ESOPHAGOGASTRODUODENOSCOPY (EGD) WITH PROPOFOL;  Surgeon: Robert Bellow, MD;  Location:  [place] [facility];  Service: Endoscopy;  Laterality: N/A;  . TUBAL LIGATION  1969   Family History  Problem Relation Age of Onset  . Hyperlipidemia Mother   . Hypertension Mother   . CAD Mother   . Thyroid disease Mother   . Heart disease Mother   . COPD Father   . Thyroid disease Father   . Cancer Brother        liver  . Alcohol abuse Brother   . Hypertension Brother   . Liver disease Brother   . Thyroid disease Sister   . Diabetes Neg Hx   . Stroke Neg Hx    Social History   Tobacco Use  . Smoking status: Never Smoker  . Smokeless tobacco: Never Used  . Tobacco comment: smoking cessation materials not required  Substance Use Topics  . Alcohol use: Not Currently  . Drug use: No    Interim medical history since last visit reviewed. Allergies and medications reviewed  Review of Systems Per HPI unless  specifically indicated above     Objective:    BP 132/78 (BP Location: Right Arm, Patient Position: Sitting, Cuff Size: Large)   Pulse 92   Temp 98.2 F (36.8 C) (Oral)   Resp 14   Ht 5\' 6"  (1.676 m)   Wt 180 lb 1.6 oz (81.7 kg)   SpO2 98%   BMI 29.07 kg/m   Wt Readings from Last 3 Encounters:  11/24/17 180 lb 1.6 oz (81.7 kg)  11/14/17 180 lb (81.6 kg)  11/06/17 183 lb 12.8 oz (83.4 kg)    Physical Exam  Constitutional: She appears well-developed and well-nourished. No distress.  HENT:  Head: Normocephalic and atraumatic.  Eyes: EOM are normal. No scleral icterus.  Neck: No thyromegaly present.  Cardiovascular: Normal rate, regular rhythm and normal heart sounds.  No murmur heard. Pulmonary/Chest: Effort normal and breath sounds normal. No respiratory distress. She has no wheezes.  Abdominal: Soft. Bowel sounds are normal. She exhibits no distension.  Musculoskeletal: She exhibits no edema.       Thoracic back: She exhibits deformity (thoracic kyphosis).  Neurological: She is alert. She exhibits normal muscle tone.  Skin: Skin is warm and dry. She is not diaphoretic. No pallor.  Psychiatric: She has a normal mood and affect. Her behavior is normal. Judgment and thought content normal.    Results for orders placed or performed in visit on 11/06/17  T4, free  Result Value Ref Range   Free T4 1.01 0.60 - 1.60 ng/dL  TSH  Result Value Ref Range   TSH 3.45 0.35 - 4.50 uIU/mL      Assessment & Plan:   Problem List Items Addressed This Visit      Cardiovascular and Mediastinum   Hypertension - Primary (Chronic)    Controlled; continue medicine; limit salt and processed foods        Digestive   Reflux esophagitis    Seen by Dr. Bary Castilla; on protonix        Endocrine   Thyroid nodule    Stable, managed by specialist      Hypothyroidism (Chronic)    Managed by Dr. Vira Agar, endo        Nervous and Auditory   Epilepsy [facility])    Managed by neuro and  stable        Musculoskeletal and Integument   Osteopenia (Chronic)    Due for DEXA in August; fall precautions encouraged; calcium intake; weight bearing activity encouraged  Genitourinary   CKD (chronic kidney disease) stage 3, GFR 30-59 ml/min (HCC) (Chronic)    Avoid NSAIDs; stay hydrated; monitor creatinine in August        Other   Medication monitoring encounter   Relevant Orders   COMPLETE METABOLIC PANEL WITH GFR   Overweight (BMI 25.0-29.9) (Chronic)    Praise given for weight loss; goal over next 6 months is ten pounds down or more      Microalbuminuria    Check in August      Relevant Orders   Microalbumin / creatinine urine ratio   Hyperlipidemia (Chronic)    Check lipids on or after August 7th; avoid saturated fats      Relevant Orders   Lipid panel   Hyperglycemia    Check glucose and A1c in August      Relevant Orders   Hemoglobin A1c       Follow up plan: Return in about 6 months (around 05/26/2018) for follow-up visit with Dr. Sanda Klein.  An after-visit summary was printed and given to the patient at [place].  Please see the patient instructions which may contain other information and recommendations beyond what is mentioned above in the assessment and plan.  No orders of the defined types were placed in this encounter.   Orders Placed This Encounter  Procedures  . COMPLETE METABOLIC PANEL WITH GFR  . Hemoglobin A1c  . Lipid panel  . Microalbumin / creatinine urine ratio

## 2017-11-24 NOTE — Assessment & Plan Note (Signed)
Stable, managed by specialist

## 2017-11-24 NOTE — Assessment & Plan Note (Signed)
Praise given for weight loss; goal over next 6 months is ten pounds down or more

## 2017-12-05 ENCOUNTER — Encounter
Admission: RE | Admit: 2017-12-05 | Discharge: 2017-12-05 | Disposition: A | Discharge status: Home / Self Care | Payer: PPO | Source: Ambulatory Visit | Attending: General Surgery | Admitting: General Surgery

## 2017-12-05 ENCOUNTER — Other Ambulatory Visit: Payer: Self-pay

## 2017-12-05 HISTORY — DX: Gastro-esophageal reflux disease without esophagitis: K21.9

## 2017-12-05 NOTE — Patient Instructions (Addendum)
Your procedure is scheduled on: 12-12-17 FRIDAY Report to Same Day Surgery 2nd floor medical mall [facility] Entrance-take elevator on left to 2nd floor.  Check in with surgery information desk.) To find out your arrival time please call [phone] between 1PM - 3PM on 12-11-17 THURSDAY  Remember: Instructions that are not followed completely may result in serious medical risk, up to and including death, or upon the discretion of your surgeon and anesthesiologist your surgery may need to be rescheduled.    _x___ 1. Do not eat food after midnight the night before your procedure. NO GUM OR CANDY AFTER MIDNIGHT.  You may drink clear liquids up to 2 hours before you are scheduled to arrive at the hospital for your procedure.  Do not drink clear liquids within 2 hours of your scheduled arrival to the hospital.  Clear liquids include  --Water or Apple juice without pulp  --Clear carbohydrate beverage such as ClearFast or Gatorade  --Black Coffee or Clear Tea (No milk, no creamers, do not add anything to the coffee or Tea     __x__ 2. No Alcohol for 24 hours before or after surgery.   __x__3. No Smoking or e-cigarettes for 24 prior to surgery.  Do not use any chewable tobacco products for at least 6 hour prior to surgery   ____  4. Bring all medications with you on the day of surgery if instructed.    __x__ 5. Notify your doctor if there is any change in your medical condition     (cold, fever, infections).    x___6. On the morning of surgery brush your teeth with toothpaste and water.  You may rinse your mouth with mouth wash if you wish.  Do not swallow any toothpaste or mouthwash.   Do not wear jewelry, make-up, hairpins, clips or nail polish.  Do not wear lotions, powders, or perfumes. You may wear deodorant.  Do not shave 48 hours prior to surgery. Men may shave face and neck.  Do not bring valuables to the hospital.    [facility] is not responsible for any belongings or  valuables.               Contacts, dentures or bridgework may not be worn into surgery.  Leave your suitcase in the car. After surgery it may be brought to your room.  For patients admitted to the hospital, discharge time is determined by your treatment team.  _  Patients discharged the day of surgery will not be allowed to drive home.  You will need someone to drive you home and stay with you the night of your procedure.    Please read over the following fact sheets that you were given:   [facility] Preparing for Surgery   _x___ TAKE THE FOLLOWING MEDICATION THE MORNING OF SURGERY WITH A SMALL SIP OF WATER. These include:  1. LEVOTHYROXINE  2. PROTONIX  3. REMERON  4.  5.  6.  ____Fleets enema or Magnesium Citrate as directed.   _x___ Use CHG Soap or sage wipes as directed on instruction sheet   ____ Use inhalers on the day of surgery and bring to hospital day of surgery  ____ Stop Metformin and Janumet 2 days prior to surgery.    ____ Take 1/2 of usual insulin dose the night before surgery and none on the morning surgery.   ____ Follow recommendations from Cardiologist, Pulmonologist or PCP regarding stopping Aspirin, Coumadin, Plavix ,Eliquis, Effient, or Pradaxa, and Pletal.  X____Stop Anti-inflammatories such as Advil, Aleve, Ibuprofen, Motrin, Naproxen, Naprosyn, Goodies powders or aspirin products.OK to take Tylenol    ____ Stop supplements until after surgery.    ____ Bring C-Pap to the hospital.

## 2017-12-09 ENCOUNTER — Encounter
Admission: RE | Admit: 2017-12-09 | Discharge: 2017-12-09 | Disposition: A | Discharge status: Home / Self Care | Payer: PPO | Source: Ambulatory Visit | Attending: General Surgery | Admitting: General Surgery

## 2017-12-09 DIAGNOSIS — Z01812 Encounter for preprocedural laboratory examination: Secondary | ICD-10-CM | POA: Insufficient documentation

## 2017-12-09 DIAGNOSIS — D170 Benign lipomatous neoplasm of skin and subcutaneous tissue of head, face and neck: Secondary | ICD-10-CM | POA: Diagnosis not present

## 2017-12-09 DIAGNOSIS — E785 Hyperlipidemia, unspecified: Secondary | ICD-10-CM | POA: Diagnosis not present

## 2017-12-09 DIAGNOSIS — Z01810 Encounter for preprocedural cardiovascular examination: Secondary | ICD-10-CM | POA: Insufficient documentation

## 2017-12-09 DIAGNOSIS — E039 Hypothyroidism, unspecified: Secondary | ICD-10-CM | POA: Diagnosis not present

## 2017-12-09 DIAGNOSIS — Z79899 Other long term (current) drug therapy: Secondary | ICD-10-CM | POA: Diagnosis not present

## 2017-12-09 DIAGNOSIS — I10 Essential (primary) hypertension: Secondary | ICD-10-CM

## 2017-12-09 DIAGNOSIS — F329 Major depressive disorder, single episode, unspecified: Secondary | ICD-10-CM | POA: Diagnosis not present

## 2017-12-09 DIAGNOSIS — K219 Gastro-esophageal reflux disease without esophagitis: Secondary | ICD-10-CM | POA: Diagnosis not present

## 2017-12-09 DIAGNOSIS — F419 Anxiety disorder, unspecified: Secondary | ICD-10-CM | POA: Diagnosis not present

## 2017-12-09 DIAGNOSIS — G40909 Epilepsy, unspecified, not intractable, without status epilepticus: Secondary | ICD-10-CM | POA: Diagnosis not present

## 2017-12-09 LAB — BASIC METABOLIC PANEL
Anion gap: 8 (ref 5–15)
BUN: 16 mg/dL (ref 8–23)
CHLORIDE: 102 mmol/L (ref 98–111)
CO2: 27 mmol/L (ref 22–32)
CREATININE: 0.79 mg/dL (ref 0.44–1.00)
Calcium: 9.4 mg/dL (ref 8.9–10.3)
GFR calc Af Amer: >60 mL/min (ref >60)
GFR calc non Af Amer: >60 mL/min (ref >60)
GLUCOSE: 92 mg/dL (ref 70–99)
Potassium: 3.3 mmol/L — ABNORMAL LOW (ref 3.5–5.1)
Sodium: 137 mmol/L (ref 135–145)

## 2017-12-10 ENCOUNTER — Other Ambulatory Visit: Payer: Self-pay | Admitting: General Surgery

## 2017-12-10 ENCOUNTER — Other Ambulatory Visit: Payer: Self-pay

## 2017-12-10 ENCOUNTER — Telehealth: Payer: Self-pay

## 2017-12-10 MED ORDER — POTASSIUM CHLORIDE ER 20 MEQ PO TBCR: EXTENDED_RELEASE_TABLET | ORAL | 0 refills | Status: DC

## 2017-12-10 NOTE — Telephone Encounter (Signed)
Notified patient and she will pick up an start prescription today.

## 2017-12-10 NOTE — Telephone Encounter (Signed)
-----   Message from Robert Bellow, MD sent at 12/10/2017  3:19 PM EDT ----- Please arrange for the patient to get KCL, 20 mEq talbs, # 6, to take three times a day until surgery. One tonight, three tomorrow, one AM of procedure 7/12.  Thanks.  ----- Message ----- From: Buel Ream, Lab [facility] [place] Sent: 12/09/2017   3:42 PM To: Robert Bellow, MD

## 2017-12-12 ENCOUNTER — Encounter
Admission: RE | Disposition: A | Discharge status: Home / Self Care | Payer: Self-pay | Source: Ambulatory Visit | Attending: General Surgery

## 2017-12-12 ENCOUNTER — Ambulatory Visit
Admission: RE | Admit: 2017-12-12 | Discharge: 2017-12-12 | Disposition: A | Discharge status: Home / Self Care | Payer: PPO | Source: Ambulatory Visit | Attending: General Surgery | Admitting: General Surgery

## 2017-12-12 ENCOUNTER — Encounter: Payer: Self-pay | Admitting: Certified Registered"

## 2017-12-12 ENCOUNTER — Ambulatory Visit: Payer: PPO | Admitting: Certified Registered"

## 2017-12-12 DIAGNOSIS — E039 Hypothyroidism, unspecified: Secondary | ICD-10-CM | POA: Insufficient documentation

## 2017-12-12 DIAGNOSIS — N183 Chronic kidney disease, stage 3 (moderate): Secondary | ICD-10-CM | POA: Diagnosis not present

## 2017-12-12 DIAGNOSIS — F418 Other specified anxiety disorders: Secondary | ICD-10-CM | POA: Diagnosis not present

## 2017-12-12 DIAGNOSIS — F419 Anxiety disorder, unspecified: Secondary | ICD-10-CM | POA: Insufficient documentation

## 2017-12-12 DIAGNOSIS — K219 Gastro-esophageal reflux disease without esophagitis: Secondary | ICD-10-CM | POA: Insufficient documentation

## 2017-12-12 DIAGNOSIS — D171 Benign lipomatous neoplasm of skin and subcutaneous tissue of trunk: Secondary | ICD-10-CM | POA: Diagnosis not present

## 2017-12-12 DIAGNOSIS — I129 Hypertensive chronic kidney disease with stage 1 through stage 4 chronic kidney disease, or unspecified chronic kidney disease: Secondary | ICD-10-CM | POA: Diagnosis not present

## 2017-12-12 DIAGNOSIS — G40909 Epilepsy, unspecified, not intractable, without status epilepticus: Secondary | ICD-10-CM | POA: Insufficient documentation

## 2017-12-12 DIAGNOSIS — D210 Benign neoplasm of connective and other soft tissue of head, face and neck: Secondary | ICD-10-CM | POA: Diagnosis not present

## 2017-12-12 DIAGNOSIS — D170 Benign lipomatous neoplasm of skin and subcutaneous tissue of head, face and neck: Secondary | ICD-10-CM | POA: Insufficient documentation

## 2017-12-12 DIAGNOSIS — Z79899 Other long term (current) drug therapy: Secondary | ICD-10-CM | POA: Insufficient documentation

## 2017-12-12 DIAGNOSIS — I10 Essential (primary) hypertension: Secondary | ICD-10-CM | POA: Insufficient documentation

## 2017-12-12 DIAGNOSIS — E785 Hyperlipidemia, unspecified: Secondary | ICD-10-CM | POA: Diagnosis not present

## 2017-12-12 DIAGNOSIS — F329 Major depressive disorder, single episode, unspecified: Secondary | ICD-10-CM | POA: Insufficient documentation

## 2017-12-12 DIAGNOSIS — K210 Gastro-esophageal reflux disease with esophagitis: Secondary | ICD-10-CM | POA: Diagnosis not present

## 2017-12-12 HISTORY — PX: LIPOMA EXCISION: SHX5283

## 2017-12-12 LAB — POCT I-STAT 4, (NA,K, GLUC, HGB,HCT)
Glucose, Bld: 97 mg/dL (ref 70–99)
HEMATOCRIT: 43 % (ref 36.0–46.0)
Hemoglobin: 14.6 g/dL (ref 12.0–15.0)
Potassium: 3.5 mmol/L (ref 3.5–5.1)
Sodium: 141 mmol/L (ref 135–145)

## 2017-12-12 SURGERY — EXCISION LIPOMA
Anesthesia: General | Wound class: Clean

## 2017-12-12 MED ORDER — MIDAZOLAM HCL 2 MG/2ML IJ SOLN
INTRAMUSCULAR | Status: AC
  Filled 2017-12-12: qty 2

## 2017-12-12 MED ORDER — PROPOFOL 10 MG/ML IV BOLUS
INTRAVENOUS | Status: AC
  Filled 2017-12-12: qty 20

## 2017-12-12 MED ORDER — TRAMADOL HCL 50 MG PO TABS: 50.0000 mg | ORAL_TABLET | ORAL | 0 refills | Status: DC | PRN

## 2017-12-12 MED ORDER — MIDAZOLAM HCL 2 MG/2ML IJ SOLN
INTRAMUSCULAR | Status: DC | PRN
  Administered 2017-12-12: 2 mg via INTRAVENOUS

## 2017-12-12 MED ORDER — LIDOCAINE HCL (PF) 2 % IJ SOLN
INTRAMUSCULAR | Status: AC
  Filled 2017-12-12: qty 10

## 2017-12-12 MED ORDER — DEXAMETHASONE SODIUM PHOSPHATE 10 MG/ML IJ SOLN
INTRAMUSCULAR | Status: DC | PRN
  Administered 2017-12-12: 10 mg via INTRAVENOUS

## 2017-12-12 MED ORDER — GLYCOPYRROLATE 0.2 MG/ML IJ SOLN
INTRAMUSCULAR | Status: AC
  Filled 2017-12-12: qty 1

## 2017-12-12 MED ORDER — KETAMINE HCL 50 MG/ML IJ SOLN
INTRAMUSCULAR | Status: AC
  Filled 2017-12-12: qty 10

## 2017-12-12 MED ORDER — GLYCOPYRROLATE 0.2 MG/ML IJ SOLN
INTRAMUSCULAR | Status: DC | PRN
  Administered 2017-12-12 (×2): 0.1 mg via INTRAVENOUS

## 2017-12-12 MED ORDER — ROCURONIUM BROMIDE 100 MG/10ML IV SOLN
INTRAVENOUS | Status: DC | PRN
  Administered 2017-12-12: 30 mg via INTRAVENOUS

## 2017-12-12 MED ORDER — FENTANYL CITRATE (PF) 100 MCG/2ML IJ SOLN
INTRAMUSCULAR | Status: AC
  Filled 2017-12-12: qty 2

## 2017-12-12 MED ORDER — FENTANYL CITRATE (PF) 100 MCG/2ML IJ SOLN
INTRAMUSCULAR | Status: DC | PRN
  Administered 2017-12-12 (×2): 100 ug via INTRAVENOUS

## 2017-12-12 MED ORDER — PHENYLEPHRINE HCL 10 MG/ML IJ SOLN
INTRAMUSCULAR | Status: DC | PRN
  Administered 2017-12-12 (×2): 200 ug via INTRAVENOUS
  Administered 2017-12-12: 100 ug via INTRAVENOUS

## 2017-12-12 MED ORDER — FENTANYL CITRATE (PF) 100 MCG/2ML IJ SOLN: 25.0000 ug | INTRAMUSCULAR | Status: DC | PRN

## 2017-12-12 MED ORDER — LIDOCAINE HCL (CARDIAC) PF 100 MG/5ML IV SOSY
PREFILLED_SYRINGE | INTRAVENOUS | Status: DC | PRN
  Administered 2017-12-12: 80 mg via INTRAVENOUS

## 2017-12-12 MED ORDER — DEXAMETHASONE SODIUM PHOSPHATE 10 MG/ML IJ SOLN
INTRAMUSCULAR | Status: AC
  Filled 2017-12-12: qty 1

## 2017-12-12 MED ORDER — KETAMINE HCL 10 MG/ML IJ SOLN
INTRAMUSCULAR | Status: DC | PRN
  Administered 2017-12-12: 20 mg via INTRAVENOUS
  Administered 2017-12-12: 30 mg via INTRAVENOUS

## 2017-12-12 MED ORDER — SUGAMMADEX SODIUM 200 MG/2ML IV SOLN
INTRAVENOUS | Status: DC | PRN
  Administered 2017-12-12: 200 mg via INTRAVENOUS

## 2017-12-12 MED ORDER — ACETAMINOPHEN 10 MG/ML IV SOLN
INTRAVENOUS | Status: DC | PRN
  Administered 2017-12-12: 1000 mg via INTRAVENOUS

## 2017-12-12 MED ORDER — ACETAMINOPHEN 10 MG/ML IV SOLN
INTRAVENOUS | Status: AC
  Filled 2017-12-12: qty 100

## 2017-12-12 MED ORDER — ONDANSETRON HCL 4 MG/2ML IJ SOLN
INTRAMUSCULAR | Status: DC | PRN
  Administered 2017-12-12: 4 mg via INTRAVENOUS

## 2017-12-12 MED ORDER — LACTATED RINGERS IV SOLN
INTRAVENOUS | Status: DC
  Administered 2017-12-12 (×2): via INTRAVENOUS

## 2017-12-12 MED ORDER — ROCURONIUM BROMIDE 50 MG/5ML IV SOLN
INTRAVENOUS | Status: AC
  Filled 2017-12-12: qty 1

## 2017-12-12 MED ORDER — SUGAMMADEX SODIUM 200 MG/2ML IV SOLN
INTRAVENOUS | Status: AC
  Filled 2017-12-12: qty 2

## 2017-12-12 MED ORDER — PROPOFOL 10 MG/ML IV BOLUS
INTRAVENOUS | Status: DC | PRN
  Administered 2017-12-12: 150 mg via INTRAVENOUS
  Administered 2017-12-12: 20 mg via INTRAVENOUS
  Administered 2017-12-12: 30 mg via INTRAVENOUS

## 2017-12-12 MED ORDER — ONDANSETRON HCL 4 MG/2ML IJ SOLN: 4.0000 mg | Freq: Once | INTRAMUSCULAR | Status: DC | PRN

## 2017-12-12 MED ORDER — ONDANSETRON HCL 4 MG/2ML IJ SOLN
INTRAMUSCULAR | Status: AC
  Filled 2017-12-12: qty 2

## 2017-12-12 SURGICAL SUPPLY — 34 items
BNDG GAUZE 4.5X4.1 6PLY STRL (MISCELLANEOUS) ×3 IMPLANT
CANISTER SUCT 1200ML W/VALVE (MISCELLANEOUS) ×3 IMPLANT
CHLORAPREP W/TINT 26ML (MISCELLANEOUS) ×3 IMPLANT
CLOSURE WOUND 1/2 X4 (GAUZE/BANDAGES/DRESSINGS) ×1
DRAPE LAPAROTOMY 100X77 ABD (DRAPES) ×3 IMPLANT
DRSG TEGADERM 4X4.75 (GAUZE/BANDAGES/DRESSINGS) ×3 IMPLANT
DRSG TELFA 4X3 1S NADH ST (GAUZE/BANDAGES/DRESSINGS) ×3 IMPLANT
ELECT REM PT RETURN 9FT ADLT (ELECTROSURGICAL) ×3
ELECTRODE REM PT RTRN 9FT ADLT (ELECTROSURGICAL) ×1 IMPLANT
GAUZE SPONGE 4X4 12PLY STRL (GAUZE/BANDAGES/DRESSINGS) ×3 IMPLANT
GLOVE BIO SURGEON STRL SZ7.5 (GLOVE) ×3 IMPLANT
GLOVE INDICATOR 8.0 STRL GRN (GLOVE) ×3 IMPLANT
GOWN STRL REUS W/ TWL LRG LVL3 (GOWN DISPOSABLE) ×1 IMPLANT
GOWN STRL REUS W/ TWL XL LVL3 (GOWN DISPOSABLE) ×1 IMPLANT
GOWN STRL REUS W/TWL LRG LVL3 (GOWN DISPOSABLE) ×2
GOWN STRL REUS W/TWL XL LVL3 (GOWN DISPOSABLE) ×2
KIT TURNOVER KIT A (KITS) ×3 IMPLANT
LABEL OR SOLS (LABEL) ×3 IMPLANT
MARGIN MAP 10MM (MISCELLANEOUS) ×3 IMPLANT
NS IRRIG 500ML POUR BTL (IV SOLUTION) ×3 IMPLANT
PACK BASIN MINOR ARMC (MISCELLANEOUS) ×3 IMPLANT
PAD PREP 24X41 OB/GYN DISP (PERSONAL CARE ITEMS) ×3 IMPLANT
STRIP CLOSURE SKIN 1/2X4 (GAUZE/BANDAGES/DRESSINGS) ×2 IMPLANT
SUT ETHILON 3-0 FS-10 30 BLK (SUTURE) ×6
SUT ETHILON 4-0 (SUTURE) ×2
SUT ETHILON 4-0 FS2 18XMFL BLK (SUTURE) ×1
SUT VIC AB 2-0 CT1 (SUTURE) ×3 IMPLANT
SUT VIC AB 3-0 SH 27 (SUTURE) ×2
SUT VIC AB 3-0 SH 27X BRD (SUTURE) ×1 IMPLANT
SUT VIC AB 4-0 FS2 27 (SUTURE) ×3 IMPLANT
SUT VICRYL+ 3-0 144IN (SUTURE) ×3 IMPLANT
SUTURE EHLN 3-0 FS-10 30 BLK (SUTURE) ×2 IMPLANT
SUTURE ETHLN 4-0 FS2 18XMF BLK (SUTURE) ×1 IMPLANT
SWABSTK COMLB BENZOIN TINCTURE (MISCELLANEOUS) ×3 IMPLANT

## 2017-12-12 NOTE — Anesthesia Postprocedure Evaluation (Signed)
Anesthesia Post Note  Patient: Rachel Cox  Procedure(s) Performed: EXCISION BACK LIPOMA (N/A )  Patient location during evaluation: PACU Anesthesia Type: General Level of consciousness: awake and alert Pain management: pain level controlled Vital Signs Assessment: post-procedure vital signs reviewed and stable Respiratory status: spontaneous breathing and respiratory function stable Cardiovascular status: stable Anesthetic complications: no     Last Vitals:  Vitals:   12/12/17 1046 12/12/17 1058  BP: 124/72 125/71  Pulse: 82 79  Resp: 17 16  Temp:  36.4 C  SpO2: 95% 96%    Last Pain:  Vitals:   12/12/17 1046  TempSrc:   PainSc: 0-No pain                 KEPHART,WILLIAM K

## 2017-12-12 NOTE — Anesthesia Post-op Follow-up Note (Signed)
Anesthesia QCDR form completed.        

## 2017-12-12 NOTE — Anesthesia Preprocedure Evaluation (Signed)
Anesthesia Evaluation  Patient identified by MRN, date of birth, ID band Patient awake    Reviewed: Allergy & Precautions, NPO status , Patient's Chart, lab work & pertinent test results  History of Anesthesia Complications Negative for: history of anesthetic complications  Airway Mallampati: III       Dental   Pulmonary neg sleep apnea, neg COPD,           Cardiovascular hypertension, Pt. on medications (-) Past MI and (-) CHF (-) dysrhythmias (-) Valvular Problems/Murmurs     Neuro/Psych Seizures -, Well Controlled,  Anxiety Depression    GI/Hepatic GERD  Medicated and Controlled,  Endo/Other  neg diabetesHypothyroidism   Renal/GU Renal InsufficiencyRenal disease     Musculoskeletal   Abdominal   Peds  Hematology  (+) anemia ,   Anesthesia Other Findings   Reproductive/Obstetrics                             Anesthesia Physical Anesthesia Plan  ASA: III  Anesthesia Plan: General   Post-op Pain Management:    Induction: Intravenous  PONV Risk Score and Plan: 3 and TIVA and Propofol infusion  Airway Management Planned: Nasal Cannula  Additional Equipment:   Intra-op Plan:   Post-operative Plan:   Informed Consent: I have reviewed the patients History and Physical, chart, labs and discussed the procedure including the risks, benefits and alternatives for the proposed anesthesia with the patient or authorized representative who has indicated his/her understanding and acceptance.     Plan Discussed with:   Anesthesia Plan Comments:         Anesthesia Quick Evaluation

## 2017-12-12 NOTE — H&P (Signed)
Rachel Cox [id number] 10-28-1950     HPI: Large posterior neck mass for excision.   Medications Prior to Admission  Medication Sig Dispense Refill Last Dose  . fluticasone (FLONASE) 50 MCG/ACT nasal spray Place 2 sprays into both nostrils daily. (Patient taking differently: Place 2 sprays into both nostrils daily as needed for allergies. ) 48 g 3 12/11/2017 at Unknown time  . folic acid (FOLVITE) 497 MCG tablet Take 400 mcg by mouth daily.   12/11/2017 at Unknown time  . levETIRAcetam (KEPPRA) 250 MG tablet Take 250 mg by mouth at bedtime.    12/11/2017 at Unknown time  . levothyroxine (SYNTHROID, LEVOTHROID) 75 MCG tablet TAKE 1 TABLET BY MOUTH ONCE DAILY BEFORE BREAKFAST 30 tablet 0 12/12/2017 at Unknown time  . losartan-hydrochlorothiazide (HYZAAR) 100-25 MG tablet Take 1 tablet by mouth daily. 90 tablet 3 12/11/2017 at Unknown time  . mirtazapine (REMERON) 30 MG tablet Take 30 mg by mouth every morning.    12/12/2017 at Unknown time  . Multiple Vitamin (MULTIVITAMIN) tablet Take 1 tablet by mouth 3 (three) times a week.    12/11/2017 at Unknown time  . pantoprazole (PROTONIX) 40 MG tablet Take 40 mg by mouth at bedtime.    12/12/2017 at Unknown time  . Potassium Chloride ER 20 MEQ TBCR Take one tablet tonight, then three times on Thursday, once Friday morning. 5 tablet 0 12/12/2017 at Unknown time  . simvastatin (ZOCOR) 40 MG tablet Take 1 tablet (40 mg total) by mouth at bedtime. 90 tablet 3 12/11/2017 at Unknown time  . vitamin B-12 (CYANOCOBALAMIN) 500 MCG tablet Take 500 mcg by mouth daily.   12/11/2017 at Unknown time   Allergies  Allergen Reactions  . Aspirin Nausea Only  . Codeine Palpitations    Pt vocalized   Past Medical History:  Diagnosis Date  . Allergic rhinitis   . Allergy   . Anemia   . Anxiety   . Depression   . Epilepsy ([place])    managed by Dr. Rexene Alberts 2015/2016  . GERD (gastroesophageal reflux disease)   . History of abnormal cervical Pap smear 1980's  .  Hyperglycemia   . Hyperlipidemia   . Hypertension   . Hypothyroidism   . Insomnia   . Obesity (BMI 30.0-34.9) 01/06/2017  . Osteopenia   . Reflux esophagitis 10/31/2017   EGD   Past Surgical History:  Procedure Laterality Date  . COLONOSCOPY  Sept 2015  . COLONOSCOPY WITH PROPOFOL N/A 09/10/2017   Procedure: COLONOSCOPY WITH PROPOFOL;  Surgeon: Robert Bellow, MD;  Location: [facility] ENDOSCOPY;  Service: Endoscopy;  Laterality: N/A;  . Cryo Procedure  1980s   for abnormal pap  . ESOPHAGOGASTRODUODENOSCOPY (EGD) WITH PROPOFOL N/A 09/10/2017   Procedure: ESOPHAGOGASTRODUODENOSCOPY (EGD) WITH PROPOFOL;  Surgeon: Robert Bellow, MD;  Location: [facility] ENDOSCOPY;  Service: Endoscopy;  Laterality: N/A;  . ESOPHAGOGASTRODUODENOSCOPY (EGD) WITH PROPOFOL N/A 10/29/2017   Procedure: ESOPHAGOGASTRODUODENOSCOPY (EGD) WITH PROPOFOL;  Surgeon: Robert Bellow, MD;  Location: [facility] ENDOSCOPY;  Service: Endoscopy;  Laterality: N/A;  . TUBAL LIGATION  1969   Social History   Socioeconomic History  . Marital status: Married    Spouse name: Girard Cooter  . Number of children: 4  . Years of education: Not on file  . Highest education level: 10th grade  Occupational History  . Occupation: Retired  Scientific laboratory technician  . Financial resource strain: Not hard at all  . Food insecurity:    Worry: Never true    Inability:  Never true  . Transportation needs:    Medical: No    Non-medical: No  Tobacco Use  . Smoking status: Never Smoker  . Smokeless tobacco: Never Used  Substance and Sexual Activity  . Alcohol use: Yes    Comment: wine occ  . Drug use: No  . Sexual activity: Not Currently    Birth control/protection: Post-menopausal  Lifestyle  . Physical activity:    Days per week: 0 days    Minutes per session: 0 min  . Stress: Not at all  Relationships  . Social connections:    Talks on phone: Patient refused    Gets together: Patient refused    Attends religious service: Patient refused     Active member of club or organization: Patient refused    Attends meetings of clubs or organizations: Patient refused    Relationship status: Married  . Intimate partner violence:    Fear of current or ex partner: No    Emotionally abused: No    Physically abused: No    Forced sexual activity: No  Other Topics Concern  . Not on file  Social History Narrative  . Not on file   Social History   Social History Narrative  . Not on file     ROS: Negative.     PE: HEENT: Negative. Lungs: Clear. Cardio: RR. Neck: 8-9 cm posterior neck mass.     Assessment/Plan:  Proceed with planned excision of back lipoma.    Forest Gleason [facility] 12/12/2017

## 2017-12-12 NOTE — OR Nursing (Signed)
Discharge instructions discussed with pt and husband. Both voice understanding. 

## 2017-12-12 NOTE — Discharge Instructions (Signed)

## 2017-12-12 NOTE — Anesthesia Procedure Notes (Signed)
Procedure Name: Intubation Date/Time: 12/12/2017 9:15 AM Performed by: Nile Riggs, CRNA Pre-anesthesia Checklist: Patient identified, Emergency Drugs available, Suction available, Patient being monitored and Timeout performed Patient Re-evaluated:Patient Re-evaluated prior to induction Oxygen Delivery Method: Circle system utilized Preoxygenation: Pre-oxygenation with 100% oxygen Induction Type: IV induction Ventilation: Mask ventilation without difficulty Laryngoscope Size: Miller and 2 Grade View: Grade I Tube type: Oral Tube size: 7.0 mm Number of attempts: 1 Airway Equipment and Method: Stylet Placement Confirmation: ETT inserted through vocal cords under direct vision,  positive ETCO2,  CO2 detector and breath sounds checked- equal and bilateral Secured at: 21 cm Tube secured with: Tape Dental Injury: Teeth and Oropharynx as per pre-operative assessment

## 2017-12-12 NOTE — Transfer of Care (Signed)
Immediate Anesthesia Transfer of Care Note  Patient: Rachel Cox  Procedure(s) Performed: EXCISION BACK LIPOMA (N/A )  Patient Location: PACU  Anesthesia Type:General  Level of Consciousness: drowsy and patient cooperative  Airway & Oxygen Therapy: Patient Spontanous Breathing and Patient connected to face mask oxygen  Post-op Assessment: Report given to RN, Post -op Vital signs reviewed and stable and Patient moving all extremities  Post vital signs: Reviewed and stable  Last Vitals:  Vitals Value Taken Time  BP 130/65 12/12/2017 10:15 AM  Temp 36.2 C 12/12/2017 10:15 AM  Pulse 96 12/12/2017 10:19 AM  Resp 16 12/12/2017 10:19 AM  SpO2 100 % 12/12/2017 10:19 AM  Vitals shown include unvalidated device data.  Last Pain:  Vitals:   12/12/17 1015  TempSrc:   PainSc: Asleep         Complications: No apparent anesthesia complications

## 2017-12-12 NOTE — Op Note (Signed)
Preoperative diagnosis: Posterior neck lipoma.  Postoperative diagnosis: Same.  Operative procedure: Excision of posterior neck lipoma.  Operating Surgeon: [name], MD.  Anesthesia: General endotracheal, Marcaine 0.5% with 1 to 200,000 units of epinephrine, 30 cc.  Estimated blood loss: 2 cc.  Clinical note: This 67 year old woman is had a slowly enlarging mass in the posterior aspect of the neck which has become increasingly symptomatic especially with extension of the neck.  She presents today for excision.  Operative note: Patient underwent general endotracheal anesthesia was carefully rolled to the prone position.  The shoulders were taped inferiorly to provide better exposure of the lesion overlying the C6-7 area.  The area was cleansed with ChloraPrep and draped.  Marcaine was infiltrated for postoperative analgesia.  A transverse incision was made over the neck and this was then carried down to the deeper tissue electrocautery.  The sub-cutaneous fascia was opened and the lipoma-like mass measuring 8 cm in maximal diameter was identified.  This was dissected free from the adjacent soft tissue.  It was adherent to but not invading the underlying muscle fascia.  Hemostasis was with electrocautery.  The mass was orientated and then sent to pathology in formalin.  The deep adipose layer and superficial fascia was approximated to the deep fascia with a running 2-0 Vicryl suture.  This obliterated the dead space.  The superficial layer of the adipose tissue was approximated in similar fashion.  The skin was closed with a running 4-0 Vicryl subarticular suture.  Benzoin, Steri-Strips, Telfa and Tegaderm dressing applied.  The patient tolerated the procedure well and was brought to the recovery room in stable condition.

## 2017-12-15 ENCOUNTER — Other Ambulatory Visit: Payer: Self-pay | Admitting: Endocrinology

## 2017-12-15 ENCOUNTER — Encounter: Payer: Self-pay | Admitting: Endocrinology

## 2017-12-15 LAB — SURGICAL PATHOLOGY

## 2017-12-15 MED ORDER — LEVOTHYROXINE SODIUM 75 MCG PO TABS: ORAL_TABLET | ORAL | 0 refills | Status: DC

## 2017-12-16 ENCOUNTER — Encounter: Payer: Self-pay | Admitting: General Surgery

## 2017-12-16 ENCOUNTER — Ambulatory Visit (INDEPENDENT_AMBULATORY_CARE_PROVIDER_SITE_OTHER): Payer: PPO | Admitting: General Surgery

## 2017-12-16 VITALS — BP 142/76 | HR 90 | Resp 12 | Ht 66.0 in | Wt 183.0 lb

## 2017-12-16 DIAGNOSIS — D171 Benign lipomatous neoplasm of skin and subcutaneous tissue of trunk: Secondary | ICD-10-CM

## 2017-12-16 NOTE — Progress Notes (Signed)
Patient ID: Rachel Cox, female   DOB: 12/27/1950, 67 y.o.   MRN: [id number]  Chief Complaint  Patient presents with  . Routine Post Op    HPI Rachel Cox is a 67 y.o. female.  Here for postoperative visit, excision lipoma on her back on 12-12-17. She states she is doing well, no pain.   HPI  Past Medical History:  Diagnosis Date  . Allergic rhinitis   . Allergy   . Anemia   . Anxiety   . Depression   . Epilepsy ([place])    managed by Dr. Rexene Alberts 2015/2016  . GERD (gastroesophageal reflux disease)   . History of abnormal cervical Pap smear 1980's  . Hyperglycemia   . Hyperlipidemia   . Hypertension   . Hypothyroidism   . Insomnia   . Obesity (BMI 30.0-34.9) 01/06/2017  . Osteopenia   . Reflux esophagitis 10/31/2017   EGD    Past Surgical History:  Procedure Laterality Date  . COLONOSCOPY  Sept 2015  . COLONOSCOPY WITH PROPOFOL N/A 09/10/2017   Procedure: COLONOSCOPY WITH PROPOFOL;  Surgeon: Robert Bellow, MD;  Location: [facility];  Service: Endoscopy;  Laterality: N/A;  . Cryo Procedure  1980s   for abnormal pap  . ESOPHAGOGASTRODUODENOSCOPY (EGD) WITH PROPOFOL N/A 09/10/2017   Procedure: ESOPHAGOGASTRODUODENOSCOPY (EGD) WITH PROPOFOL;  Surgeon: Robert Bellow, MD;  Location: [facility];  Service: Endoscopy;  Laterality: N/A;  . ESOPHAGOGASTRODUODENOSCOPY (EGD) WITH PROPOFOL N/A 10/29/2017   Procedure: ESOPHAGOGASTRODUODENOSCOPY (EGD) WITH PROPOFOL;  Surgeon: Robert Bellow, MD;  Location: [facility];  Service: Endoscopy;  Laterality: N/A;  . LIPOMA EXCISION N/A 12/12/2017   Procedure: EXCISION BACK LIPOMA;  Surgeon: Robert Bellow, MD;  Location: [facility];  Service: General;  Laterality: N/A;  . TUBAL LIGATION  1969    Family History  Problem Relation Age of Onset  . Hyperlipidemia Mother   . Hypertension Mother   . CAD Mother   . Thyroid disease Mother   . Heart disease Mother   . COPD Father   . Thyroid disease Father   .  Cancer Brother        liver  . Alcohol abuse Brother   . Hypertension Brother   . Liver disease Brother   . Thyroid disease Sister   . Diabetes Neg Hx   . Stroke Neg Hx     Social History Social History   Tobacco Use  . Smoking status: Never Smoker  . Smokeless tobacco: Never Used  Substance Use Topics  . Alcohol use: Yes    Comment: wine occ  . Drug use: No    Allergies  Allergen Reactions  . Aspirin Nausea Only  . Codeine Palpitations    Pt vocalized    Current Outpatient Medications  Medication Sig Dispense Refill  . fluticasone (FLONASE) 50 MCG/ACT nasal spray Place 2 sprays into both nostrils daily. (Patient taking differently: Place 2 sprays into both nostrils daily as needed for allergies. ) 48 g 3  . folic acid (FOLVITE) 765 MCG tablet Take 400 mcg by mouth daily.    Marland Kitchen levETIRAcetam (KEPPRA) 250 MG tablet Take 250 mg by mouth at bedtime.     Marland Kitchen levothyroxine (SYNTHROID, LEVOTHROID) 75 MCG tablet Take 1 tablet daily 30 tablet 0  . losartan-hydrochlorothiazide (HYZAAR) 100-25 MG tablet Take 1 tablet by mouth daily. 90 tablet 3  . mirtazapine (REMERON) 30 MG tablet Take 30 mg by mouth every morning.     . Multiple Vitamin (  MULTIVITAMIN) tablet Take 1 tablet by mouth 3 (three) times a week.     . pantoprazole (PROTONIX) 40 MG tablet Take 40 mg by mouth at bedtime.     . Potassium Chloride ER 20 MEQ TBCR Take one tablet tonight, then three times on Thursday, once Friday morning. 5 tablet 0  . simvastatin (ZOCOR) 40 MG tablet Take 1 tablet (40 mg total) by mouth at bedtime. 90 tablet 3  . vitamin B-12 (CYANOCOBALAMIN) 500 MCG tablet Take 500 mcg by mouth daily.     No current facility-administered medications for this visit.     Review of Systems Review of Systems  Constitutional: Negative.   Respiratory: Negative.   Cardiovascular: Negative.     Blood pressure (!) 142/76, pulse 90, resp. rate 12, height 5\' 6"  (1.676 m), weight 183 lb (83 kg), SpO2 97  %.  Physical Exam Physical Exam  Constitutional: She is oriented to person, place, and time. She appears well-developed and well-nourished.  Neck:    Neurological: She is alert and oriented to person, place, and time.  Skin: Skin is warm and dry.  Incision intact with steri strips  Psychiatric: Her behavior is normal.    Data Reviewed DIAGNOSIS:  A. MASS, POSTERIOR NECK; EXCISION:  - LIPOMA.   Assessment    Doing well status post posterior neck lipoma excision.    Plan    Follow up as needed. The patient is aware to call back for any questions or concerns.       HPI, Physical Exam, Assessment and Plan have been scribed under the direction and in the presence of Robert Bellow, MD. [name], RN  I have completed the exam and reviewed the above documentation for accuracy and completeness.  I agree with the above.  Haematologist has been used and any errors in dictation or transcription are unintentional.  [name], M.D., F.A.C.S.  Forest Gleason Lawson [place] 12/16/2017, 8:54 PM

## 2017-12-16 NOTE — Patient Instructions (Signed)
The patient is aware to call back for any questions or concerns.  

## 2017-12-17 ENCOUNTER — Other Ambulatory Visit: Payer: PPO

## 2017-12-19 ENCOUNTER — Ambulatory Visit: Payer: PPO

## 2017-12-19 ENCOUNTER — Ambulatory Visit: Payer: PPO | Admitting: Oncology

## 2018-01-07 ENCOUNTER — Other Ambulatory Visit: Payer: Self-pay

## 2018-01-07 DIAGNOSIS — Z5181 Encounter for therapeutic drug level monitoring: Secondary | ICD-10-CM | POA: Diagnosis not present

## 2018-01-07 DIAGNOSIS — R809 Proteinuria, unspecified: Secondary | ICD-10-CM | POA: Diagnosis not present

## 2018-01-07 DIAGNOSIS — R739 Hyperglycemia, unspecified: Secondary | ICD-10-CM | POA: Diagnosis not present

## 2018-01-07 DIAGNOSIS — E782 Mixed hyperlipidemia: Secondary | ICD-10-CM | POA: Diagnosis not present

## 2018-01-08 LAB — LIPID PANEL
CHOLESTEROL: 190 mg/dL (ref <200)
HDL: 65 mg/dL (ref >50)
LDL Cholesterol (Calc): 102 mg/dL (calc) — ABNORMAL HIGH
Non-HDL Cholesterol (Calc): 125 mg/dL (calc) (ref <130)
Total CHOL/HDL Ratio: 2.9 (calc) (ref <5.0)
Triglycerides: 134 mg/dL (ref <150)

## 2018-01-08 LAB — COMPLETE METABOLIC PANEL WITH GFR
AG Ratio: 1.4 (calc) (ref 1.0–2.5)
ALT: 11 U/L (ref 6–29)
AST: 17 U/L (ref 10–35)
Albumin: 3.9 g/dL (ref 3.6–5.1)
Alkaline phosphatase (APISO): 79 U/L (ref 33–130)
BILIRUBIN TOTAL: 0.6 mg/dL (ref 0.2–1.2)
BUN: 14 mg/dL (ref 7–25)
CALCIUM: 9.6 mg/dL (ref 8.6–10.4)
CHLORIDE: 104 mmol/L (ref 98–110)
CO2: 28 mmol/L (ref 20–32)
Creat: 0.96 mg/dL (ref 0.50–0.99)
GFR, EST AFRICAN AMERICAN: 70 mL/min/{1.73_m2} (ref >=60)
GFR, EST NON AFRICAN AMERICAN: 60 mL/min/{1.73_m2} (ref >=60)
GLUCOSE: 93 mg/dL (ref 65–99)
Globulin: 2.7 g/dL (calc) (ref 1.9–3.7)
Potassium: 3.7 mmol/L (ref 3.5–5.3)
Sodium: 140 mmol/L (ref 135–146)
TOTAL PROTEIN: 6.6 g/dL (ref 6.1–8.1)

## 2018-01-08 LAB — MICROALBUMIN / CREATININE URINE RATIO
Creatinine, Urine: 69 mg/dL (ref 20–275)
MICROALB UR: 0.7 mg/dL
MICROALB/CREAT RATIO: 10 ug/mg{creat} (ref <30)

## 2018-01-08 LAB — HEMOGLOBIN A1C
EAG (MMOL/L): 6.3 (calc)
Hgb A1c MFr Bld: 5.6 % of total Hgb (ref <5.7)
Mean Plasma Glucose: 114 (calc)

## 2018-01-09 ENCOUNTER — Encounter: Payer: Self-pay | Admitting: Family Medicine

## 2018-02-14 ENCOUNTER — Other Ambulatory Visit: Payer: Self-pay | Admitting: Endocrinology

## 2018-02-14 ENCOUNTER — Encounter: Payer: Self-pay | Admitting: Endocrinology

## 2018-02-17 ENCOUNTER — Other Ambulatory Visit: Payer: Self-pay | Admitting: Endocrinology

## 2018-02-18 ENCOUNTER — Other Ambulatory Visit: Payer: Self-pay

## 2018-02-18 MED ORDER — LEVOTHYROXINE SODIUM 75 MCG PO TABS: ORAL_TABLET | ORAL | 2 refills | Status: DC

## 2018-03-10 ENCOUNTER — Ambulatory Visit (INDEPENDENT_AMBULATORY_CARE_PROVIDER_SITE_OTHER): Payer: PPO

## 2018-03-10 DIAGNOSIS — Z23 Encounter for immunization: Secondary | ICD-10-CM

## 2018-05-22 ENCOUNTER — Encounter: Payer: Self-pay | Admitting: Family Medicine

## 2018-05-22 MED ORDER — LOSARTAN POTASSIUM 100 MG PO TABS: 100.0000 mg | ORAL_TABLET | Freq: Every day | ORAL | 1 refills | Status: DC

## 2018-05-22 MED ORDER — HYDROCHLOROTHIAZIDE 25 MG PO TABS
25.0000 mg | ORAL_TABLET | Freq: Every day | ORAL | 1 refills | Status: DC
Start: 2018-05-22 — End: 2018-11-23

## 2018-06-01 ENCOUNTER — Ambulatory Visit (INDEPENDENT_AMBULATORY_CARE_PROVIDER_SITE_OTHER): Payer: PPO | Admitting: Family Medicine

## 2018-06-01 ENCOUNTER — Encounter: Payer: Self-pay | Admitting: Family Medicine

## 2018-06-01 DIAGNOSIS — G40802 Other epilepsy, not intractable, without status epilepticus: Secondary | ICD-10-CM

## 2018-06-01 DIAGNOSIS — N183 Chronic kidney disease, stage 3 unspecified: Secondary | ICD-10-CM

## 2018-06-01 DIAGNOSIS — E039 Hypothyroidism, unspecified: Secondary | ICD-10-CM | POA: Diagnosis not present

## 2018-06-01 DIAGNOSIS — E663 Overweight: Secondary | ICD-10-CM

## 2018-06-01 DIAGNOSIS — J3089 Other allergic rhinitis: Secondary | ICD-10-CM

## 2018-06-01 DIAGNOSIS — M8580 Other specified disorders of bone density and structure, unspecified site: Secondary | ICD-10-CM

## 2018-06-01 DIAGNOSIS — R739 Hyperglycemia, unspecified: Secondary | ICD-10-CM

## 2018-06-01 DIAGNOSIS — E782 Mixed hyperlipidemia: Secondary | ICD-10-CM | POA: Diagnosis not present

## 2018-06-01 DIAGNOSIS — I10 Essential (primary) hypertension: Secondary | ICD-10-CM

## 2018-06-01 NOTE — Assessment & Plan Note (Signed)
Encouraged DEXA scan, patient to schedule

## 2018-06-01 NOTE — Assessment & Plan Note (Signed)
Encouragement given to lose some weight; start low and build up gradually

## 2018-06-01 NOTE — Assessment & Plan Note (Signed)
Last TSH was June and followed by Dr. Loanne Drilling for this

## 2018-06-01 NOTE — Assessment & Plan Note (Signed)
Monitor Cr periodically; avoid NSAIDs; stay well-hydrated

## 2018-06-01 NOTE — Progress Notes (Signed)
BP 130/72   Pulse 92   Temp 97.9 F (36.6 C)   Ht 5\' 6"  (1.676 m)   Wt 183 lb 8 oz (83.2 kg)   SpO2 94%   BMI 29.62 kg/m    Subjective:    Patient ID: Rachel Cox, female    DOB: May 22, 1951, 67 y.o.   MRN: [id number]  HPI: Rachel Cox is a 67 y.o. female  Chief Complaint  Patient presents with  . Follow-up    HPI Here for f/u No medical excitement  HTN; checks BP away from doctor once in awhile; no highs or lows; room for improvement with salt reduction; she can try to reduce salt  Allergic rhinitis; doing pretty good; avoiding decongestants  Hypothyroidism; energy stable; moving bowels well; gained a little weight over the holidays but not from thyroid Lab Results  Component Value Date   TSH 3.45 11/06/2017   T3TOTAL 123 12/19/2014   T4TOTAL 14.2 (H) 12/19/2014   Epilepsy; last seizure was about 4 years; seeing Dr. Melrose Nakayama  CKD; no NSAIDs; good water drinker; urinating okay  Lab Results  Component Value Date   CREATININE 0.96 01/07/2018   No longer anemic; rectal bleeding resolved  Overweight; gym membership and will start back and build up slowly; she would like to be 150 pounds  Hx of high glucose, but the last 4 A1c have all been normal; no dry mouth; no first degree relatives have diabetes  High cholesterol; last LDL went up 40 points; bacon sometimes, once a week; not much cheese, not many eggs  Depression screen [facility] 2/9 06/01/2018 11/24/2017 11/14/2017 07/09/2017 01/06/2017  Decreased Interest 0 0 0 0 0  Down, Depressed, Hopeless 0 0 0 0 0  PHQ - 2 Score 0 0 0 0 0  Altered sleeping 0 0 0 - -  Tired, decreased energy 0 0 0 - -  Change in appetite 0 0 0 - -  Feeling bad or failure about yourself  0 0 0 - -  Trouble concentrating 0 0 0 - -  Moving slowly or fidgety/restless 0 0 0 - -  Suicidal thoughts 0 0 0 - -  PHQ-9 Score 0 0 0 - -  Difficult doing work/chores Not difficult at all Not difficult at all Not difficult at all - -   Fall Risk   06/01/2018 11/14/2017 07/09/2017 01/06/2017 11/20/2016  Falls in the past year? 0 No No No No    Relevant past medical, surgical, family and social history reviewed Past Medical History:  Diagnosis Date  . Allergic rhinitis   . Allergy   . Anemia   . Anxiety   . Depression   . Epilepsy ([place])    managed by Dr. Rexene Alberts 2015/2016  . GERD (gastroesophageal reflux disease)   . History of abnormal cervical Pap smear 1980's  . Hyperglycemia   . Hyperlipidemia   . Hypertension   . Hypothyroidism   . Insomnia   . Obesity (BMI 30.0-34.9) 01/06/2017  . Osteopenia   . Reflux esophagitis 10/31/2017   EGD   Past Surgical History:  Procedure Laterality Date  . COLONOSCOPY  Sept 2015  . COLONOSCOPY WITH PROPOFOL N/A 09/10/2017   Procedure: COLONOSCOPY WITH PROPOFOL;  Surgeon: Robert Bellow, MD;  Location: [facility];  Service: Endoscopy;  Laterality: N/A;  . Cryo Procedure  1980s   for abnormal pap  . ESOPHAGOGASTRODUODENOSCOPY (EGD) WITH PROPOFOL N/A 09/10/2017   Procedure: ESOPHAGOGASTRODUODENOSCOPY (EGD) WITH PROPOFOL;  Surgeon: [name]  W, MD;  Location: [facility];  Service: Endoscopy;  Laterality: N/A;  . ESOPHAGOGASTRODUODENOSCOPY (EGD) WITH PROPOFOL N/A 10/29/2017   Procedure: ESOPHAGOGASTRODUODENOSCOPY (EGD) WITH PROPOFOL;  Surgeon: Robert Bellow, MD;  Location: [facility];  Service: Endoscopy;  Laterality: N/A;  . LIPOMA EXCISION N/A 12/12/2017   Procedure: EXCISION BACK LIPOMA;  Surgeon: Robert Bellow, MD;  Location: [facility];  Service: General;  Laterality: N/A;  . TUBAL LIGATION  1969   Family History  Problem Relation Age of Onset  . Hyperlipidemia Mother   . Hypertension Mother   . CAD Mother   . Thyroid disease Mother   . Heart disease Mother   . COPD Father   . Thyroid disease Father   . Cancer Brother        liver  . Alcohol abuse Brother   . Hypertension Brother   . Liver disease Brother   . Thyroid disease Sister   .  Diabetes Neg Hx   . Stroke Neg Hx    Social History   Tobacco Use  . Smoking status: Never Smoker  . Smokeless tobacco: Never Used  Substance Use Topics  . Alcohol use: Yes    Comment: wine occ  . Drug use: No     [facility] Visit from 06/01/2018 in [facility]  AUDIT-C Score  3      Interim medical history since last visit reviewed. Allergies and medications reviewed  Review of Systems Per HPI unless specifically indicated above     Objective:    BP 130/72   Pulse 92   Temp 97.9 F (36.6 C)   Ht 5\' 6"  (1.676 m)   Wt 183 lb 8 oz (83.2 kg)   SpO2 94%   BMI 29.62 kg/m   Wt Readings from Last 3 Encounters:  06/01/18 183 lb 8 oz (83.2 kg)  12/16/17 183 lb (83 kg)  12/12/17 180 lb 1.6 oz (81.7 kg)    Physical Exam Constitutional:      General: She is not in acute distress.    Appearance: She is well-developed. She is not diaphoretic.  HENT:     Head: Normocephalic and atraumatic.  Eyes:     General: No scleral icterus. Neck:     Thyroid: No thyromegaly.  Cardiovascular:     Rate and Rhythm: Normal rate and regular rhythm.     Heart sounds: Normal heart sounds. No murmur.  Pulmonary:     Effort: Pulmonary effort is normal. No respiratory distress.     Breath sounds: Normal breath sounds. No wheezing.  Abdominal:     General: Bowel sounds are normal. There is no distension.     Palpations: Abdomen is soft.  Skin:    General: Skin is warm and dry.     Coloration: Skin is not pale.  Neurological:     Mental Status: She is alert.  Psychiatric:        Behavior: Behavior normal.        Thought Content: Thought content normal.        Judgment: Judgment normal.     Results for orders placed or performed in visit on 01/07/18  Microalbumin / creatinine urine ratio  Result Value Ref Range   Creatinine, Urine 69 20 - 275 mg/dL   Microalb, Ur 0.7 mg/dL   Microalb Creat Ratio 10 <30 mcg/mg creat  Lipid panel  Result Value Ref Range    Cholesterol 190 <200 mg/dL   HDL 65 >50 mg/dL  Triglycerides 134 <150 mg/dL   LDL Cholesterol (Calc) 102 (H) mg/dL (calc)   Total CHOL/HDL Ratio 2.9 <5.0 (calc)   Non-HDL Cholesterol (Calc) 125 <130 mg/dL (calc)  Hemoglobin A1c  Result Value Ref Range   Hgb A1c MFr Bld 5.6 <5.7 % of total Hgb   Mean Plasma Glucose 114 (calc)   eAG (mmol/L) 6.3 (calc)  COMPLETE METABOLIC PANEL WITH GFR  Result Value Ref Range   Glucose, Bld 93 65 - 99 mg/dL   BUN 14 7 - 25 mg/dL   Creat 0.96 0.50 - 0.99 mg/dL   GFR, Est Non African American 60 > OR = 60 mL/min/1.30m2   GFR, Est African American 70 > OR = 60 mL/min/1.32m2   BUN/Creatinine Ratio NOT APPLICABLE 6 - 22 (calc)   Sodium 140 135 - 146 mmol/L   Potassium 3.7 3.5 - 5.3 mmol/L   Chloride 104 98 - 110 mmol/L   CO2 28 20 - 32 mmol/L   Calcium 9.6 8.6 - 10.4 mg/dL   Total Protein 6.6 6.1 - 8.1 g/dL   Albumin 3.9 3.6 - 5.1 g/dL   Globulin 2.7 1.9 - 3.7 g/dL (calc)   AG Ratio 1.4 1.0 - 2.5 (calc)   Total Bilirubin 0.6 0.2 - 1.2 mg/dL   Alkaline phosphatase (APISO) 79 33 - 130 U/L   AST 17 10 - 35 U/L   ALT 11 6 - 29 U/L      Assessment & Plan:   Problem List Items Addressed This Visit      Cardiovascular and Mediastinum   Hypertension (Chronic)    Controlled; try to limit salt a little more; read labels        Respiratory   Allergic rhinitis    Doing well; avoid decongestants with her HTN        Endocrine   Hypothyroidism (Chronic)    Last TSH was June and followed by Dr. Loanne Drilling for this        Nervous and Auditory   Epilepsy ([place])    No recent seizures; doing well on medicine        Musculoskeletal and Integument   Osteopenia (Chronic)    Encouraged DEXA scan, patient to schedule        Genitourinary   CKD (chronic kidney disease) stage 3, GFR 30-59 ml/min ([facility]) (Chronic)    Monitor Cr periodically; avoid NSAIDs; stay well-hydrated        Other   Overweight (BMI 25.0-29.9) (Chronic)    Encouragement  given to lose some weight; start low and build up gradually      Hyperlipidemia (Chronic)    Encouraged fewer saturated fats; check lipids in February after a few months of getting back on track      Hyperglycemia    Check glucose in February          Follow up plan: Return in about 6 weeks (around 07/13/2018) for follow-up visit with Dr. Sanda Klein and fasting labs.  An after-visit summary was printed and given to the patient at [place].  Please see the patient instructions which may contain other information and recommendations beyond what is mentioned above in the assessment and plan.  No orders of the defined types were placed in this encounter.   No orders of the defined types were placed in this encounter.

## 2018-06-01 NOTE — Assessment & Plan Note (Signed)
Check glucose in February

## 2018-06-01 NOTE — Assessment & Plan Note (Signed)
Controlled; try to limit salt a little more; read labels

## 2018-06-01 NOTE — Assessment & Plan Note (Signed)
No recent seizures; doing well on medicine

## 2018-06-01 NOTE — Assessment & Plan Note (Signed)
Doing well; avoid decongestants with her HTN

## 2018-06-01 NOTE — Assessment & Plan Note (Signed)
Encouraged fewer saturated fats; check lipids in February after a few months of getting back on track

## 2018-07-13 ENCOUNTER — Encounter: Payer: Self-pay | Admitting: Family Medicine

## 2018-07-13 ENCOUNTER — Ambulatory Visit (INDEPENDENT_AMBULATORY_CARE_PROVIDER_SITE_OTHER): Payer: PPO | Admitting: Family Medicine

## 2018-07-13 DIAGNOSIS — R739 Hyperglycemia, unspecified: Secondary | ICD-10-CM

## 2018-07-13 DIAGNOSIS — I10 Essential (primary) hypertension: Secondary | ICD-10-CM

## 2018-07-13 DIAGNOSIS — K2100 Gastro-esophageal reflux disease with esophagitis, without bleeding: Secondary | ICD-10-CM

## 2018-07-13 DIAGNOSIS — G40802 Other epilepsy, not intractable, without status epilepticus: Secondary | ICD-10-CM | POA: Diagnosis not present

## 2018-07-13 DIAGNOSIS — M8580 Other specified disorders of bone density and structure, unspecified site: Secondary | ICD-10-CM

## 2018-07-13 DIAGNOSIS — J3089 Other allergic rhinitis: Secondary | ICD-10-CM

## 2018-07-13 DIAGNOSIS — Z5181 Encounter for therapeutic drug level monitoring: Secondary | ICD-10-CM | POA: Diagnosis not present

## 2018-07-13 DIAGNOSIS — E039 Hypothyroidism, unspecified: Secondary | ICD-10-CM

## 2018-07-13 DIAGNOSIS — D508 Other iron deficiency anemias: Secondary | ICD-10-CM

## 2018-07-13 DIAGNOSIS — E782 Mixed hyperlipidemia: Secondary | ICD-10-CM | POA: Diagnosis not present

## 2018-07-13 NOTE — Patient Instructions (Addendum)
Please do call to schedule your bone density study; the number to schedule one at either [facility] or [facility] is [phone] or [phone]  Keep up the good work with weight loss efforts  Obesity, Adult Obesity is the condition of having too much total body fat. Being overweight or obese means that your weight is greater than what is considered healthy for your body size. Obesity is determined by a measurement called BMI. BMI is an estimate of body fat and is calculated from height and weight. For adults, a BMI of 30 or higher is considered obese. Obesity can eventually lead to other health concerns and major illnesses, including:  Stroke.  Coronary artery disease (CAD).  Type 2 diabetes.  Some types of cancer, including cancers of the colon, breast, uterus, and gallbladder.  Osteoarthritis.  High blood pressure (hypertension).  High cholesterol.  Sleep apnea.  Gallbladder stones.  Infertility problems. What are the causes? The main cause of obesity is taking in (consuming) more calories than your body uses for energy. Other factors that contribute to this condition may include:  Being born with genes that make you more likely to become obese.  Having a medical condition that causes obesity. These conditions include: ? Hypothyroidism. ? Polycystic ovarian syndrome (PCOS). ? Binge-eating disorder. ? Cushing syndrome.  Taking certain medicines, such as steroids, antidepressants, and seizure medicines.  Not being physically active (sedentary lifestyle).  Living where there are limited places to exercise safely or buy healthy foods.  Not getting enough sleep. What increases the risk? The following factors may increase your risk of this condition:  Having a family history of obesity.  Being a woman of African-American descent.  Being a man of Hispanic descent. What are the signs or symptoms? Having excessive body fat is the main  symptom of this condition. How is this diagnosed? This condition may be diagnosed based on:  Your symptoms.  Your medical history.  A physical exam. Your health care provider may measure: ? Your BMI. If you are an adult with a BMI between 25 and less than 30, you are considered overweight. If you are an adult with a BMI of 30 or higher, you are considered obese. ? The distances around your hips and your waist (circumferences). These may be compared to each other to help diagnose your condition. ? Your skinfold thickness. Your health care provider may gently pinch a fold of your skin and measure it. How is this treated? Treatment for this condition often includes changing your lifestyle. Treatment may include some or all of the following:  Dietary changes. Work with your health care provider and a dietitian to set a weight-loss goal that is healthy and reasonable for you. Dietary changes may include eating: ? Smaller portions. A portion size is the amount of a particular food that is healthy for you to eat at one time. This varies from person to person. ? Low-calorie or low-fat options. ? More whole grains, fruits, and vegetables.  Regular physical activity. This may include aerobic activity (cardio) and strength training.  Medicine to help you lose weight. Your health care provider may prescribe medicine if you are unable to lose 1 pound a week after 6 weeks of eating more healthily and doing more physical activity.  Surgery. Surgical options may include gastric banding and gastric bypass. Surgery may be done if: ? Other treatments have not helped to improve your condition. ? You have a BMI of 40 or higher. ?  You have life-threatening health problems related to obesity. Follow these instructions at home:  Eating and drinking   Follow recommendations from your health care provider about what you eat and drink. Your health care provider may advise you to: ? Limit fast foods, sweets,  and processed snack foods. ? Choose low-fat options, such as low-fat milk instead of whole milk. ? Eat 5 or more servings of fruits or vegetables every day. ? Eat at home more often. This gives you more control over what you eat. ? Choose healthy foods when you eat out. ? Learn what a healthy portion size is. ? Keep low-fat snacks on hand. ? Avoid sugary drinks, such as soda, fruit juice, iced tea sweetened with sugar, and flavored milk. ? Eat a healthy breakfast.  Drink enough water to keep your urine clear or pale yellow.  Do not go without eating for long periods of time (do not fast) or follow a fad diet. Fasting and fad diets can be unhealthy and even dangerous. Physical Activity  Exercise regularly, as told by your health care provider. Ask your health care provider what types of exercise are safe for you and how often you should exercise.  Warm up and stretch before being active.  Cool down and stretch after being active.  Rest between periods of activity. Lifestyle  Limit the time that you spend in front of your TV, computer, or video game system.  Find ways to reward yourself that do not involve food.  Limit alcohol intake to no more than 1 drink a day for nonpregnant women and 2 drinks a day for men. One drink equals 12 oz of beer, 5 oz of wine, or 1 oz of hard liquor. General instructions  Keep a weight loss journal to keep track of the food you eat and how much you exercise you get.  Take over-the-counter and prescription medicines only as told by your health care provider.  Take vitamins and supplements only as told by your health care provider.  Consider joining a support group. Your health care provider may be able to recommend a support group.  Keep all follow-up visits as told by your health care provider. This is important. Contact a health care provider if:  You are unable to meet your weight loss goal after 6 weeks of dietary and lifestyle changes. This  information is not intended to replace advice given to you by your health care provider. Make sure you discuss any questions you have with your health care provider. Document Released: 06/27/2004 Document Revised: 10/23/2015 Document Reviewed: 03/08/2015 Elsevier Interactive Patient Education  2019 Elsevier Inc.  Preventing Unhealthy [employer], Adult Staying at a healthy weight is important to your overall health. When fat builds up in your body, you may become overweight or obese. Being overweight or obese increases your risk of developing certain health problems, such as heart disease, diabetes, sleeping problems, joint problems, and some types of cancer. Unhealthy weight gain is often the result of making unhealthy food choices or not getting enough exercise. You can make changes to your lifestyle to prevent obesity and stay as healthy as possible. What nutrition changes can be made?   Eat only as much as your body needs. To do this: ? Pay attention to signs that you are hungry or full. Stop eating as soon as you feel full. ? If you feel hungry, try drinking water first before eating. Drink enough water so your urine is clear or pale yellow. ? Eat  smaller portions. Pay attention to portion sizes when eating out. ? Look at serving sizes on food labels. Most foods contain more than one serving per container. ? Eat the recommended number of calories for your gender and activity level. For most active people, a daily total of 2,000 calories is appropriate. If you are trying to lose weight or are not very active, you may need to eat fewer calories. Talk with your health care provider or a diet and nutrition specialist (dietitian) about how many calories you need each day.  Choose healthy foods, such as: ? Fruits and vegetables. At each meal, try to fill at least half of your plate with fruits and vegetables. ? Whole grains, such as whole-wheat bread, brown rice, and quinoa. ? Lean meats, such as  chicken or fish. ? Other healthy proteins, such as beans, eggs, or tofu. ? Healthy fats, such as nuts, seeds, fatty fish, and olive oil. ? Low-fat or fat-free dairy products.  Check food labels, and avoid food and drinks that: ? Are high in calories. ? Have added sugar. ? Are high in sodium. ? Have saturated fats or trans fats.  Cook foods in healthier ways, such as by baking, broiling, or grilling.  Make a meal plan for the week, and shop with a grocery list to help you stay on track with your purchases. Try to avoid going to the grocery store when you are hungry.  When grocery shopping, try to shop around the outside of the store first, where the fresh foods are. Doing this helps you to avoid prepackaged foods, which can be high in sugar, salt (sodium), and fat. What lifestyle changes can be made?   Exercise for 30 or more minutes on 5 or more days each week. Exercising may include brisk walking, yard work, biking, running, swimming, and team sports like basketball and soccer. Ask your health care provider which exercises are safe for you.  Do muscle-strengthening activities, such as lifting weights or using resistance bands, on 2 or more days a week.  Do not use any products that contain nicotine or tobacco, such as cigarettes and e-cigarettes. If you need help quitting, ask your health care provider.  Limit alcohol intake to no more than 1 drink a day for nonpregnant women and 2 drinks a day for men. One drink equals 12 oz of beer, 5 oz of wine, or 1 oz of hard liquor.  Try to get 7-9 hours of sleep each night. What other changes can be made?  Keep a food and activity journal to keep track of: ? What you ate and how many calories you had. Remember to count the calories in sauces, dressings, and side dishes. ? Whether you were active, and what exercises you did. ? Your calorie, weight, and activity goals.  Check your weight regularly. Track any changes. If you notice you have  gained weight, make changes to your diet or activity routine.  Avoid taking weight-loss medicines or supplements. Talk to your health care provider before starting any new medicine or supplement.  Talk to your health care provider before trying any new diet or exercise plan. Why are these changes important? Eating healthy, staying active, and having healthy habits can help you to prevent obesity. Those changes also:  Help you manage stress and emotions.  Help you connect with friends and family.  Improve your self-esteem.  Improve your sleep.  Prevent long-term health problems. What can happen if changes are not made? Being obese or  overweight can cause you to develop joint or bone problems, which can make it hard for you to stay active or do activities you enjoy. Being obese or overweight also puts stress on your heart and lungs and can lead to health problems like diabetes, heart disease, and some cancers. Where to find more information Talk with your health care provider or a dietitian about healthy eating and healthy lifestyle choices. You may also find information from:  U.S. Department of Agriculture, MyPlate: FormerBoss.no  American Heart Association: www.heart.org  Centers for Disease Control and Prevention: http://www.wolf.info/ Summary  Staying at a healthy weight is important to your overall health. It helps you to prevent certain diseases and health problems, such as heart disease, diabetes, joint problems, sleep disorders, and some types of cancer.  Being obese or overweight can cause you to develop joint or bone problems, which can make it hard for you to stay active or do activities you enjoy.  You can prevent unhealthy weight gain by eating a healthy diet, exercising regularly, not smoking, limiting alcohol, and getting enough sleep.  Talk with your health care provider or a dietitian for guidance about healthy eating and healthy lifestyle choices. This information  is not intended to replace advice given to you by your health care provider. Make sure you discuss any questions you have with your health care provider. Document Released: 05/21/2016 Document Revised: 02/28/2017 Document Reviewed: 06/26/2016 Elsevier Interactive Patient Education  2019 [employer].

## 2018-07-13 NOTE — Assessment & Plan Note (Signed)
Check glucose (fasting0 and A1c

## 2018-07-13 NOTE — Progress Notes (Signed)
BP 132/74   Pulse 98   Temp 98 F (36.7 C)   Ht 5\' 6"  (1.676 m)   Wt 182 lb 8 oz (82.8 kg)   SpO2 98%   BMI 29.46 kg/m    Subjective:    Patient ID: Rachel Cox, female    DOB: 08-26-1950, 68 y.o.   MRN: [id number]  HPI: Rachel Cox is a 68 y.o. female  Chief Complaint  Patient presents with  . Follow-up    HPI Here for f/u  Overweight; trying to lose weight; she is limiting her sodium, less meat; chicken and fish; but does like her bacon sometimes  HTN; good control; checking BP about once a month away from the doctors, 120 to 130 range; limiting salt for the most part; no decongestants  Allergic rhinitis; no decongestants; just flonase  Reflux esophagitis; just had evaluation with Dr. Fleet Contras; no blood in the stool; colonoscopy UTD too; tomato-based are triggers  Seizures; no seizure in maybe 4 years; seeing Dr. Melrose Nakayama, neurologist, on WEdnesday  Thyroid issues managed by endocrinologist; no major changes in bowel hanbits or energy   High cholesterol; just occasional bacon; more chicken and fish; loves cheese; 3 eggs a week  Lab Results  Component Value Date   CHOL 190 01/07/2018   HDL 65 01/07/2018   LDLCALC 102 (H) 01/07/2018   TRIG 134 01/07/2018   CHOLHDL 2.9 01/07/2018   Prediabetes; came truly fasting today; no dry mouth  Osteopenia; DEXA ordered in June; getting calcium; last vit D normal; taking supplement  Iron deficiency anemia No bleeding from anywhere  Depression screen [facility] 2/9 07/13/2018 06/01/2018 11/24/2017 11/14/2017 07/09/2017  Decreased Interest 0 0 0 0 0  Down, Depressed, Hopeless 0 0 0 0 0  PHQ - 2 Score 0 0 0 0 0  Altered sleeping 0 0 0 0 -  Tired, decreased energy 0 0 0 0 -  Change in appetite 0 0 0 0 -  Feeling bad or failure about yourself  0 0 0 0 -  Trouble concentrating 0 0 0 0 -  Moving slowly or fidgety/restless 0 0 0 0 -  Suicidal thoughts 0 0 0 0 -  PHQ-9 Score 0 0 0 0 -  Difficult doing work/chores Not difficult at  all Not difficult at all Not difficult at all Not difficult at all -   Fall Risk  06/01/2018 11/14/2017 07/09/2017 01/06/2017 11/20/2016  Falls in the past year? 0 No No No No    Relevant past medical, surgical, family and social history reviewed Past Medical History:  Diagnosis Date  . Allergic rhinitis   . Allergy   . Anemia   . Anxiety   . Depression   . Epilepsy ([place])    managed by Dr. Rexene Alberts 2015/2016  . GERD (gastroesophageal reflux disease)   . History of abnormal cervical Pap smear 1980's  . Hyperglycemia   . Hyperlipidemia   . Hypertension   . Hypothyroidism   . Insomnia   . Obesity (BMI 30.0-34.9) 01/06/2017  . Osteopenia   . Reflux esophagitis 10/31/2017   EGD   Past Surgical History:  Procedure Laterality Date  . COLONOSCOPY  Sept 2015  . COLONOSCOPY WITH PROPOFOL N/A 09/10/2017   Procedure: COLONOSCOPY WITH PROPOFOL;  Surgeon: Robert Bellow, MD;  Location: [facility] ENDOSCOPY;  Service: Endoscopy;  Laterality: N/A;  . Cryo Procedure  1980s   for abnormal pap  . ESOPHAGOGASTRODUODENOSCOPY (EGD) WITH PROPOFOL N/A 09/10/2017   Procedure:  ESOPHAGOGASTRODUODENOSCOPY (EGD) WITH PROPOFOL;  Surgeon: Robert Bellow, MD;  Location: [facility] ENDOSCOPY;  Service: Endoscopy;  Laterality: N/A;  . ESOPHAGOGASTRODUODENOSCOPY (EGD) WITH PROPOFOL N/A 10/29/2017   Procedure: ESOPHAGOGASTRODUODENOSCOPY (EGD) WITH PROPOFOL;  Surgeon: Robert Bellow, MD;  Location: [facility] ENDOSCOPY;  Service: Endoscopy;  Laterality: N/A;  . LIPOMA EXCISION N/A 12/12/2017   Procedure: EXCISION BACK LIPOMA;  Surgeon: Robert Bellow, MD;  Location: [facility] ORS;  Service: General;  Laterality: N/A;  . TUBAL LIGATION  1969   Family History  Problem Relation Age of Onset  . Hyperlipidemia Mother   . Hypertension Mother   . CAD Mother   . Thyroid disease Mother   . Heart disease Mother   . COPD Father   . Thyroid disease Father   . Cancer Brother        liver  . Alcohol abuse Brother   .  Hypertension Brother   . Liver disease Brother   . Thyroid disease Sister   . Diabetes Neg Hx   . Stroke Neg Hx    Social History   Tobacco Use  . Smoking status: Never Smoker  . Smokeless tobacco: Never Used  Substance Use Topics  . Alcohol use: Yes    Comment: wine occ  . Drug use: No     Office Visit from 07/13/2018 in [facility]  AUDIT-C Score  0      Interim medical history since last visit reviewed. Allergies and medications reviewed  Review of Systems Per HPI unless specifically indicated above     Objective:    BP 132/74   Pulse 98   Temp 98 F (36.7 C)   Ht 5\' 6"  (1.676 m)   Wt 182 lb 8 oz (82.8 kg)   SpO2 98%   BMI 29.46 kg/m   Wt Readings from Last 3 Encounters:  07/13/18 182 lb 8 oz (82.8 kg)  06/01/18 183 lb 8 oz (83.2 kg)  12/16/17 183 lb (83 kg)    Physical Exam Constitutional:      General: She is not in acute distress.    Appearance: She is well-developed. She is not diaphoretic.  HENT:     Head: Normocephalic and atraumatic.  Eyes:     General: No scleral icterus. Neck:     Thyroid: No thyromegaly.  Cardiovascular:     Rate and Rhythm: Normal rate and regular rhythm.     Heart sounds: Normal heart sounds. No murmur.  Pulmonary:     Effort: Pulmonary effort is normal. No respiratory distress.     Breath sounds: Normal breath sounds. No wheezing.  Abdominal:     General: Bowel sounds are normal. There is no distension.     Palpations: Abdomen is soft.  Skin:    General: Skin is warm and dry.     Coloration: Skin is not pale.  Neurological:     Mental Status: She is alert.  Psychiatric:        Behavior: Behavior normal.        Thought Content: Thought content normal.        Judgment: Judgment normal.     Results for orders placed or performed in visit on 01/07/18  Microalbumin / creatinine urine ratio  Result Value Ref Range   Creatinine, Urine 69 20 - 275 mg/dL   Microalb, Ur 0.7 mg/dL   Microalb Creat  Ratio 10 <30 mcg/mg creat  Lipid panel  Result Value Ref Range   Cholesterol 190 <200 mg/dL  HDL 65 >50 mg/dL   Triglycerides 134 <150 mg/dL   LDL Cholesterol (Calc) 102 (H) mg/dL (calc)   Total CHOL/HDL Ratio 2.9 <5.0 (calc)   Non-HDL Cholesterol (Calc) 125 <130 mg/dL (calc)  Hemoglobin A1c  Result Value Ref Range   Hgb A1c MFr Bld 5.6 <5.7 % of total Hgb   Mean Plasma Glucose 114 (calc)   eAG (mmol/L) 6.3 (calc)  COMPLETE METABOLIC PANEL WITH GFR  Result Value Ref Range   Glucose, Bld 93 65 - 99 mg/dL   BUN 14 7 - 25 mg/dL   Creat 0.96 0.50 - 0.99 mg/dL   GFR, Est Non African American 60 > OR = 60 mL/min/1.68m2   GFR, Est African American 70 > OR = 60 mL/min/1.9m2   BUN/Creatinine Ratio NOT APPLICABLE 6 - 22 (calc)   Sodium 140 135 - 146 mmol/L   Potassium 3.7 3.5 - 5.3 mmol/L   Chloride 104 98 - 110 mmol/L   CO2 28 20 - 32 mmol/L   Calcium 9.6 8.6 - 10.4 mg/dL   Total Protein 6.6 6.1 - 8.1 g/dL   Albumin 3.9 3.6 - 5.1 g/dL   Globulin 2.7 1.9 - 3.7 g/dL (calc)   AG Ratio 1.4 1.0 - 2.5 (calc)   Total Bilirubin 0.6 0.2 - 1.2 mg/dL   Alkaline phosphatase (APISO) 79 33 - 130 U/L   AST 17 10 - 35 U/L   ALT 11 6 - 29 U/L      Assessment & Plan:   Problem List Items Addressed This Visit      Cardiovascular and Mediastinum   Hypertension (Chronic)    Well-controlled; continue losartan; limit salt        Respiratory   Allergic rhinitis    Avoiding decongestants        Digestive   Reflux esophagitis    Taking protonix; just had scope; no blood in the stool; avoid triggers        Endocrine   Hypothyroidism (Chronic)    No changes; managed by endo        Nervous and Auditory   Epilepsy ([place])    Managed by neurologist, seeing him on Wednesday; no recent seizures; safety discussed; driving with his blessing        Musculoskeletal and Integument   Osteopenia (Chronic)    Practice fall precautions; calcium and vit D        Other   Medication  monitoring encounter    Monitor liver and kidneys      Relevant Orders   COMPLETE METABOLIC PANEL WITH GFR   Iron deficiency anemia    Check CBC, iron, ferritin      Relevant Orders   CBC   Iron, TIBC and Ferritin Panel   Hyperlipidemia (Chronic)    Check lipids today, limit saturated fats      Relevant Orders   Lipid panel   Hyperglycemia    Check glucose (fasting0 and A1c      Relevant Orders   Hemoglobin A1c       Follow up plan: Return in about 6 months (around 01/11/2019).  An after-visit summary was printed and given to the patient at [place].  Please see the patient instructions which may contain other information and recommendations beyond what is mentioned above in the assessment and plan.  No orders of the defined types were placed in this encounter.   Orders Placed This Encounter  Procedures  . Lipid panel  . Hemoglobin A1c  . COMPLETE  METABOLIC PANEL WITH GFR  . CBC  . Iron, TIBC and Ferritin Panel

## 2018-07-13 NOTE — Assessment & Plan Note (Signed)
Taking protonix; just had scope; no blood in the stool; avoid triggers

## 2018-07-13 NOTE — Assessment & Plan Note (Signed)
Practice fall precautions; calcium and vit D

## 2018-07-13 NOTE — Assessment & Plan Note (Signed)
No changes; managed by endo

## 2018-07-13 NOTE — Assessment & Plan Note (Signed)
Avoiding decongestants 

## 2018-07-13 NOTE — Assessment & Plan Note (Signed)
Check lipids today, limit saturated fats

## 2018-07-13 NOTE — Assessment & Plan Note (Signed)
Managed by neurologist, seeing him on Wednesday; no recent seizures; safety discussed; driving with his blessing

## 2018-07-13 NOTE — Assessment & Plan Note (Signed)
Well-controlled; continue losartan; limit salt

## 2018-07-13 NOTE — Assessment & Plan Note (Signed)
Monitor liver and kidneys 

## 2018-07-13 NOTE — Assessment & Plan Note (Signed)
Check CBC, iron, ferritin

## 2018-07-14 ENCOUNTER — Other Ambulatory Visit: Payer: Self-pay | Admitting: Family Medicine

## 2018-07-14 DIAGNOSIS — E782 Mixed hyperlipidemia: Secondary | ICD-10-CM

## 2018-07-14 LAB — COMPLETE METABOLIC PANEL WITH GFR
AG RATIO: 1.4 (calc) (ref 1.0–2.5)
ALBUMIN MSPROF: 4.3 g/dL (ref 3.6–5.1)
ALT: 11 U/L (ref 6–29)
AST: 19 U/L (ref 10–35)
Alkaline phosphatase (APISO): 84 U/L (ref 37–153)
BUN: 18 mg/dL (ref 7–25)
CO2: 28 mmol/L (ref 20–32)
CREATININE: 0.98 mg/dL (ref 0.50–0.99)
Calcium: 10.5 mg/dL — ABNORMAL HIGH (ref 8.6–10.4)
Chloride: 102 mmol/L (ref 98–110)
GFR, Est African American: 68 mL/min/{1.73_m2} (ref >=60)
GFR, Est Non African American: 59 mL/min/{1.73_m2} — ABNORMAL LOW (ref >=60)
GLOBULIN: 3 g/dL (ref 1.9–3.7)
Glucose, Bld: 91 mg/dL (ref 65–99)
Potassium: 4 mmol/L (ref 3.5–5.3)
SODIUM: 140 mmol/L (ref 135–146)
TOTAL PROTEIN: 7.3 g/dL (ref 6.1–8.1)
Total Bilirubin: 0.7 mg/dL (ref 0.2–1.2)

## 2018-07-14 LAB — CBC
HCT: 43.6 % (ref 35.0–45.0)
Hemoglobin: 14.4 g/dL (ref 11.7–15.5)
MCH: 29.2 pg (ref 27.0–33.0)
MCHC: 33 g/dL (ref 32.0–36.0)
MCV: 88.4 fL (ref 80.0–100.0)
MPV: 10.1 fL (ref 7.5–12.5)
PLATELETS: 312 10*3/uL (ref 140–400)
RBC: 4.93 10*6/uL (ref 3.80–5.10)
RDW: 13.1 % (ref 11.0–15.0)
WBC: 6 10*3/uL (ref 3.8–10.8)

## 2018-07-14 LAB — LIPID PANEL
CHOL/HDL RATIO: 2.6 (calc) (ref <5.0)
CHOLESTEROL: 201 mg/dL — AB (ref <200)
HDL: 78 mg/dL (ref >=50)
LDL CHOLESTEROL (CALC): 103 mg/dL — AB
Non-HDL Cholesterol (Calc): 123 mg/dL (calc) (ref <130)
TRIGLYCERIDES: 102 mg/dL (ref <150)

## 2018-07-14 LAB — IRON,TIBC AND FERRITIN PANEL
%SAT: 28 % (calc) (ref 16–45)
Ferritin: 10 ng/mL — ABNORMAL LOW (ref 16–288)
Iron: 110 ug/dL (ref 45–160)
TIBC: 397 ug/dL (ref 250–450)

## 2018-07-14 LAB — HEMOGLOBIN A1C
Hgb A1c MFr Bld: 5.6 % of total Hgb (ref <5.7)
Mean Plasma Glucose: 114 (calc)
eAG (mmol/L): 6.3 (calc)

## 2018-07-14 MED ORDER — ATORVASTATIN CALCIUM 40 MG PO TABS: 40.0000 mg | ORAL_TABLET | Freq: Every day | ORAL | 0 refills | Status: DC

## 2018-07-15 DIAGNOSIS — R569 Unspecified convulsions: Secondary | ICD-10-CM | POA: Insufficient documentation

## 2018-07-15 DIAGNOSIS — I10 Essential (primary) hypertension: Secondary | ICD-10-CM | POA: Diagnosis not present

## 2018-08-12 ENCOUNTER — Encounter: Payer: Self-pay | Admitting: Family Medicine

## 2018-09-07 ENCOUNTER — Encounter: Payer: Self-pay | Admitting: Family Medicine

## 2018-09-07 MED ORDER — ATORVASTATIN CALCIUM 40 MG PO TABS: 40.0000 mg | ORAL_TABLET | Freq: Every day | ORAL | 0 refills | Status: DC

## 2018-09-28 ENCOUNTER — Other Ambulatory Visit: Payer: Self-pay | Admitting: General Surgery

## 2018-10-30 ENCOUNTER — Other Ambulatory Visit: Payer: Self-pay

## 2018-11-03 ENCOUNTER — Other Ambulatory Visit: Payer: Self-pay

## 2018-11-03 ENCOUNTER — Ambulatory Visit: Payer: PPO | Admitting: Endocrinology

## 2018-11-03 ENCOUNTER — Encounter: Payer: Self-pay | Admitting: Endocrinology

## 2018-11-03 VITALS — BP 142/70 | HR 88 | Ht 66.0 in | Wt 183.6 lb

## 2018-11-03 DIAGNOSIS — E039 Hypothyroidism, unspecified: Secondary | ICD-10-CM | POA: Diagnosis not present

## 2018-11-03 LAB — T4, FREE: Free T4: 1.14 ng/dL (ref 0.60–1.60)

## 2018-11-03 LAB — TSH: TSH: 1.71 u[IU]/mL (ref 0.35–4.50)

## 2018-11-03 NOTE — Progress Notes (Signed)
Subjective:    Patient ID: Rachel Cox, female    DOB: 1950/11/17, 68 y.o.   MRN: [id number]  HPI Pt returns for f/u of chronic primary hypothyroidism (dx'ed 2008; she has been on prescribed thyroid hormone therapy since then; she was on synthroid 150 mcg/day at one point, but she now requires less; US shows just 1 small nodule).   pt states she feels well in general.  She does not notice the goiter.  Past Medical History:  Diagnosis Date  . Allergic rhinitis   . Allergy   . Anemia   . Anxiety   . Depression   . Epilepsy ([place])    managed by Dr. Rexene Alberts 2015/2016  . GERD (gastroesophageal reflux disease)   . History of abnormal cervical Pap smear 1980's  . Hyperglycemia   . Hyperlipidemia   . Hypertension   . Hypothyroidism   . Insomnia   . Obesity (BMI 30.0-34.9) 01/06/2017  . Osteopenia   . Reflux esophagitis 10/31/2017   EGD    Past Surgical History:  Procedure Laterality Date  . COLONOSCOPY  Sept 2015  . COLONOSCOPY WITH PROPOFOL N/A 09/10/2017   Procedure: COLONOSCOPY WITH PROPOFOL;  Surgeon: Robert Bellow, MD;  Location: [facility];  Service: Endoscopy;  Laterality: N/A;  . Cryo Procedure  1980s   for abnormal pap  . ESOPHAGOGASTRODUODENOSCOPY (EGD) WITH PROPOFOL N/A 09/10/2017   Procedure: ESOPHAGOGASTRODUODENOSCOPY (EGD) WITH PROPOFOL;  Surgeon: Robert Bellow, MD;  Location: [facility];  Service: Endoscopy;  Laterality: N/A;  . ESOPHAGOGASTRODUODENOSCOPY (EGD) WITH PROPOFOL N/A 10/29/2017   Procedure: ESOPHAGOGASTRODUODENOSCOPY (EGD) WITH PROPOFOL;  Surgeon: Robert Bellow, MD;  Location: [facility];  Service: Endoscopy;  Laterality: N/A;  . LIPOMA EXCISION N/A 12/12/2017   Procedure: EXCISION BACK LIPOMA;  Surgeon: Robert Bellow, MD;  Location: [facility] ORS;  Service: General;  Laterality: N/A;  . [place] History   Socioeconomic History  . Marital status: Married    Spouse name: Girard Cooter  . Number of  children: 4  . Years of education: Not on file  . Highest education level: 10th grade  Occupational History  . Occupation: Retired  Scientific laboratory technician  . Financial resource strain: Not hard at all  . Food insecurity:    Worry: Never true    Inability: Never true  . Transportation needs:    Medical: No    Non-medical: No  Tobacco Use  . Smoking status: Never Smoker  . Smokeless tobacco: Never Used  Substance and Sexual Activity  . Alcohol use: Yes    Comment: wine occ  . Drug use: No  . Sexual activity: Not Currently    Birth control/protection: Post-menopausal  Lifestyle  . Physical activity:    Days per week: 0 days    Minutes per session: 0 min  . Stress: Not at all  Relationships  . Social connections:    Talks on phone: Patient refused    Gets together: Patient refused    Attends religious service: Patient refused    Active member of club or organization: Patient refused    Attends meetings of clubs or organizations: Patient refused    Relationship status: Married  . Intimate partner violence:    Fear of current or ex partner: No    Emotionally abused: No    Physically abused: No    Forced sexual activity: No  Other Topics Concern  . Not on file  Social History Narrative  .  Not on file    Current Outpatient Medications on File Prior to Visit  Medication Sig Dispense Refill  . atorvastatin (LIPITOR) 40 MG tablet Take 1 tablet (40 mg total) by mouth at bedtime. 90 tablet 0  . fluticasone (FLONASE) 50 MCG/ACT nasal spray Place 2 sprays into both nostrils daily. (Patient taking differently: Place 2 sprays into both nostrils daily as needed for allergies. ) 48 g 3  . folic acid (FOLVITE) 694 MCG tablet Take 400 mcg by mouth daily.    . hydrochlorothiazide (HYDRODIURIL) 25 MG tablet Take 1 tablet (25 mg total) by mouth daily. 90 tablet 1  . levETIRAcetam (KEPPRA) 250 MG tablet Take 250 mg by mouth at bedtime.     Marland Kitchen losartan (COZAAR) 100 MG tablet Take 1 tablet (100 mg  total) by mouth daily. 90 tablet 1  . mirtazapine (REMERON) 30 MG tablet Take 30 mg by mouth every morning.     . Multiple Vitamin (MULTIVITAMIN) tablet Take 1 tablet by mouth 3 (three) times a week.     . pantoprazole (PROTONIX) 40 MG tablet Take 1 tablet by mouth twice daily 60 tablet 0  . Potassium Chloride ER 20 MEQ TBCR Take one tablet tonight, then three times on Thursday, once Friday morning. 5 tablet 0  . vitamin B-12 (CYANOCOBALAMIN) 500 MCG tablet Take 500 mcg by mouth daily.     No current facility-administered medications on file prior to visit.     Allergies  Allergen Reactions  . Aspirin Nausea Only  . Codeine Palpitations    Pt vocalized    Family History  Problem Relation Age of Onset  . Hyperlipidemia Mother   . Hypertension Mother   . CAD Mother   . Thyroid disease Mother   . Heart disease Mother   . COPD Father   . Thyroid disease Father   . Cancer Brother        liver  . Alcohol abuse Brother   . Hypertension Brother   . Liver disease Brother   . Thyroid disease Sister   . Diabetes Neg Hx   . Stroke Neg Hx     BP (!) 142/70 (BP Location: Left Arm, Patient Position: Sitting, Cuff Size: Normal)   Pulse 88   Ht 5\' 6"  (1.676 m)   Wt 183 lb 9.6 oz (83.3 kg)   SpO2 97%   BMI 29.63 kg/m    Review of Systems Denies neck pain.     Objective:   Physical Exam VITAL SIGNS:  See vs page GENERAL: no distress NECK: There is no palpable thyroid enlargement.  No thyroid nodule is palpable.  No palpable lymphadenopathy at the anterior neck.   Lab Results  Component Value Date   TSH 1.71 11/03/2018   T3TOTAL 123 12/19/2014   T4TOTAL 14.2 (H) 12/19/2014      Assessment & Plan:  HTN: is noted today.  Hypothyroidism: well-replaced.   Patient Instructions  Your blood pressure is high today.  Please see your primary care provider soon, to have it rechecked You seem to need less thyroid medication than you used to.  This may be due to your having 2  different types of thyroid problems--chronic inflammation which causes a low thyroid, and lumps, which can cause a high thyroid.  We'll have to follow your blood tests to tell.   A thyroid blood test is requested for you today.  We'll let you know about the results.  Please come back for a follow-up appointment in 1  year.

## 2018-11-03 NOTE — Patient Instructions (Addendum)
Your blood pressure is high today.  Please see your primary care provider soon, to have it rechecked You seem to need less thyroid medication than you used to.  This may be due to your having 2 different types of thyroid problems--chronic inflammation which causes a low thyroid, and lumps, which can cause a high thyroid.  We'll have to follow your blood tests to tell.   A thyroid blood test is requested for you today.  We'll let you know about the results.  Please come back for a follow-up appointment in 1 year.

## 2018-11-04 ENCOUNTER — Other Ambulatory Visit: Payer: Self-pay | Admitting: Endocrinology

## 2018-11-04 MED ORDER — LEVOTHYROXINE SODIUM 75 MCG PO TABS: 75.0000 ug | ORAL_TABLET | Freq: Every day | ORAL | 3 refills | Status: DC

## 2018-11-04 NOTE — Telephone Encounter (Signed)
Please advise. Do you typically refill for 1 year?

## 2018-11-10 ENCOUNTER — Ambulatory Visit: Payer: PPO | Admitting: Endocrinology

## 2018-11-22 ENCOUNTER — Encounter: Payer: Self-pay | Admitting: Family Medicine

## 2018-11-23 ENCOUNTER — Other Ambulatory Visit: Payer: Self-pay

## 2018-11-23 ENCOUNTER — Other Ambulatory Visit: Payer: Self-pay | Admitting: Family Medicine

## 2018-11-23 ENCOUNTER — Other Ambulatory Visit: Payer: Self-pay | Admitting: Nurse Practitioner

## 2018-11-23 MED ORDER — LOSARTAN POTASSIUM 100 MG PO TABS: 100.0000 mg | ORAL_TABLET | Freq: Every day | ORAL | 0 refills | Status: DC

## 2018-11-23 NOTE — Telephone Encounter (Signed)
Copied from [place] [id number]. Topic: Quick Communication - Rx Refill/Question >> Nov 23, 2018  9:33 AM Carolyn Stare wrote: Medication losartan (COZAAR) 100 MG tablet  Preferred Pharmacy [facility]   Agent: Please be advised that RX refills may take up to 3 business days. We ask that you follow-up with your pharmacy.

## 2018-11-26 ENCOUNTER — Other Ambulatory Visit: Payer: Self-pay | Admitting: Nurse Practitioner

## 2018-11-26 MED ORDER — LOSARTAN POTASSIUM 100 MG PO TABS: 100.0000 mg | ORAL_TABLET | Freq: Every day | ORAL | 0 refills | Status: DC

## 2018-12-07 ENCOUNTER — Telehealth: Payer: Self-pay | Admitting: Family Medicine

## 2018-12-07 NOTE — Telephone Encounter (Signed)
Patient is calling to get a TOC to [name] Please advise thank you [phone]

## 2019-01-01 ENCOUNTER — Encounter: Payer: Self-pay | Admitting: General Surgery

## 2019-01-04 ENCOUNTER — Ambulatory Visit (INDEPENDENT_AMBULATORY_CARE_PROVIDER_SITE_OTHER): Payer: PPO | Admitting: Nurse Practitioner

## 2019-01-04 ENCOUNTER — Other Ambulatory Visit: Payer: Self-pay

## 2019-01-04 ENCOUNTER — Encounter: Payer: Self-pay | Admitting: Nurse Practitioner

## 2019-01-04 VITALS — BP 132/88 | HR 88 | Temp 98.0°F

## 2019-01-04 DIAGNOSIS — F19982 Other psychoactive substance use, unspecified with psychoactive substance-induced sleep disorder: Secondary | ICD-10-CM

## 2019-01-04 DIAGNOSIS — E782 Mixed hyperlipidemia: Secondary | ICD-10-CM | POA: Diagnosis not present

## 2019-01-04 DIAGNOSIS — E039 Hypothyroidism, unspecified: Secondary | ICD-10-CM | POA: Diagnosis not present

## 2019-01-04 DIAGNOSIS — I10 Essential (primary) hypertension: Secondary | ICD-10-CM | POA: Diagnosis not present

## 2019-01-04 DIAGNOSIS — G40802 Other epilepsy, not intractable, without status epilepticus: Secondary | ICD-10-CM

## 2019-01-04 DIAGNOSIS — F419 Anxiety disorder, unspecified: Secondary | ICD-10-CM | POA: Diagnosis not present

## 2019-01-04 DIAGNOSIS — F3341 Major depressive disorder, recurrent, in partial remission: Secondary | ICD-10-CM | POA: Diagnosis not present

## 2019-01-04 DIAGNOSIS — L239 Allergic contact dermatitis, unspecified cause: Secondary | ICD-10-CM | POA: Diagnosis not present

## 2019-01-04 MED ORDER — TRIAMCINOLONE ACETONIDE 0.1 % EX CREA: 1.0000 "application " | TOPICAL_CREAM | Freq: Two times a day (BID) | CUTANEOUS | 0 refills | Status: DC

## 2019-01-04 MED ORDER — KETOCONAZOLE 1 % EX SHAM: 1.0000 "application " | MEDICATED_SHAMPOO | CUTANEOUS | 3 refills | Status: DC

## 2019-01-04 MED ORDER — PANTOPRAZOLE SODIUM 40 MG PO TBEC: 40.0000 mg | DELAYED_RELEASE_TABLET | Freq: Every day | ORAL | 3 refills | Status: DC

## 2019-01-04 MED ORDER — LOSARTAN POTASSIUM 100 MG PO TABS: 100.0000 mg | ORAL_TABLET | Freq: Every day | ORAL | 0 refills | Status: DC

## 2019-01-04 MED ORDER — ATORVASTATIN CALCIUM 40 MG PO TABS: 40.0000 mg | ORAL_TABLET | Freq: Every day | ORAL | 3 refills | Status: DC

## 2019-01-04 MED ORDER — FLUTICASONE PROPIONATE 50 MCG/ACT NA SUSP: 2.0000 | Freq: Every day | NASAL | 3 refills | Status: DC | PRN

## 2019-01-04 MED ORDER — HYDROCHLOROTHIAZIDE 25 MG PO TABS: 25.0000 mg | ORAL_TABLET | Freq: Every day | ORAL | 3 refills | Status: DC

## 2019-01-04 NOTE — Assessment & Plan Note (Signed)
Chronic, ongoing.  Continue current medication regimen and adjust as needed.  Labs next visit.    

## 2019-01-04 NOTE — Assessment & Plan Note (Addendum)
Chronic, stable.  Continue current medication regimen, Remeron, and adjust as needed.  Denies SI/HI.

## 2019-01-04 NOTE — Assessment & Plan Note (Signed)
Chronic, ongoing.  Continue current medication regimen, Remeron, and adjust as needed.   

## 2019-01-04 NOTE — Assessment & Plan Note (Signed)
Chronic, ongoing with initial elevated but repeat at goal for age.  Continue current medication regimen and adjust as needed.  Recommend checking BP three mornings a week and documenting for provider.  Goal to avoid hypotension.  Return in 4 weeks for follow-up and labs.

## 2019-01-04 NOTE — Assessment & Plan Note (Signed)
Chronic, stable.  Continue current medication regimen, Remeron, and adjust as needed.  May consider Buspar for anxiety.  Avoid benzo due to age.   

## 2019-01-04 NOTE — Assessment & Plan Note (Signed)
Chronic, ongoing.  Continue current medication regimen as recommended by Dr. Lissa Merlin and collaboration with endo.

## 2019-01-04 NOTE — Assessment & Plan Note (Signed)
Has history of similar.  Script for Ketoconazole shampoo and Triamcinolone cream sent.  Recommend use of Claritin as needed.  If ongoing consider allergist referral.

## 2019-01-04 NOTE — Progress Notes (Signed)
New Patient Office Visit  Subjective:  Patient ID: Rachel Cox, female    DOB: 06-Jul-1950  Age: 68 y.o. MRN: [id number]  CC:  Chief Complaint  Patient presents with  . Establish Care  . Psoriasis    pt states she has this in her hairline     HPI Rachel Cox presents for new patient visit to establish care.  Introduced to Designer, jewellery role and practice setting.  All questions answered.  DRY SKIN AT HAIR LINE: Noticed it a few weeks ago and it has not gotten better.  Has history of allergic dermatitis in past. Duration:  weeks  Location: one patch at back of hair line  Itching: yes Burning: no Redness: yes Oozing: no Scaling: sometimes Blisters: no Painful: no Fevers: no Change in detergents/soaps/personal care products: no Recent illness: no Recent travel:no History of same: no Context: stable Alleviating factors: nothing Treatments attempted:nothing Shortness of breath: no  Throat/tongue swelling: no Myalgias/arthralgias: no   HYPERTENSION / HYPERLIPIDEMIA Continues on Losartan & HCTZ and Atorvastatin. Satisfied with current treatment? yes Duration of hypertension: chronic BP monitoring frequency: rarely BP range: 130's-140's over 70's, but rarely checks and needs new cuff BP medication side effects: no Duration of hyperlipidemia: chronic Cholesterol medication side effects: no Cholesterol supplements: none Medication compliance: good compliance Aspirin: no Recent stressors: no Recurrent headaches: no Visual changes: no Palpitations: no Dyspnea: no Chest pain: no Lower extremity edema: no Dizzy/lightheaded: no   SEIZURE DISORDER: Followed by Dr. Melrose Nakayama yearly and recently seen in February.  No Keppra level noted in note, but she reports they do check at her visits with them.  Denies any recent seizure activity.  States she has not had one in 10 years.  HYPOTHYROIDISM Followed by Dr. Loanne Drilling in [place], last seen in June with TSH 1.71 and  T4 1.14.  No changes made.  Continues on Levothyroxine 75 MCG daily. Thyroid control status:stable Satisfied with current treatment? yes Medication side effects: no Medication compliance: good compliance Recent dose adjustment:no Fatigue: no Cold intolerance: no Heat intolerance: no Weight gain: no Weight loss: no Constipation: no Diarrhea/loose stools: no Palpitations: no Lower extremity edema: no Anxiety/depressed mood: at baseline, no worse  DEPRESSION/ANXIETY WITH INSOMNIA Currently takes Remeron which is prescribed by neurology to benefit mood and sleep.  She inquired as to whether she would benefit from "a little nerve pill".  Discussed with patient BEERS Criteria of medications not recommended for her age group and the risks/benefits of benzo medications.  She reported this is "not something I wish to try then".  Wishes to maintain current medication regimen.  Discussed that Buspar may be option if worsening anxiety. Mood status: stable Satisfied with current treatment?: yes Symptom severity: mild  Duration of current treatment : chronic Side effects: no Medication compliance: good compliance Psychotherapy/counseling: none Depressed mood: no Anxious mood: sometimes Anhedonia: no Significant weight loss or gain: no Insomnia: yes hard to fall asleep Fatigue: no Feelings of worthlessness or guilt: no Impaired concentration/indecisiveness: no Suicidal ideations: no Hopelessness: no Crying spells: no Depression screen [facility] 2/9 01/04/2019 07/13/2018 06/01/2018 11/24/2017 11/14/2017  Decreased Interest 0 0 0 0 0  Down, Depressed, Hopeless 0 0 0 0 0  PHQ - 2 Score 0 0 0 0 0  Altered sleeping 0 0 0 0 0  Tired, decreased energy 0 0 0 0 0  Change in appetite 0 0 0 0 0  Feeling bad or failure about yourself  0 0 0 0 0  Trouble concentrating 0 0 0 0 0  Moving slowly or fidgety/restless 0 0 0 0 0  Suicidal thoughts 0 0 0 0 0  PHQ-9 Score 0 0 0 0 0  Difficult doing work/chores Not  difficult at all Not difficult at all Not difficult at all Not difficult at all Not difficult at all    Past Medical History:  Diagnosis Date  . Allergic rhinitis   . Allergy   . Anemia   . Anxiety   . Depression   . Epilepsy ([place])    managed by Dr. Rexene Alberts 2015/2016  . GERD (gastroesophageal reflux disease)   . History of abnormal cervical Pap smear 1980's  . Hyperglycemia   . Hyperlipidemia   . Hypertension   . Hypothyroidism   . Insomnia   . Obesity (BMI 30.0-34.9) 01/06/2017  . Osteopenia   . Reflux esophagitis 10/31/2017   EGD    Past Surgical History:  Procedure Laterality Date  . COLONOSCOPY  Sept 2015  . COLONOSCOPY WITH PROPOFOL N/A 09/10/2017   Procedure: COLONOSCOPY WITH PROPOFOL;  Surgeon: Robert Bellow, MD;  Location: [facility];  Service: Endoscopy;  Laterality: N/A;  . Cryo Procedure  1980s   for abnormal pap  . ESOPHAGOGASTRODUODENOSCOPY (EGD) WITH PROPOFOL N/A 09/10/2017   Procedure: ESOPHAGOGASTRODUODENOSCOPY (EGD) WITH PROPOFOL;  Surgeon: Robert Bellow, MD;  Location: [facility];  Service: Endoscopy;  Laterality: N/A;  . ESOPHAGOGASTRODUODENOSCOPY (EGD) WITH PROPOFOL N/A 10/29/2017   Procedure: ESOPHAGOGASTRODUODENOSCOPY (EGD) WITH PROPOFOL;  Surgeon: Robert Bellow, MD;  Location: [facility];  Service: Endoscopy;  Laterality: N/A;  . LIPOMA EXCISION N/A 12/12/2017   Procedure: EXCISION BACK LIPOMA;  Surgeon: Robert Bellow, MD;  Location: [facility] ORS;  Service: General;  Laterality: N/A;  . TUBAL LIGATION  1969    Family History  Problem Relation Age of Onset  . Hyperlipidemia Mother   . Hypertension Mother   . CAD Mother   . Thyroid disease Mother   . Heart disease Mother   . COPD Father   . Thyroid disease Father   . Cancer Brother        liver  . Alcohol abuse Brother   . Hypertension Brother   . Liver disease Brother   . Thyroid disease Sister   . Diabetes Neg Hx   . Stroke Neg Hx     Social History    Socioeconomic History  . Marital status: Married    Spouse name: Girard Cooter  . Number of children: 4  . Years of education: Not on file  . Highest education level: 10th grade  Occupational History  . Occupation: Retired  Scientific laboratory technician  . Financial resource strain: Not hard at all  . Food insecurity    Worry: Never true    Inability: Never true  . Transportation needs    Medical: No    Non-medical: No  Tobacco Use  . Smoking status: Never Smoker  . Smokeless tobacco: Never Used  Substance and Sexual Activity  . Alcohol use: Yes    Comment: wine occ  . Drug use: No  . Sexual activity: Not Currently    Birth control/protection: Post-menopausal  Lifestyle  . Physical activity    Days per week: 0 days    Minutes per session: 0 min  . Stress: Not at all  Relationships  . Social connections    Talks on phone: Patient refused    Gets together: Patient refused    Attends religious service: Patient refused  Active member of club or organization: Patient refused    Attends meetings of clubs or organizations: Patient refused    Relationship status: Married  . Intimate partner violence    Fear of current or ex partner: No    Emotionally abused: No    Physically abused: No    Forced sexual activity: No  Other Topics Concern  . Not on file  Social History Narrative  . Not on file    ROS Review of Systems  Constitutional: Negative for activity change, appetite change, diaphoresis, fatigue and fever.  Respiratory: Negative for cough, chest tightness and shortness of breath.   Cardiovascular: Negative for chest pain, palpitations and leg swelling.  Gastrointestinal: Negative for abdominal distention, abdominal pain, constipation, diarrhea, nausea and vomiting.  Endocrine: Negative for cold intolerance and heat intolerance.  Skin: Positive for rash.  Neurological: Negative for dizziness, syncope, weakness, light-headedness, numbness and headaches.  Psychiatric/Behavioral:  Positive for sleep disturbance. Negative for decreased concentration, self-injury and suicidal ideas. The patient is nervous/anxious.     Objective:   Today's Vitals: BP 132/88 (BP Location: Left Arm)   Pulse 88   Temp 98 F (36.7 C) (Oral)   SpO2 98%   Physical Exam Vitals signs and nursing note reviewed.  Constitutional:      General: She is awake. She is not in acute distress.    Appearance: She is well-developed. She is not ill-appearing.  HENT:     Head: Normocephalic.     Right Ear: Hearing normal.     Left Ear: Hearing normal.     Nose: Nose normal.     Mouth/Throat:     Mouth: Mucous membranes are moist.  Eyes:     General: Lids are normal.        Right eye: No discharge.        Left eye: No discharge.     Conjunctiva/sclera: Conjunctivae normal.     Pupils: Pupils are equal, round, and reactive to light.  Neck:     Musculoskeletal: Normal range of motion and neck supple.     Thyroid: No thyromegaly.     Vascular: No carotid bruit.  Cardiovascular:     Rate and Rhythm: Normal rate and regular rhythm.     Heart sounds: Normal heart sounds. No murmur. No gallop.   Pulmonary:     Effort: Pulmonary effort is normal. No accessory muscle usage or respiratory distress.     Breath sounds: Normal breath sounds.  Abdominal:     General: Bowel sounds are normal.     Palpations: Abdomen is soft.  Musculoskeletal:     Right lower leg: No edema.     Left lower leg: No edema.  Lymphadenopathy:     Cervical: No cervical adenopathy.  Skin:    General: Skin is warm and dry.     Findings: Rash present. Rash is scaling.     Comments: Small patch, approx 3 cm, to back lower hairline.  Area with flat erythema with mild scaling, no vesicles or drainage.  No warmth or tenderness to area.  No further patches noted.  Neurological:     Mental Status: She is alert and oriented to person, place, and time.  Psychiatric:        Attention and Perception: Attention normal.        Mood  and Affect: Mood normal.        Speech: Speech normal.        Behavior: Behavior normal. Behavior is  cooperative.        Thought Content: Thought content normal.        Judgment: Judgment normal.     Assessment & Plan:   Problem List Items Addressed This Visit      Cardiovascular and Mediastinum   Hypertension (Chronic)    Chronic, ongoing with initial elevated but repeat at goal for age.  Continue current medication regimen and adjust as needed.  Recommend checking BP three mornings a week and documenting for provider.  Goal to avoid hypotension.  Return in 4 weeks for follow-up and labs.      Relevant Medications   losartan (COZAAR) 100 MG tablet   atorvastatin (LIPITOR) 40 MG tablet   hydrochlorothiazide (HYDRODIURIL) 25 MG tablet     Endocrine   Hypothyroidism (Chronic)    Chronic, ongoing.  Continue current medication regimen as recommended by Dr. Lissa Merlin and collaboration with endo.          Nervous and Auditory   Epilepsy ([place]) - Primary    Chronic, stable.  Continue current medication regimen as recommended by neuro.          Musculoskeletal and Integument   Allergic dermatitis    Has history of similar.  Script for Ketoconazole shampoo and Triamcinolone cream sent.  Recommend use of Claritin as needed.  If ongoing consider allergist referral.        Other   Hyperlipidemia (Chronic)    Chronic, ongoing.  Continue current medication regimen and adjust as needed.  Labs next visit.        Relevant Medications   losartan (COZAAR) 100 MG tablet   atorvastatin (LIPITOR) 40 MG tablet   hydrochlorothiazide (HYDRODIURIL) 25 MG tablet   Insomnia    Chronic, ongoing.  Continue current medication regimen, Remeron, and adjust as needed.        Depression    Chronic, stable.  Continue current medication regimen, Remeron, and adjust as needed.  Denies SI/HI.      Anxiety    Chronic, stable.  Continue current medication regimen, Remeron, and adjust as needed.  May  consider Buspar for anxiety.  Avoid benzo due to age.           Outpatient Encounter Medications as of 01/04/2019  Medication Sig  . atorvastatin (LIPITOR) 40 MG tablet Take 1 tablet (40 mg total) by mouth at bedtime.  . fluticasone (FLONASE) 50 MCG/ACT nasal spray Place 2 sprays into both nostrils daily as needed for allergies.  . folic acid (FOLVITE) 761 MCG tablet Take 400 mcg by mouth daily.  . hydrochlorothiazide (HYDRODIURIL) 25 MG tablet Take 1 tablet (25 mg total) by mouth daily.  Marland Kitchen levETIRAcetam (KEPPRA) 250 MG tablet Take 250 mg by mouth at bedtime.   Marland Kitchen levothyroxine (SYNTHROID) 75 MCG tablet Take 1 tablet (75 mcg total) by mouth daily. Take one tablet every morning  . losartan (COZAAR) 100 MG tablet Take 1 tablet (100 mg total) by mouth daily.  . mirtazapine (REMERON) 30 MG tablet Take 30 mg by mouth every morning.   . Multiple Vitamin (MULTIVITAMIN) tablet Take 1 tablet by mouth 3 (three) times a week.   . pantoprazole (PROTONIX) 40 MG tablet Take 1 tablet (40 mg total) by mouth daily.  . Potassium Chloride ER 20 MEQ TBCR Take one tablet tonight, then three times on Thursday, once Friday morning.  . vitamin B-12 (CYANOCOBALAMIN) 500 MCG tablet Take 500 mcg by mouth daily.  . [DISCONTINUED] atorvastatin (LIPITOR) 40 MG tablet Take 1 tablet (  40 mg total) by mouth at bedtime.  . [DISCONTINUED] fluticasone (FLONASE) 50 MCG/ACT nasal spray Place 2 sprays into both nostrils daily. (Patient taking differently: Place 2 sprays into both nostrils daily as needed for allergies. )  . [DISCONTINUED] hydrochlorothiazide (HYDRODIURIL) 25 MG tablet Take 1 tablet by mouth once daily  . [DISCONTINUED] losartan (COZAAR) 100 MG tablet Take 1 tablet (100 mg total) by mouth daily.  . [DISCONTINUED] pantoprazole (PROTONIX) 40 MG tablet Take 1 tablet by mouth twice daily  . KETOCONAZOLE, TOPICAL, 1 % SHAM Apply 1 application topically 3 (three) times a week.  . triamcinolone cream (KENALOG) 0.1 % Apply 1  application topically 2 (two) times daily.   No facility-administered encounter medications on file as of 01/04/2019.     Follow-up: Return in about 4 weeks (around 02/01/2019) for Follow-up (30 minutes), also needs Medicare wellness visit with tiffany.   Venita Lick, NP

## 2019-01-04 NOTE — Assessment & Plan Note (Signed)
Chronic, stable.  Continue current medication regimen as recommended by neuro.

## 2019-01-04 NOTE — Patient Instructions (Signed)

## 2019-01-11 ENCOUNTER — Ambulatory Visit: Payer: Self-pay | Admitting: Family Medicine

## 2019-01-11 ENCOUNTER — Ambulatory Visit: Payer: PPO | Admitting: Family Medicine

## 2019-02-01 ENCOUNTER — Ambulatory Visit (INDEPENDENT_AMBULATORY_CARE_PROVIDER_SITE_OTHER): Payer: PPO | Admitting: Nurse Practitioner

## 2019-02-01 ENCOUNTER — Other Ambulatory Visit: Payer: Self-pay

## 2019-02-01 ENCOUNTER — Telehealth: Payer: Self-pay | Admitting: Nurse Practitioner

## 2019-02-01 ENCOUNTER — Other Ambulatory Visit: Payer: Self-pay | Admitting: Nurse Practitioner

## 2019-02-01 ENCOUNTER — Ambulatory Visit (INDEPENDENT_AMBULATORY_CARE_PROVIDER_SITE_OTHER): Payer: PPO

## 2019-02-01 ENCOUNTER — Encounter: Payer: Self-pay | Admitting: Nurse Practitioner

## 2019-02-01 VITALS — BP 132/80 | HR 88 | Temp 97.9°F | Resp 16 | Ht 66.0 in | Wt 187.6 lb

## 2019-02-01 VITALS — BP 132/80 | HR 88 | Temp 97.9°F | Ht 66.0 in | Wt 187.6 lb

## 2019-02-01 DIAGNOSIS — E559 Vitamin D deficiency, unspecified: Secondary | ICD-10-CM | POA: Diagnosis not present

## 2019-02-01 DIAGNOSIS — Z1239 Encounter for other screening for malignant neoplasm of breast: Secondary | ICD-10-CM | POA: Diagnosis not present

## 2019-02-01 DIAGNOSIS — Z780 Asymptomatic menopausal state: Secondary | ICD-10-CM | POA: Diagnosis not present

## 2019-02-01 DIAGNOSIS — E782 Mixed hyperlipidemia: Secondary | ICD-10-CM | POA: Diagnosis not present

## 2019-02-01 DIAGNOSIS — D508 Other iron deficiency anemias: Secondary | ICD-10-CM

## 2019-02-01 DIAGNOSIS — N183 Chronic kidney disease, stage 3 unspecified: Secondary | ICD-10-CM

## 2019-02-01 DIAGNOSIS — I10 Essential (primary) hypertension: Secondary | ICD-10-CM

## 2019-02-01 DIAGNOSIS — Z23 Encounter for immunization: Secondary | ICD-10-CM | POA: Diagnosis not present

## 2019-02-01 DIAGNOSIS — Z0000 Encounter for general adult medical examination without abnormal findings: Secondary | ICD-10-CM

## 2019-02-01 MED ORDER — HYDROCHLOROTHIAZIDE 25 MG PO TABS: 25.0000 mg | ORAL_TABLET | Freq: Every day | ORAL | 3 refills | Status: DC

## 2019-02-01 MED ORDER — CHOLECALCIFEROL 25 MCG (1000 UT) PO TABS: 1000.0000 [IU] | ORAL_TABLET | Freq: Every day | ORAL | 3 refills | Status: DC

## 2019-02-01 MED ORDER — LOSARTAN POTASSIUM 100 MG PO TABS: 100.0000 mg | ORAL_TABLET | Freq: Every day | ORAL | 3 refills | Status: DC

## 2019-02-01 NOTE — Telephone Encounter (Signed)
Called pt to let her know that Vitamin D was sent in pt verbalized understanding and now knows about mychart

## 2019-02-01 NOTE — Assessment & Plan Note (Signed)
Chronic, ongoing.  Continue current medication regimen and adjust as needed.  Lipid panel and CMP today.    

## 2019-02-01 NOTE — Telephone Encounter (Signed)
Can I give a verbal for this and if so, can I please have an diagnosis

## 2019-02-01 NOTE — Assessment & Plan Note (Signed)
Chronic, stable.  Avoid NSAIDs.  Continue Losartan for kidney protection.  CMP today. 

## 2019-02-01 NOTE — Patient Instructions (Signed)
Rachel Cox , Thank you for taking time to come for your Medicare Wellness Visit. I appreciate your ongoing commitment to your health goals. Please review the following plan we discussed and let me know if I can assist you in the future.   Screening recommendations/referrals: Colonoscopy: completed  Mammogram: please call and schedule with [facility]  Bone Density: please call and schedule with [facility]  Recommended yearly ophthalmology/optometry visit for glaucoma screening and checkup Recommended yearly dental visit for hygiene and checkup  Vaccinations: Influenza vaccine: done today  Pneumococcal vaccine: up to date Tdap vaccine: due, check with your insurance company for coverage  Shingles vaccine: shingrix eligible, check with your insurance for coverage     Advanced directives: Advance directive discussed with you today. I have provided a copy for you to complete at home and have notarized. Once this is complete please bring a copy in to our office so we can scan it into your chart.  Conditions/risks identified: none   Next appointment: Follow up in one year for your annual wellness visit.    Preventive Care 7 Years and Older, Female Preventive care refers to lifestyle choices and visits with your health care provider that can promote health and wellness. What does preventive care include?  A yearly physical exam. This is also called an annual well check.  Dental exams once or twice a year.  Routine eye exams. Ask your health care provider how often you should have your eyes checked.  Personal lifestyle choices, including:  Daily care of your teeth and gums.  Regular physical activity.  Eating a healthy diet.  Avoiding tobacco and drug use.  Limiting alcohol use.  Practicing safe sex.  Taking low-dose aspirin every day.  Taking vitamin and mineral supplements as recommended by your health care provider. What happens during an annual well  check? The services and screenings done by your health care provider during your annual well check will depend on your age, overall health, lifestyle risk factors, and family history of disease. Counseling  Your health care provider may ask you questions about your:  Alcohol use.  Tobacco use.  Drug use.  Emotional well-being.  Home and relationship well-being.  Sexual activity.  Eating habits.  History of falls.  Memory and ability to understand (cognition).  Work and work Statistician.  Reproductive health. Screening  You may have the following tests or measurements:  Height, weight, and BMI.  Blood pressure.  Lipid and cholesterol levels. These may be checked every 5 years, or more frequently if you are over 75 years old.  Skin check.  Lung cancer screening. You may have this screening every year starting at age 68 if you have a 30-pack-year history of smoking and currently smoke or have quit within the past 15 years.  Fecal occult blood test (FOBT) of the stool. You may have this test every year starting at age 29.  Flexible sigmoidoscopy or colonoscopy. You may have a sigmoidoscopy every 5 years or a colonoscopy every 10 years starting at age 45.  Hepatitis C blood test.  Hepatitis B blood test.  Sexually transmitted disease (STD) testing.  Diabetes screening. This is done by checking your blood sugar (glucose) after you have not eaten for a while (fasting). You may have this done every 1-3 years.  Bone density scan. This is done to screen for osteoporosis. You may have this done starting at age 29.  Mammogram. This may be done every 1-2 years. Talk to your health  care provider about how often you should have regular mammograms. Talk with your health care provider about your test results, treatment options, and if necessary, the need for more tests. Vaccines  Your health care provider may recommend certain vaccines, such as:  Influenza vaccine. This is  recommended every year.  Tetanus, diphtheria, and acellular pertussis (Tdap, Td) vaccine. You may need a Td booster every 10 years.  Zoster vaccine. You may need this after age 22.  Pneumococcal 13-valent conjugate (PCV13) vaccine. One dose is recommended after age 10.  Pneumococcal polysaccharide (PPSV23) vaccine. One dose is recommended after age 13. Talk to your health care provider about which screenings and vaccines you need and how often you need them. This information is not intended to replace advice given to you by your health care provider. Make sure you discuss any questions you have with your health care provider. Document Released: 06/16/2015 Document Revised: 02/07/2016 Document Reviewed: 03/21/2015 Elsevier Interactive Patient Education  2017 [place] Prevention in the Home Falls can cause injuries. They can happen to people of all ages. There are many things you can do to make your home safe and to help prevent falls. What can I do on the outside of my home?  Regularly fix the edges of walkways and driveways and fix any cracks.  Remove anything that might make you trip as you walk through a door, such as a raised step or threshold.  Trim any bushes or trees on the path to your home.  Use bright outdoor lighting.  Clear any walking paths of anything that might make someone trip, such as rocks or tools.  Regularly check to see if handrails are loose or broken. Make sure that both sides of any steps have handrails.  Any raised decks and porches should have guardrails on the edges.  Have any leaves, snow, or ice cleared regularly.  Use sand or salt on walking paths during winter.  Clean up any spills in your garage right away. This includes oil or grease spills. What can I do in the bathroom?  Use night lights.  Install grab bars by the toilet and in the tub and shower. Do not use towel bars as grab bars.  Use non-skid mats or decals in the tub or  shower.  If you need to sit down in the shower, use a plastic, non-slip stool.  Keep the floor dry. Clean up any water that spills on the floor as soon as it happens.  Remove soap buildup in the tub or shower regularly.  Attach bath mats securely with double-sided non-slip rug tape.  Do not have throw rugs and other things on the floor that can make you trip. What can I do in the bedroom?  Use night lights.  Make sure that you have a light by your bed that is easy to reach.  Do not use any sheets or blankets that are too big for your bed. They should not hang down onto the floor.  Have a firm chair that has side arms. You can use this for support while you get dressed.  Do not have throw rugs and other things on the floor that can make you trip. What can I do in the kitchen?  Clean up any spills right away.  Avoid walking on wet floors.  Keep items that you use a lot in easy-to-reach places.  If you need to reach something above you, use a strong step stool that has a grab  bar.  Keep electrical cords out of the way.  Do not use floor polish or wax that makes floors slippery. If you must use wax, use non-skid floor wax.  Do not have throw rugs and other things on the floor that can make you trip. What can I do with my stairs?  Do not leave any items on the stairs.  Make sure that there are handrails on both sides of the stairs and use them. Fix handrails that are broken or loose. Make sure that handrails are as long as the stairways.  Check any carpeting to make sure that it is firmly attached to the stairs. Fix any carpet that is loose or worn.  Avoid having throw rugs at the top or bottom of the stairs. If you do have throw rugs, attach them to the floor with carpet tape.  Make sure that you have a light switch at the top of the stairs and the bottom of the stairs. If you do not have them, ask someone to add them for you. What else can I do to help prevent falls?   Wear shoes that:  Do not have high heels.  Have rubber bottoms.  Are comfortable and fit you well.  Are closed at the toe. Do not wear sandals.  If you use a stepladder:  Make sure that it is fully opened. Do not climb a closed stepladder.  Make sure that both sides of the stepladder are locked into place.  Ask someone to hold it for you, if possible.  Clearly mark and make sure that you can see:  Any grab bars or handrails.  First and last steps.  Where the edge of each step is.  Use tools that help you move around (mobility aids) if they are needed. These include:  Canes.  Walkers.  Scooters.  Crutches.  Turn on the lights when you go into a dark area. Replace any light bulbs as soon as they burn out.  Set up your furniture so you have a clear path. Avoid moving your furniture around.  If any of your floors are uneven, fix them.  If there are any pets around you, be aware of where they are.  Review your medicines with your doctor. Some medicines can make you feel dizzy. This can increase your chance of falling. Ask your doctor what other things that you can do to help prevent falls. This information is not intended to replace advice given to you by your health care provider. Make sure you discuss any questions you have with your health care provider. Document Released: 03/16/2009 Document Revised: 10/26/2015 Document Reviewed: 06/24/2014 Elsevier Interactive Patient Education  2017 [employer].

## 2019-02-01 NOTE — Assessment & Plan Note (Signed)
Check CBC, ferritin, iron, and B12 today.  Continue daily supplement and adjust plan of care as needed based on labs. 

## 2019-02-01 NOTE — Patient Instructions (Signed)

## 2019-02-01 NOTE — Progress Notes (Signed)
Subjective:   Rachel Cox is a 68 y.o. female who presents for Medicare Annual (Subsequent) preventive examination.  This visit is being conducted via phone call  - after an attmept to do on video chat - due to the COVID-19 pandemic. This patient has given me verbal consent via phone to conduct this visit, patient states they are participating from their home address. Some vital signs may be absent or patient reported.   Patient identification: identified by name, DOB, and current address.    Review of Systems:   Cardiac Risk Factors include: advanced age (>34men, >45 women);dyslipidemia;hypertension     Objective:     Vitals: BP 132/80 (BP Location: Left Arm, Patient Position: Sitting, Cuff Size: Normal)   Pulse 88   Temp 97.9 F (36.6 C) (Temporal)   Resp 16   Ht 5\' 6"  (1.676 m)   Wt 187 lb 9.6 oz (85.1 kg)   BMI 30.28 kg/m   Body mass index is 30.28 kg/m.  Advanced Directives 02/01/2019 12/12/2017 12/05/2017 11/14/2017 10/29/2017 09/10/2017 08/20/2017  Does Patient Have a Medical Advance Directive? No No No No No Yes;No No  Would patient like information on creating a medical advance directive? Yes (MAU/Ambulatory/Procedural Areas - Information given) No - Patient declined - Yes (MAU/Ambulatory/Procedural Areas - Information given) - No - Patient declined -  Pre-existing out of facility DNR order (yellow form or pink MOST form) - - - - Physician notified to receive inpatient order - -    Tobacco Social History   Tobacco Use  Smoking Status Never Smoker  Smokeless Tobacco Never Used     Counseling given: Not Answered   Clinical Intake:  Pre-visit preparation completed: No  Pain : No/denies pain     Nutritional Status: BMI > 30  Obese Nutritional Risks: None Diabetes: No  How often do you need to have someone help you when you read instructions, pamphlets, or other written materials from your doctor or pharmacy?: 1 - Never  Interpreter Needed?: No   Information entered by ::  ,LPN  Past Medical History:  Diagnosis Date  . Allergic rhinitis   . Allergy   . Anemia   . Anxiety   . Depression   . Epilepsy ([place])    managed by Dr. Rexene Alberts 2015/2016  . GERD (gastroesophageal reflux disease)   . History of abnormal cervical Pap smear 1980's  . Hyperglycemia   . Hyperlipidemia   . Hypertension   . Hypothyroidism   . Insomnia   . Obesity (BMI 30.0-34.9) 01/06/2017  . Osteopenia   . Reflux esophagitis 10/31/2017   EGD   Past Surgical History:  Procedure Laterality Date  . COLONOSCOPY  Sept 2015  . COLONOSCOPY WITH PROPOFOL N/A 09/10/2017   Procedure: COLONOSCOPY WITH PROPOFOL;  Surgeon: Robert Bellow, MD;  Location: [facility] ENDOSCOPY;  Service: Endoscopy;  Laterality: N/A;  . Cryo Procedure  1980s   for abnormal pap  . ESOPHAGOGASTRODUODENOSCOPY (EGD) WITH PROPOFOL N/A 09/10/2017   Procedure: ESOPHAGOGASTRODUODENOSCOPY (EGD) WITH PROPOFOL;  Surgeon: Robert Bellow, MD;  Location: [facility] ENDOSCOPY;  Service: Endoscopy;  Laterality: N/A;  . ESOPHAGOGASTRODUODENOSCOPY (EGD) WITH PROPOFOL N/A 10/29/2017   Procedure: ESOPHAGOGASTRODUODENOSCOPY (EGD) WITH PROPOFOL;  Surgeon: Robert Bellow, MD;  Location: [facility] ENDOSCOPY;  Service: Endoscopy;  Laterality: N/A;  . LIPOMA EXCISION N/A 12/12/2017   Procedure: EXCISION BACK LIPOMA;  Surgeon: Robert Bellow, MD;  Location: [facility] ORS;  Service: General;  Laterality: N/A;  . [place]  Family History  Problem Relation Age of Onset  . Hyperlipidemia Mother   . Hypertension Mother   . CAD Mother   . Thyroid disease Mother   . Heart disease Mother   . COPD Father   . Thyroid disease Father   . Cancer Brother        liver  . Alcohol abuse Brother   . Hypertension Brother   . Liver disease Brother   . Thyroid disease Sister   . Diabetes Neg Hx   . Stroke Neg Hx    Social History   Socioeconomic History  . Marital status: Married    Spouse  name: Girard Cooter  . Number of children: 4  . Years of education: Not on file  . Highest education level: 10th grade  Occupational History  . Occupation: Retired  Scientific laboratory technician  . Financial resource strain: Not hard at all  . Food insecurity    Worry: Never true    Inability: Never true  . Transportation needs    Medical: No    Non-medical: No  Tobacco Use  . Smoking status: Never Smoker  . Smokeless tobacco: Never Used  Substance and Sexual Activity  . Alcohol use: Yes    Comment: wine occ  . Drug use: No  . Sexual activity: Not Currently    Birth control/protection: Post-menopausal  Lifestyle  . Physical activity    Days per week: 0 days    Minutes per session: 0 min  . Stress: Not at all  Relationships  . Social Herbalist on phone: Patient refused    Gets together: Patient refused    Attends religious service: Patient refused    Active member of club or organization: Patient refused    Attends meetings of clubs or organizations: Patient refused    Relationship status: Married  Other Topics Concern  . Not on file  Social History Narrative  . Not on file    Outpatient Encounter Medications as of 02/01/2019  Medication Sig  . atorvastatin (LIPITOR) 40 MG tablet Take 1 tablet (40 mg total) by mouth at bedtime.  . fluticasone (FLONASE) 50 MCG/ACT nasal spray Place 2 sprays into both nostrils daily as needed for allergies.  . folic acid (FOLVITE) A999333 MCG tablet Take 400 mcg by mouth daily.  . hydrochlorothiazide (HYDRODIURIL) 25 MG tablet Take 1 tablet (25 mg total) by mouth daily.  Marland Kitchen levETIRAcetam (KEPPRA) 250 MG tablet Take 250 mg by mouth at bedtime.   Marland Kitchen levothyroxine (SYNTHROID) 75 MCG tablet Take 1 tablet (75 mcg total) by mouth daily. Take one tablet every morning  . losartan (COZAAR) 100 MG tablet Take 1 tablet (100 mg total) by mouth daily.  . mirtazapine (REMERON) 30 MG tablet Take 30 mg by mouth every morning.   . Multiple Vitamin (MULTIVITAMIN) tablet  Take 1 tablet by mouth 3 (three) times a week.   . pantoprazole (PROTONIX) 40 MG tablet Take 1 tablet (40 mg total) by mouth daily.  . vitamin B-12 (CYANOCOBALAMIN) 500 MCG tablet Take 500 mcg by mouth daily.  . [DISCONTINUED] KETOCONAZOLE, TOPICAL, 1 % SHAM Apply 1 application topically 3 (three) times a week. (Patient not taking: Reported on 02/01/2019)  . [DISCONTINUED] Potassium Chloride ER 20 MEQ TBCR Take one tablet tonight, then three times on Thursday, once Friday morning. (Patient not taking: Reported on 02/01/2019)  . [DISCONTINUED] triamcinolone cream (KENALOG) 0.1 % Apply 1 application topically 2 (two) times daily. (Patient not taking: Reported on 02/01/2019)  No facility-administered encounter medications on file as of 02/01/2019.     Activities of Daily Living In your present state of health, do you have any difficulty performing the following activities: 02/01/2019 01/04/2019  Hearing? N N  Comment no hearing aids -  Vision? N N  Comment goes to [facility] when needed -  Difficulty concentrating or making decisions? N N  Walking or climbing stairs? N N  Dressing or bathing? N N  Doing errands, shopping? N N  Preparing Food and eating ? N -  Using the Toilet? N -  In the past six months, have you accidently leaked urine? N -  Do you have problems with loss of bowel control? N -  Managing your Medications? N -  Managing your Finances? N -  Housekeeping or managing your Housekeeping? N -  Some recent data might be hidden    Patient Care Team: Venita Lick, NP as PCP - General (Nurse Practitioner) Anabel Bene, MD as Referring Physician (Neurology) Renato Shin, MD as Consulting Physician (Endocrinology) Bary Castilla Forest Gleason, MD as Consulting Physician (General Surgery)    Assessment:   This is a routine wellness examination for Maylene.  Exercise Activities and Dietary recommendations Current Exercise Habits: The patient does not participate in regular  exercise at present, Exercise limited by: None identified  Goals    . DIET - INCREASE WATER INTAKE     Recommend to drink at least 6-8 8oz glasses of water per day.       Fall Risk: Fall Risk  02/01/2019 01/04/2019 06/01/2018 11/14/2017 07/09/2017  Falls in the past year? 0 0 0 No No  Number falls in past yr: - 0 - - -  Injury with Fall? - 0 - - -  Follow up - Falls evaluation completed - - -    FALL RISK PREVENTION PERTAINING TO THE HOME:  Any stairs in or around the home? Yes  If so, are there any without handrails? No   Home free of loose throw rugs in walkways, pet beds, electrical cords, etc? Yes  Adequate lighting in your home to reduce risk of falls? Yes   ASSISTIVE DEVICES UTILIZED TO PREVENT FALLS:  Life alert? No  Use of a cane, [place] or w/c? No  Grab bars in the bathroom? No  Shower chair or bench in shower? No  Elevated toilet seat or a handicapped toilet? No   DME ORDERS:  DME order needed?  No   TIMED UP AND GO:  Was the test performed? Yes .  Length of time to ambulate 10 feet: 10 sec.   GAIT:  Appearance of gait: Gait steady and fast without the use of an assistive device.  Intervention(s) required? No   DME/home health order needed?  No    Depression Screen PHQ 2/9 Scores 02/01/2019 01/04/2019 07/13/2018 06/01/2018  PHQ - 2 Score 0 0 0 0  PHQ- 9 Score - 0 0 0     Cognitive Function     6CIT Screen 02/01/2019 11/14/2017 11/20/2016  What Year? 0 points 0 points 0 points  What month? 0 points 0 points 0 points  What time? 0 points 0 points 0 points  Count back from 20 0 points 0 points 0 points  Months in reverse 0 points 0 points 2 points  Repeat phrase 0 points 2 points 2 points  Total Score 0 2 4    Immunization History  Administered Date(s) Administered  . Fluad Quad(high Dose 65+)  02/01/2019  . Influenza, High Dose Seasonal PF 03/14/2017, 03/10/2018  . Influenza,inj,Quad PF,6+ Mos 03/02/2015  . Influenza-Unspecified 04/16/2016  .  Pneumococcal Conjugate-13 11/15/2013  . Pneumococcal Polysaccharide-23 07/04/2015  . Td 07/04/1998  . Zoster 06/03/2010    Qualifies for Shingles Vaccine? Yes  Zostavax completed N/A. Due for Shingrix. Education has been provided regarding the importance of this vaccine. Pt has been advised to call insurance company to determine out of pocket expense. Advised may also receive vaccine at local pharmacy or Health Dept. Verbalized acceptance and understanding.  Tdap: Although this vaccine is not a covered service during a Wellness Exam, does the patient still wish to receive this vaccine today?  No .  Education has been provided regarding the importance of this vaccine. Advised may receive this vaccine at local pharmacy or Health Dept. Aware to provide a copy of the vaccination record if obtained from local pharmacy or Health Dept. Verbalized acceptance and understanding.   Flu Vaccine: Due for Flu vaccine. Does the patient want to receive this vaccine today?  Yes .   Pneumococcal Vaccine: up to date   Screening Tests Health Maintenance  Topic Date Due  . DEXA SCAN  01/01/2018  . MAMMOGRAM  07/22/2018  . INFLUENZA VACCINE  01/02/2019  . TETANUS/TDAP  07/14/2019 (Originally 07/04/2008)  . COLONOSCOPY  09/11/2022  . PNA vac Low Risk Adult  Completed  . Hepatitis C Screening  Addressed    Cancer Screenings:  Colorectal Screening: Completed 09/10/2017  Repeat every 5 years  Mammogram: Completed 07/22/2017. Repeat every year; ordered. Patient will call [facility] to schedule   Bone Density: Completed 01/02/2016. Ordered, patient will call [facility] to schedule   Lung Cancer Screening: (Low Dose CT Chest recommended if Age 72-80 years, 30 pack-year currently smoking OR have quit w/in 15years.) does not qualify.     Additional Screening:  Hepatitis C Screening: does qualify; Completed 11/15/2013  Vision Screening: Recommended annual ophthalmology exams for early detection  of glaucoma and other disorders of the eye. Is the patient up to date with their annual eye exam?  Yes  Who is the provider or what is the name of the office in which the pt attends annual eye exams? [facility]    Dental Screening: Recommended annual dental exams for proper oral hygiene  Community Resource Referral:  CRR required this visit?  No       Plan:  I have personally reviewed and addressed the Medicare Annual Wellness questionnaire and have noted the following in the patient's chart:  A. Medical and social history B. Use of alcohol, tobacco or illicit drugs  C. Current medications and supplements D. Functional ability and status E.  Nutritional status F.  Physical activity G. Advance directives H. List of other physicians I.  Hospitalizations, surgeries, and ER visits in previous 12 months J.  [place] such as hearing and vision if needed, cognitive and depression L. Referrals and appointments   In addition, I have reviewed and discussed with patient certain preventive protocols, quality metrics, and best practice recommendations. A written personalized care plan for preventive services as well as general preventive health recommendations were provided to patient.  Signed,    Bevelyn Ngo, LPN  [id number] Nurse Health Advisor   Nurse Notes: none

## 2019-02-01 NOTE — Progress Notes (Signed)
BP 132/80   Pulse 88   Temp 97.9 F (36.6 C) (Temporal)   Ht 5\' 6"  (1.676 m)   Wt 187 lb 9.6 oz (85.1 kg)   BMI 30.28 kg/m    Subjective:    Patient ID: Rachel Cox, female    DOB: [birth year], 68 y.o.   MRN: [id number]  HPI: Rachel Cox is a 68 y.o. female  Chief Complaint  Patient presents with  . Hypertension    4 week f/up    HYPERTENSION WITH CKD Continues on Losartan & HCTZ. Hypertension status: stable  Satisfied with current treatment? yes Duration of hypertension: chronic BP monitoring frequency:  rarely BP range: 120-130/70-80 BP medication side effects:  no Medication compliance: good compliance Aspirin: no Recurrent headaches: no Visual changes: no Palpitations: no Dyspnea: no Chest pain: no Lower extremity edema: no Dizzy/lightheaded: no   HYPERLIPIDEMIA Continues on Atorvastatin. Hyperlipidemia status: good compliance Satisfied with current treatment?  yes Side effects:  no Medication compliance: good compliance Past cholesterol meds: Atorvastatin Supplements: none Aspirin:  no The 10-year ASCVD risk score Mikey Bussing DC Jr., et al., 2013) is: 12.5%   Values used to calculate the score:     Age: 23 years     Sex: Female     Is Non-Hispanic African American: No     Diabetic: No     Tobacco smoker: No     Systolic Blood Pressure: [id number] mmHg     Is BP treated: Yes     HDL Cholesterol: 78 mg/dL     Total Cholesterol: 201 mg/dL Chest pain:  no Coronary artery disease:  no Family history CAD:  no Family history early CAD:  no   ANEMIA In February ferritin level 10, iron 110, H/H 14.4/43.6, MCV 88.4.  Takes B12 and multivitamin daily at this time. Anemia status: stable Etiology of anemia: Duration of anemia treatment:  Compliance with treatment: good compliance Iron supplementation side effects: no Severity of anemia: mild Fatigue: no Decreased exercise tolerance: no  Dyspnea on exertion: no Palpitations: no Bleeding: no Pica: no    VITAMIN D DEFICIENCY: Currently takes a supplement 3-4 times a week. In February 2019 level 38.  No recent falls or fractures.  Relevant past medical, surgical, family and social history reviewed and updated as indicated. Interim medical history since our last visit reviewed. Allergies and medications reviewed and updated.  Review of Systems  Constitutional: Negative for activity change, appetite change, diaphoresis, fatigue and fever.  Respiratory: Negative for cough, chest tightness and shortness of breath.   Cardiovascular: Negative for chest pain, palpitations and leg swelling.  Gastrointestinal: Negative for abdominal distention, abdominal pain, constipation, diarrhea, nausea and vomiting.  Neurological: Negative for dizziness, syncope, weakness, light-headedness, numbness and headaches.  Psychiatric/Behavioral: Negative.     Per HPI unless specifically indicated above     Objective:    BP 132/80   Pulse 88   Temp 97.9 F (36.6 C) (Temporal)   Ht 5\' 6"  (1.676 m)   Wt 187 lb 9.6 oz (85.1 kg)   BMI 30.28 kg/m   Wt Readings from Last 3 Encounters:  02/01/19 187 lb 9.6 oz (85.1 kg)  02/01/19 187 lb 9.6 oz (85.1 kg)  11/03/18 183 lb 9.6 oz (83.3 kg)    Physical Exam Vitals signs and nursing note reviewed.  Constitutional:      General: She is awake. She is not in acute distress.    Appearance: She is well-developed. She is not ill-appearing.  HENT:  Head: Normocephalic.     Right Ear: Hearing normal.     Left Ear: Hearing normal.  Eyes:     General: Lids are normal.        Right eye: No discharge.        Left eye: No discharge.     Conjunctiva/sclera: Conjunctivae normal.     Pupils: Pupils are equal, round, and reactive to light.  Neck:     Musculoskeletal: Normal range of motion and neck supple.     Thyroid: No thyromegaly.     Vascular: No carotid bruit or JVD.  Cardiovascular:     Rate and Rhythm: Normal rate and regular rhythm.     Heart sounds:  Normal heart sounds. No murmur. No gallop.   Pulmonary:     Effort: Pulmonary effort is normal. No accessory muscle usage or respiratory distress.     Breath sounds: Normal breath sounds.  Abdominal:     General: Bowel sounds are normal.     Palpations: Abdomen is soft.  Musculoskeletal:     Right lower leg: No edema.     Left lower leg: No edema.  Lymphadenopathy:     Cervical: No cervical adenopathy.  Skin:    General: Skin is warm and dry.  Neurological:     Mental Status: She is alert and oriented to person, place, and time.  Psychiatric:        Attention and Perception: Attention normal.        Mood and Affect: Mood normal.        Behavior: Behavior normal. Behavior is cooperative.        Thought Content: Thought content normal.        Judgment: Judgment normal.     Results for orders placed or performed in visit on 11/03/18  TSH  Result Value Ref Range   TSH 1.71 0.35 - 4.50 uIU/mL  T4, free  Result Value Ref Range   Free T4 1.14 0.60 - 1.60 ng/dL      Assessment & Plan:   Problem List Items Addressed This Visit      Cardiovascular and Mediastinum   Hypertension (Chronic)    Chronic, ongoing with BP at goal today and on occasional home readings.  Recommend continuing to check BP at home a few days a week.  Continue current medication regimen.  Obtain CMP today.  Return in 6 months.      Relevant Orders   Comprehensive metabolic panel     Genitourinary   CKD (chronic kidney disease) stage 3, GFR 30-59 ml/min (HCC) - Primary (Chronic)    Chronic, stable.  Avoid NSAIDs.  Continue Losartan for kidney protection.  CMP today.        Other   Hyperlipidemia (Chronic)    Chronic, ongoing.  Continue current medication regimen and adjust as needed.  Lipid panel and CMP today.      Relevant Orders   Lipid Panel w/o Chol/HDL Ratio   Comprehensive metabolic panel   Vitamin D deficiency    Check Vitamin D today and consider daily supplement if low.      Relevant  Orders   VITAMIN D 25 Hydroxy (Vit-D Deficiency, Fractures)   Iron deficiency anemia    Check CBC, ferritin, iron, and B12 today.  Continue daily supplement and adjust plan of care as needed based on labs.      Relevant Orders   Vitamin B12   CBC with Differential/Platelet   Iron, TIBC and Ferritin Panel  Follow up plan: Return in about 6 months (around 08/01/2019) for HTN/HLD, anemia, hypothyroid, depression/anxiety, CKD.

## 2019-02-01 NOTE — Telephone Encounter (Signed)
Called pt's husband and rescheduled appt, Darnell mentioned that she had an appt this morning and the medicine was not called into the pharmacy. Please advise.

## 2019-02-01 NOTE — Telephone Encounter (Signed)
I have sent in Vitamin D tablet, please let me know if she needs anything else.  Tell her husband and her thank you for understanding the need to reschedule.  I appreciate it and wish to keep my patients safe. :)

## 2019-02-01 NOTE — Assessment & Plan Note (Signed)
Check Vitamin D today and consider daily supplement if low.

## 2019-02-01 NOTE — Telephone Encounter (Signed)
[facility] called from [facility] to request a bone density test. Call back [phone]

## 2019-02-01 NOTE — Assessment & Plan Note (Signed)
Chronic, ongoing with BP at goal today and on occasional home readings.  Recommend continuing to check BP at home a few days a week.  Continue current medication regimen.  Obtain CMP today.  Return in 6 months.

## 2019-02-01 NOTE — Telephone Encounter (Signed)
You sure can give verbal.  Diagnosis post menopausal estrogen deficiency.

## 2019-02-01 NOTE — Telephone Encounter (Signed)
Written ordered needed, written up and faxed.

## 2019-02-02 LAB — CBC WITH DIFFERENTIAL/PLATELET
Basophils Absolute: 0 10*3/uL (ref 0.0–0.2)
Basos: 1 %
EOS (ABSOLUTE): 0.1 10*3/uL (ref 0.0–0.4)
Eos: 3 %
Hematocrit: 43.2 % (ref 34.0–46.6)
Hemoglobin: 14.3 g/dL (ref 11.1–15.9)
Immature Grans (Abs): 0 10*3/uL (ref 0.0–0.1)
Immature Granulocytes: 0 %
Lymphocytes Absolute: 1.9 10*3/uL (ref 0.7–3.1)
Lymphs: 36 %
MCH: 29.2 pg (ref 26.6–33.0)
MCHC: 33.1 g/dL (ref 31.5–35.7)
MCV: 88 fL (ref 79–97)
Monocytes Absolute: 0.7 10*3/uL (ref 0.1–0.9)
Monocytes: 13 %
Neutrophils Absolute: 2.5 10*3/uL (ref 1.4–7.0)
Neutrophils: 47 %
Platelets: 293 10*3/uL (ref 150–450)
RBC: 4.9 x10E6/uL (ref 3.77–5.28)
RDW: 13.9 % (ref 11.7–15.4)
WBC: 5.2 10*3/uL (ref 3.4–10.8)

## 2019-02-02 LAB — LIPID PANEL W/O CHOL/HDL RATIO
Cholesterol, Total: 184 mg/dL (ref 100–199)
HDL: 83 mg/dL (ref >39)
LDL Chol Calc (NIH): 84 mg/dL (ref 0–99)
Triglycerides: 98 mg/dL (ref 0–149)
VLDL Cholesterol Cal: 17 mg/dL (ref 5–40)

## 2019-02-02 LAB — COMPREHENSIVE METABOLIC PANEL
ALT: 18 IU/L (ref 0–32)
AST: 21 IU/L (ref 0–40)
Albumin/Globulin Ratio: 1.8 (ref 1.2–2.2)
Albumin: 4.4 g/dL (ref 3.8–4.8)
Alkaline Phosphatase: 100 IU/L (ref 39–117)
BUN/Creatinine Ratio: 16 (ref 12–28)
BUN: 16 mg/dL (ref 8–27)
Bilirubin Total: 0.6 mg/dL (ref 0.0–1.2)
CO2: 26 mmol/L (ref 20–29)
Calcium: 10 mg/dL (ref 8.7–10.3)
Chloride: 101 mmol/L (ref 96–106)
Creatinine, Ser: 0.97 mg/dL (ref 0.57–1.00)
GFR calc Af Amer: 68 mL/min/{1.73_m2} (ref >59)
GFR calc non Af Amer: 59 mL/min/{1.73_m2} — ABNORMAL LOW (ref >59)
Globulin, Total: 2.4 g/dL (ref 1.5–4.5)
Glucose: 92 mg/dL (ref 65–99)
Potassium: 3.6 mmol/L (ref 3.5–5.2)
Sodium: 142 mmol/L (ref 134–144)
Total Protein: 6.8 g/dL (ref 6.0–8.5)

## 2019-02-02 LAB — IRON,TIBC AND FERRITIN PANEL
Ferritin: 13 ng/mL — ABNORMAL LOW (ref 15–150)
Iron Saturation: 19 % (ref 15–55)
Iron: 69 ug/dL (ref 27–139)
Total Iron Binding Capacity: 356 ug/dL (ref 250–450)
UIBC: 287 ug/dL (ref 118–369)

## 2019-02-02 LAB — VITAMIN D 25 HYDROXY (VIT D DEFICIENCY, FRACTURES): Vit D, 25-Hydroxy: 30 ng/mL (ref 30.0–100.0)

## 2019-02-02 LAB — VITAMIN B12: Vitamin B-12: 883 pg/mL (ref 232–1245)

## 2019-02-16 DIAGNOSIS — M85852 Other specified disorders of bone density and structure, left thigh: Secondary | ICD-10-CM | POA: Diagnosis not present

## 2019-02-16 DIAGNOSIS — E2839 Other primary ovarian failure: Secondary | ICD-10-CM | POA: Diagnosis not present

## 2019-02-16 DIAGNOSIS — M85862 Other specified disorders of bone density and structure, left lower leg: Secondary | ICD-10-CM | POA: Diagnosis not present

## 2019-02-16 DIAGNOSIS — Z780 Asymptomatic menopausal state: Secondary | ICD-10-CM | POA: Diagnosis not present

## 2019-02-16 DIAGNOSIS — Z1231 Encounter for screening mammogram for malignant neoplasm of breast: Secondary | ICD-10-CM | POA: Diagnosis not present

## 2019-04-22 IMAGING — US US SOFT TISSUE HEAD/NECK
1 series · 12 of 12 positions shown · non-contrast
Comparison: None.

CLINICAL DATA: Palpable mass along posterior lower neck.

EXAM:
ULTRASOUND OF HEAD/NECK SOFT TISSUES
TECHNIQUE: Ultrasound examination of the head and neck soft tissues was
performed in the area of clinical concern.

[Series 1: us soft tissue head/neck · 0.12mm/px · 12 of 12 slices shown]
[im 1/12]
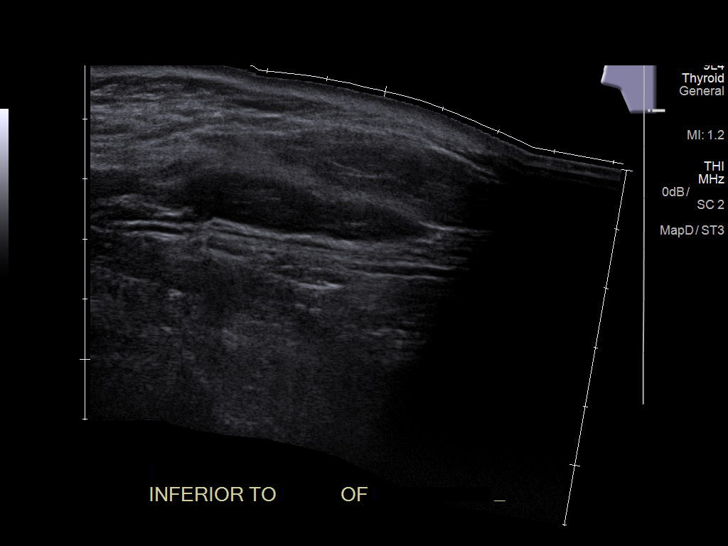
[im 2/12]
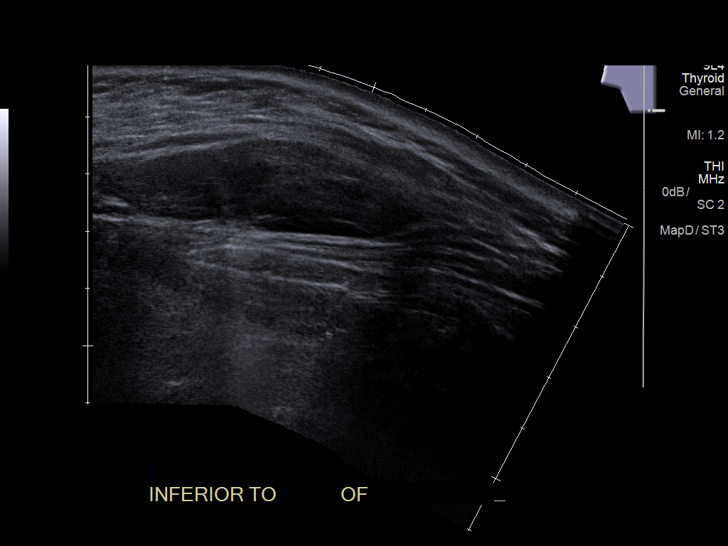
[im 3/12]
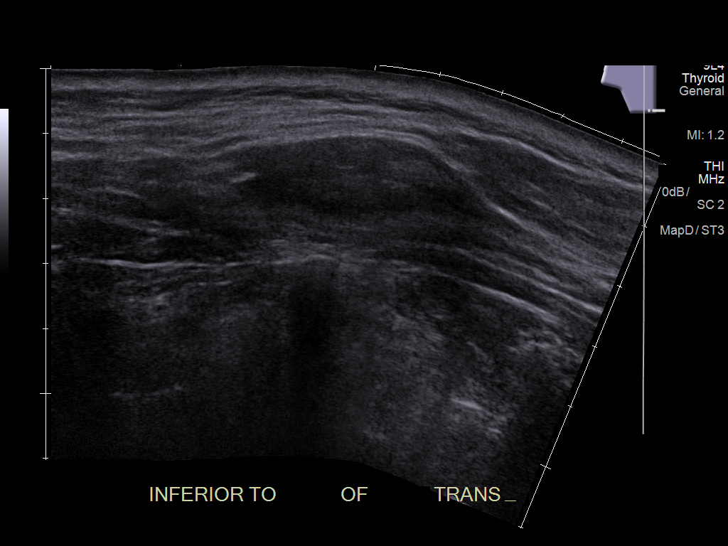
[im 4/12]
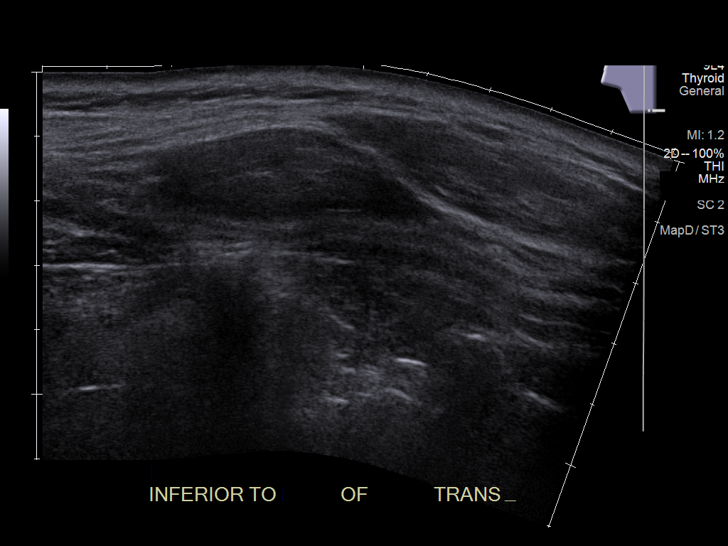
[im 5/12]
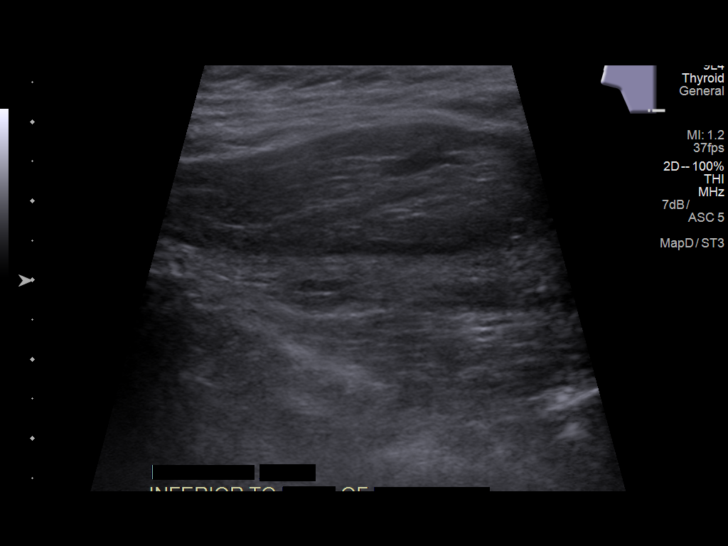
[im 6/12]
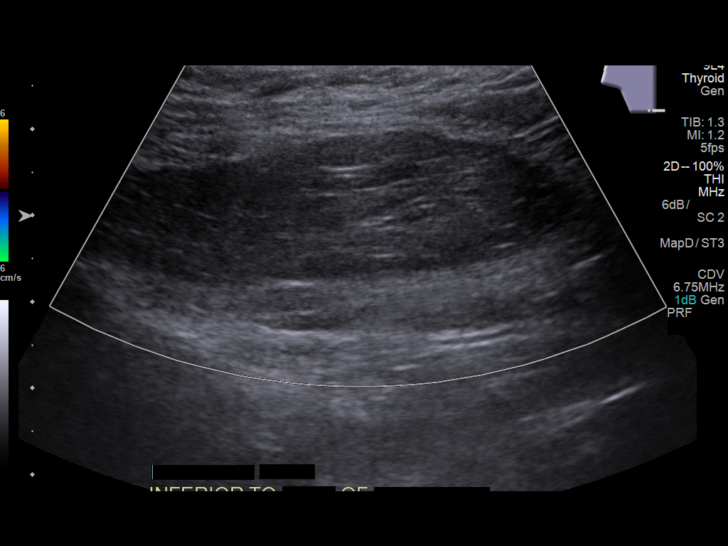
[im 7/12]
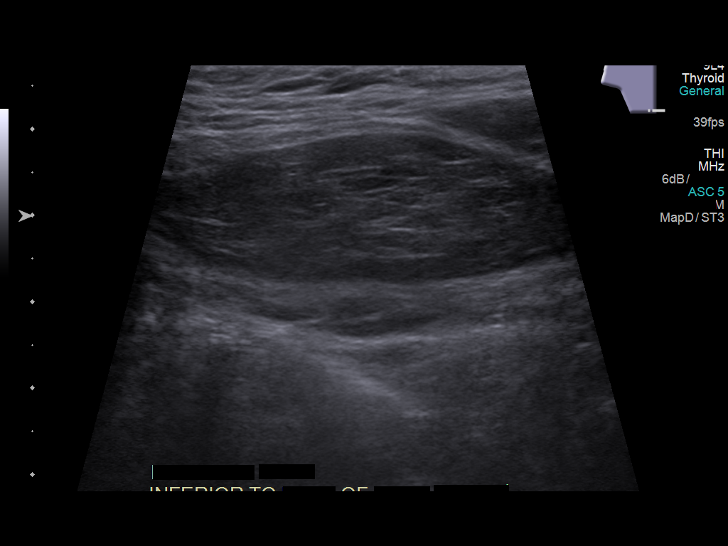
[im 8/12]
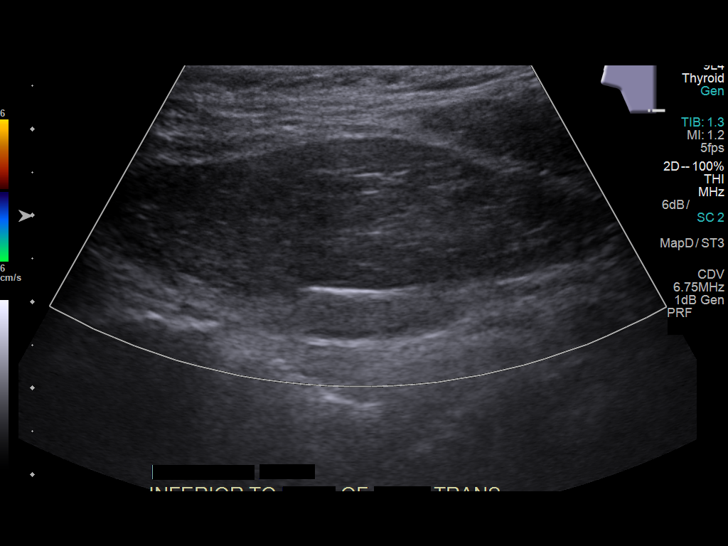
[im 9/12]
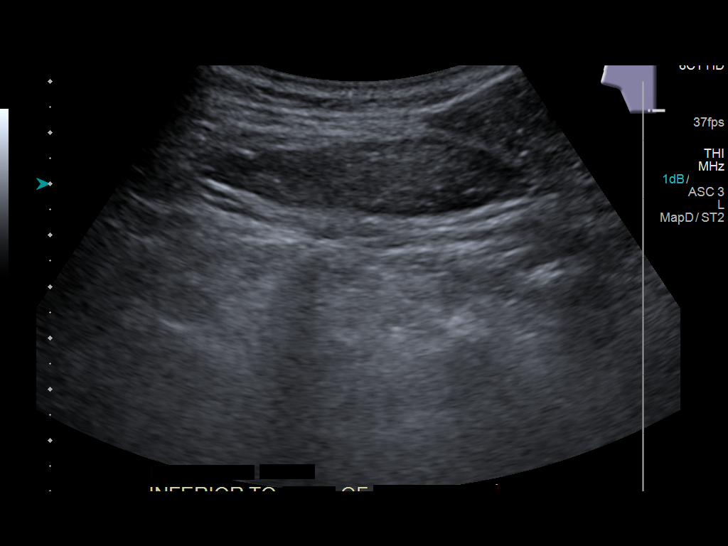
[im 10/12]
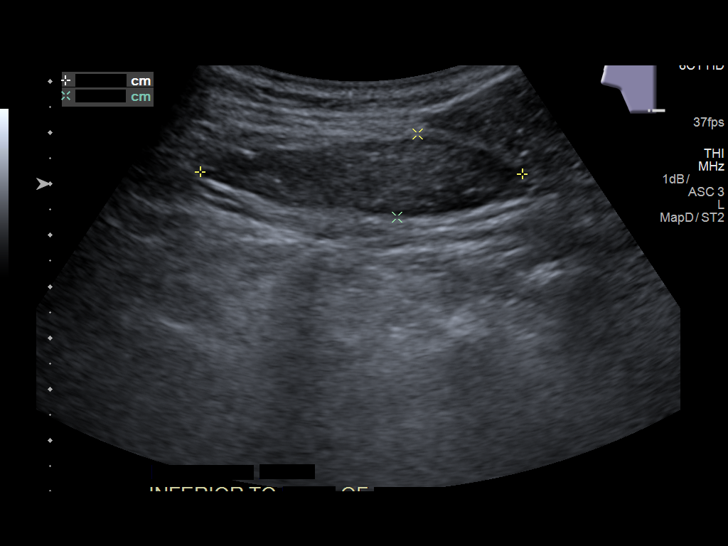
[im 11/12]
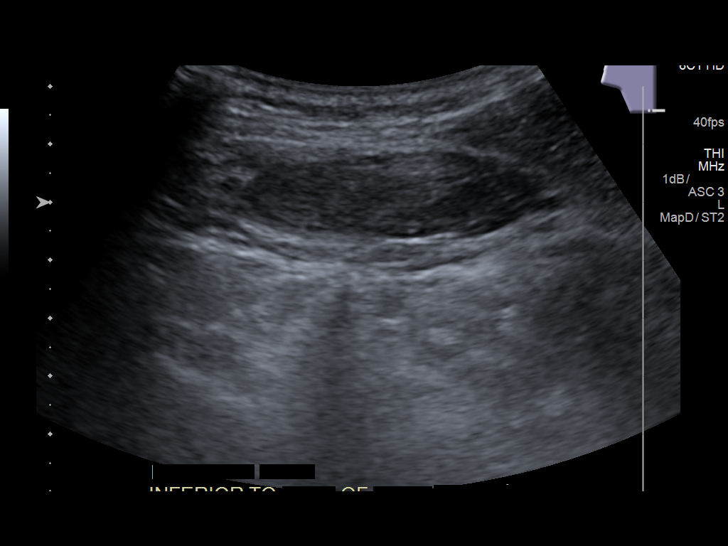
[im 12/12]
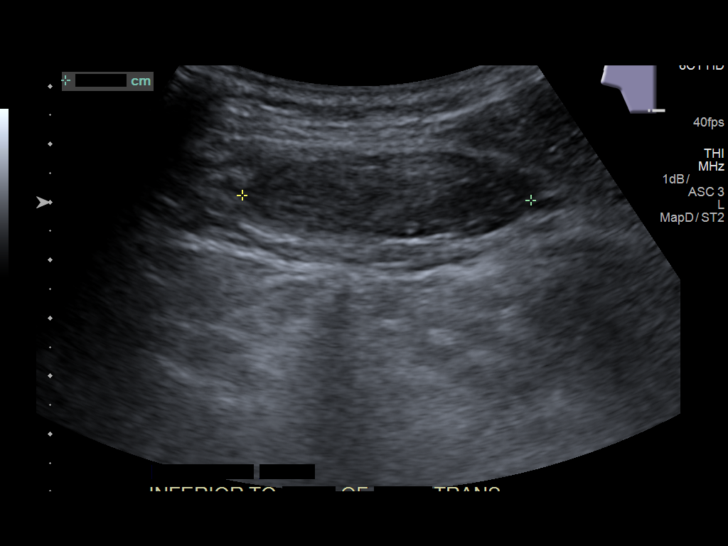

[12 of 12 positions shown; findings below may reference images not displayed]

FINDINGS: In the region of palpable abnormality, a well-circumscribed ovoid
hypoechoic soft tissue abnormality is identified in the subcutaneous
soft tissues measuring roughly 6.3 x 1.7 x 5.0 cm. Based on
appearance, this is statistically most likely felt to represent a
lipoma. However, soft tissue malignancy cannot be excluded by
ultrasound. Further clinical assessment recommended to evaluate for
growth. CT of the neck with IV contrast may be benefit if there is
concern for malignancy.
IMPRESSION: Well-circumscribed hypoechoic soft tissue mass in the posterior
lower neck measuring 6.3 x 1.7 x 5.0 cm by ultrasound. This is felt
to most likely represent a lipoma. However, soft tissue malignancy
cannot be excluded by ultrasound. CT of the neck with IV contrast
may be of benefit if there is concern for malignancy based on
growth.

## 2019-06-04 HISTORY — PX: EYE SURGERY: SHX253

## 2019-07-12 ENCOUNTER — Telehealth: Payer: Self-pay | Admitting: Nurse Practitioner

## 2019-07-12 NOTE — Chronic Care Management (AMB) (Signed)
  Chronic Care Management   Note  07/12/2019 Name: PAELYN SMICK MRN: [id number] DOB: [birth year]  Hurley Cisco is a 69 y.o. year old female who is a primary care patient of Cannady, Barbaraann Faster, NP. I reached out to Hurley Cisco by phone today in response to a referral sent by Ms. Jacolyn Reedy [facility] health plan.     Ms. Ord was given information about Chronic Care Management services today including:  1. CCM service includes personalized support from designated clinical staff supervised by her physician, including individualized plan of care and coordination with other care providers 2. 24/7 contact phone numbers for assistance for urgent and routine care needs. 3. Service will only be billed when office clinical staff spend 20 minutes or more in a month to coordinate care. 4. Only one practitioner may furnish and bill the service in a calendar month. 5. The patient may stop CCM services at any time (effective at the end of the month) by phone call to the office staff. 6. The patient will be responsible for cost sharing (co-pay) of up to 20% of the service fee (after annual deductible is met).  Patient agreed to services and verbal consent obtained.   Follow up plan: Telephone appointment with care management team member scheduled for:09/01/2019  Noreene Larsson, [address], [place], [place] [address] Direct Dial: [phone] [email or web address] Website: [email or web address]

## 2019-07-19 DIAGNOSIS — I10 Essential (primary) hypertension: Secondary | ICD-10-CM | POA: Diagnosis not present

## 2019-07-19 DIAGNOSIS — R569 Unspecified convulsions: Secondary | ICD-10-CM | POA: Diagnosis not present

## 2019-08-02 ENCOUNTER — Ambulatory Visit (INDEPENDENT_AMBULATORY_CARE_PROVIDER_SITE_OTHER): Payer: PPO | Admitting: Nurse Practitioner

## 2019-08-02 ENCOUNTER — Encounter: Payer: Self-pay | Admitting: Nurse Practitioner

## 2019-08-02 ENCOUNTER — Other Ambulatory Visit: Payer: Self-pay

## 2019-08-02 VITALS — BP 127/82 | HR 83 | Temp 98.0°F

## 2019-08-02 DIAGNOSIS — F3341 Major depressive disorder, recurrent, in partial remission: Secondary | ICD-10-CM

## 2019-08-02 DIAGNOSIS — E538 Deficiency of other specified B group vitamins: Secondary | ICD-10-CM | POA: Diagnosis not present

## 2019-08-02 DIAGNOSIS — G40802 Other epilepsy, not intractable, without status epilepticus: Secondary | ICD-10-CM

## 2019-08-02 DIAGNOSIS — E039 Hypothyroidism, unspecified: Secondary | ICD-10-CM | POA: Diagnosis not present

## 2019-08-02 DIAGNOSIS — A389 Scarlet fever, uncomplicated: Secondary | ICD-10-CM | POA: Insufficient documentation

## 2019-08-02 DIAGNOSIS — Z8619 Personal history of other infectious and parasitic diseases: Secondary | ICD-10-CM | POA: Insufficient documentation

## 2019-08-02 DIAGNOSIS — D508 Other iron deficiency anemias: Secondary | ICD-10-CM | POA: Diagnosis not present

## 2019-08-02 DIAGNOSIS — E611 Iron deficiency: Secondary | ICD-10-CM | POA: Diagnosis not present

## 2019-08-02 DIAGNOSIS — I10 Essential (primary) hypertension: Secondary | ICD-10-CM | POA: Diagnosis not present

## 2019-08-02 DIAGNOSIS — E559 Vitamin D deficiency, unspecified: Secondary | ICD-10-CM | POA: Diagnosis not present

## 2019-08-02 DIAGNOSIS — E782 Mixed hyperlipidemia: Secondary | ICD-10-CM | POA: Diagnosis not present

## 2019-08-02 DIAGNOSIS — F419 Anxiety disorder, unspecified: Secondary | ICD-10-CM | POA: Diagnosis not present

## 2019-08-02 DIAGNOSIS — N1831 Chronic kidney disease, stage 3a: Secondary | ICD-10-CM | POA: Diagnosis not present

## 2019-08-02 NOTE — Assessment & Plan Note (Signed)
Chronic, ongoing.  Continue daily supplement and adjust as needed.  Last DEXA 2020.  Check Vit D level today.

## 2019-08-02 NOTE — Assessment & Plan Note (Signed)
Chronic, stable.  Continue current medication regimen, Remeron, and adjust as needed.  May consider Buspar for anxiety.  Avoid benzo due to age.   

## 2019-08-02 NOTE — Assessment & Plan Note (Signed)
Chronic, ongoing with BP at goal today and on occasional home readings.  Recommend continuing to check BP at home a few days a week.  Continue current medication regimen and adjust as needed.  Obtain BMP today.  Return in 6 months for annual physical.

## 2019-08-02 NOTE — Assessment & Plan Note (Signed)
Chronic, stable.  Avoid NSAIDs.  Continue Losartan for kidney protection.  BMP today.

## 2019-08-02 NOTE — Assessment & Plan Note (Signed)
Chronic, stable.  Continue current medication regimen as recommended by neuro.  She reports they do not check Keppra level, states they report no need to.

## 2019-08-02 NOTE — Assessment & Plan Note (Signed)
Chronic, ongoing.  Continue daily supplement and adjust as needed.  Check B12 level today. 

## 2019-08-02 NOTE — Patient Instructions (Signed)
Hypothyroidism  Hypothyroidism is when the thyroid gland does not make enough of certain hormones (it is underactive). The thyroid gland is a small gland located in the lower front part of the neck, just in front of the windpipe (trachea). This gland makes hormones that help control how the body uses food for energy (metabolism) as well as how the heart and brain function. These hormones also play a role in keeping your bones strong. When the thyroid is underactive, it produces too little of the hormones thyroxine (T4) and triiodothyronine (T3). What are the causes? This condition may be caused by:  Hashimoto's disease. This is a disease in which the body's disease-fighting system (immune system) attacks the thyroid gland. This is the most common cause.  Viral infections.  Pregnancy.  Certain medicines.  Birth defects.  Past radiation treatments to the head or neck for cancer.  Past treatment with radioactive iodine.  Past exposure to radiation in the environment.  Past surgical removal of part or all of the thyroid.  Problems with a gland in the center of the brain (pituitary gland).  Lack of enough iodine in the diet. What increases the risk? You are more likely to develop this condition if:  You are female.  You have a family history of thyroid conditions.  You use a medicine called lithium.  You take medicines that affect the immune system (immunosuppressants). What are the signs or symptoms? Symptoms of this condition include:  Feeling as though you have no energy (lethargy).  Not being able to tolerate cold.  Weight gain that is not explained by a change in diet or exercise habits.  Lack of appetite.  Dry skin.  Coarse hair.  Menstrual irregularity.  Slowing of thought processes.  Constipation.  Sadness or depression. How is this diagnosed? This condition may be diagnosed based on:  Your symptoms, your medical history, and a physical exam.  Blood  tests. You may also have imaging tests, such as an ultrasound or MRI. How is this treated? This condition is treated with medicine that replaces the thyroid hormones that your body does not make. After you begin treatment, it may take several weeks for symptoms to go away. Follow these instructions at home:  Take over-the-counter and prescription medicines only as told by your health care provider.  If you start taking any new medicines, tell your health care provider.  Keep all follow-up visits as told by your health care provider. This is important. ? As your condition improves, your dosage of thyroid hormone medicine may change. ? You will need to have blood tests regularly so that your health care provider can monitor your condition. Contact a health care provider if:  Your symptoms do not get better with treatment.  You are taking thyroid replacement medicine and you: ? Sweat a lot. ? Have tremors. ? Feel anxious. ? Lose weight rapidly. ? Cannot tolerate heat. ? Have emotional swings. ? Have diarrhea. ? Feel weak. Get help right away if you have:  Chest pain.  An irregular heartbeat.  A rapid heartbeat.  Difficulty breathing. Summary  Hypothyroidism is when the thyroid gland does not make enough of certain hormones (it is underactive).  When the thyroid is underactive, it produces too little of the hormones thyroxine (T4) and triiodothyronine (T3).  The most common cause is Hashimoto's disease, a disease in which the body's disease-fighting system (immune system) attacks the thyroid gland. The condition can also be caused by viral infections, medicine, pregnancy, or past   radiation treatment to the head or neck.  Symptoms may include weight gain, dry skin, constipation, feeling as though you do not have energy, and not being able to tolerate cold.  This condition is treated with medicine to replace the thyroid hormones that your body does not make. This information  is not intended to replace advice given to you by your health care provider. Make sure you discuss any questions you have with your health care provider. Document Revised: 05/02/2017 Document Reviewed: 04/30/2017 Elsevier Patient Education  2020 [employer]  

## 2019-08-02 NOTE — Assessment & Plan Note (Signed)
Chronic, ongoing.  Continue current medication regimen as recommended by Dr. Ellis and collaboration with endocrinology.   ?

## 2019-08-02 NOTE — Assessment & Plan Note (Addendum)
Chronic, ongoing.  Continue current medication regimen, Remeron, and adjust as needed.  Denies SI/HI.  Return in 6 months for annual physical.

## 2019-08-02 NOTE — Progress Notes (Signed)
BP 127/82 (BP Location: Left Arm, Patient Position: Sitting, Cuff Size: Normal)   Pulse 83   Temp 98 F (36.7 C) (Oral)   SpO2 99%    Subjective:    Patient ID: Rachel Cox, female    DOB: December 13, 1950, 69 y.o.   MRN: [id number]  HPI: Rachel Cox is a 69 y.o. female  Chief Complaint  Patient presents with  . Hypertension  . Hypothyroidism  . Hyperlipidemia  . Anemia  . Depression  . Anxiety  . Chronic Kidney Disease   HYPERTENSION / HYPERLIPIDEMIA Continues on Losartan, HCTZ and Atorvastatin.   Satisfied with current treatment? yes Duration of hypertension: chronic BP monitoring frequency: a few times a week BP range: 120-130/70-80 BP medication side effects: yes Duration of hyperlipidemia: chronic Cholesterol medication side effects: no Cholesterol supplements: none Medication compliance: good compliance Aspirin: no Recent stressors: no Recurrent headaches: no Visual changes: no Palpitations: no Dyspnea: no Chest pain: no Lower extremity edema: no Dizzy/lightheaded: no   ANEMIA In February ferritin level 13, iron 69, H/H 14.3/43.2, MCV 88, B12 883.  Takes B12 and multivitamin daily at this time. Anemia status: stable Etiology of anemia: Duration of anemia treatment:  Compliance with treatment: good compliance Iron supplementation side effects: no Severity of anemia: mild Fatigue: no Decreased exercise tolerance: no  Dyspnea on exertion: no Palpitations: no Bleeding: no Pica: no   CHRONIC KIDNEY DISEASE Last GFR 59 and CRT 0.97 -- August 2020. CKD status: stable Medications renally dose: yes Previous renal evaluation: no Pneumovax:  Up to Date Influenza Vaccine:  Up to Date   HYPOTHYROIDISM Last TSH 1.71 and T4 1.14 -- followed by Dr. Lissa Merlin. Thyroid control status:stable Satisfied with current treatment? yes Medication side effects: no Medication compliance: good compliance Etiology of hypothyroidism:  Recent dose adjustment:no Fatigue:  no Cold intolerance: no Heat intolerance: no Weight gain: no Weight loss: no Constipation: no Diarrhea/loose stools: no Palpitations: no Lower extremity edema: no Anxiety/depressed mood: no   ANXIETY/STRESS Continues on Remeron 30 MG for mood.  Duration:stable Anxious mood: no  Excessive worrying: no Irritability: no  Sweating: no Nausea: no Palpitations:no Hyperventilation: no Panic attacks: no Agoraphobia: no  Obscessions/compulsions: no Depressed mood: no Depression screen [facility] 2/9 08/02/2019 02/01/2019 01/04/2019 07/13/2018 06/01/2018  Decreased Interest 1 0 0 0 0  Down, Depressed, Hopeless 1 0 0 0 0  PHQ - 2 Score 2 0 0 0 0  Altered sleeping 1 - 0 0 0  Tired, decreased energy 1 - 0 0 0  Change in appetite 0 - 0 0 0  Feeling bad or failure about yourself  0 - 0 0 0  Trouble concentrating 0 - 0 0 0  Moving slowly or fidgety/restless 0 - 0 0 0  Suicidal thoughts 0 - 0 0 0  PHQ-9 Score 4 - 0 0 0  Difficult doing work/chores Not difficult at all - Not difficult at all Not difficult at all Not difficult at all   Anhedonia: no Weight changes: no Insomnia: none Hypersomnia: no Fatigue/loss of energy: no Feelings of worthlessness: no Feelings of guilt: no Impaired concentration/indecisiveness: no Suicidal ideations: no  Crying spells: no Recent Stressors/Life Changes: no   Relationship problems: no   Family stress: no     Financial stress: no    Job stress: no    Recent death/loss: no GAD 7 : Generalized Anxiety Score 08/02/2019  Nervous, Anxious, on Edge 1  Control/stop worrying 0  Worry too much - different things  0  Trouble relaxing 0  Restless 0  Easily annoyed or irritable 1  Afraid - awful might happen 0  Total GAD 7 Score 2  Anxiety Difficulty Not difficult at all   SEIZURE DISORDER Followed by Dr. Melrose Nakayama, recently saw on Feb 15th.  Has had no seizure since 2016. Keppra 250 MG at bedtime  Relevant past medical, surgical, family and social history  reviewed and updated as indicated. Interim medical history since our last visit reviewed. Allergies and medications reviewed and updated.  Review of Systems  Constitutional: Negative for activity change, appetite change, diaphoresis, fatigue and fever.  Respiratory: Negative for cough, chest tightness, shortness of breath and wheezing.   Cardiovascular: Negative for chest pain, palpitations and leg swelling.  Gastrointestinal: Negative.   Endocrine: Negative for cold intolerance and heat intolerance.  Neurological: Negative.   Psychiatric/Behavioral: Negative.     Per HPI unless specifically indicated above     Objective:    BP 127/82 (BP Location: Left Arm, Patient Position: Sitting, Cuff Size: Normal)   Pulse 83   Temp 98 F (36.7 C) (Oral)   SpO2 99%   Wt Readings from Last 3 Encounters:  02/01/19 187 lb 9.6 oz (85.1 kg)  02/01/19 187 lb 9.6 oz (85.1 kg)  11/03/18 183 lb 9.6 oz (83.3 kg)    Physical Exam Vitals and nursing note reviewed.  Constitutional:      General: She is awake. She is not in acute distress.    Appearance: She is well-developed and overweight. She is not ill-appearing.  HENT:     Head: Normocephalic.     Right Ear: Hearing normal.     Left Ear: Hearing normal.  Eyes:     General: Lids are normal.        Right eye: No discharge.        Left eye: No discharge.     Conjunctiva/sclera: Conjunctivae normal.     Pupils: Pupils are equal, round, and reactive to light.  Neck:     Thyroid: No thyromegaly.     Vascular: No carotid bruit.  Cardiovascular:     Rate and Rhythm: Normal rate and regular rhythm.     Heart sounds: Normal heart sounds. No murmur. No gallop.   Pulmonary:     Effort: Pulmonary effort is normal. No accessory muscle usage or respiratory distress.     Breath sounds: Normal breath sounds.  Abdominal:     General: Bowel sounds are normal.     Palpations: Abdomen is soft. There is no hepatomegaly or splenomegaly.  Musculoskeletal:      Cervical back: Normal range of motion and neck supple.     Right lower leg: No edema.     Left lower leg: No edema.  Skin:    General: Skin is warm and dry.  Neurological:     Mental Status: She is alert and oriented to person, place, and time.  Psychiatric:        Attention and Perception: Attention normal.        Mood and Affect: Mood normal.        Speech: Speech normal.        Behavior: Behavior normal. Behavior is cooperative.     Results for orders placed or performed in visit on 02/01/19  Lipid Panel w/o Chol/HDL Ratio  Result Value Ref Range   Cholesterol, Total 184 100 - 199 mg/dL   Triglycerides 98 0 - 149 mg/dL   HDL 83 >39 mg/dL  VLDL Cholesterol Cal 17 5 - 40 mg/dL   LDL Chol Calc (NIH) 84 0 - 99 mg/dL   Lipid Comment: CANCELED   Comprehensive metabolic panel  Result Value Ref Range   Glucose 92 65 - 99 mg/dL   BUN 16 8 - 27 mg/dL   Creatinine, Ser 0.97 0.57 - 1.00 mg/dL   GFR calc non Af Amer 59 (L) >59 mL/min/1.73   GFR calc Af Amer 68 >59 mL/min/1.73   BUN/Creatinine Ratio 16 12 - 28   Sodium 142 134 - 144 mmol/L   Potassium 3.6 3.5 - 5.2 mmol/L   Chloride 101 96 - 106 mmol/L   CO2 26 20 - 29 mmol/L   Calcium 10.0 8.7 - 10.3 mg/dL   Total Protein 6.8 6.0 - 8.5 g/dL   Albumin 4.4 3.8 - 4.8 g/dL   Globulin, Total 2.4 1.5 - 4.5 g/dL   Albumin/Globulin Ratio 1.8 1.2 - 2.2   Bilirubin Total 0.6 0.0 - 1.2 mg/dL   Alkaline Phosphatase 100 39 - 117 IU/L   AST 21 0 - 40 IU/L   ALT 18 0 - 32 IU/L  VITAMIN D 25 Hydroxy (Vit-D Deficiency, Fractures)  Result Value Ref Range   Vit D, 25-Hydroxy 30.0 30.0 - 100.0 ng/mL  Vitamin B12  Result Value Ref Range   Vitamin B-12 883 232 - 1,245 pg/mL  CBC with Differential/Platelet  Result Value Ref Range   WBC 5.2 3.4 - 10.8 x10E3/uL   RBC 4.90 3.77 - 5.28 x10E6/uL   Hemoglobin 14.3 11.1 - 15.9 g/dL   Hematocrit 43.2 34.0 - 46.6 %   MCV 88 79 - 97 fL   MCH 29.2 26.6 - 33.0 pg   MCHC 33.1 31.5 - 35.7 g/dL    RDW 13.9 11.7 - 15.4 %   Platelets 293 150 - 450 x10E3/uL   Neutrophils 47 Not Estab. %   Lymphs 36 Not Estab. %   Monocytes 13 Not Estab. %   Eos 3 Not Estab. %   Basos 1 Not Estab. %   Neutrophils Absolute 2.5 1.4 - 7.0 x10E3/uL   Lymphocytes Absolute 1.9 0.7 - 3.1 x10E3/uL   Monocytes Absolute 0.7 0.1 - 0.9 x10E3/uL   EOS (ABSOLUTE) 0.1 0.0 - 0.4 x10E3/uL   Basophils Absolute 0.0 0.0 - 0.2 x10E3/uL   Immature Granulocytes 0 Not Estab. %   Immature Grans (Abs) 0.0 0.0 - 0.1 x10E3/uL  Iron, TIBC and Ferritin Panel  Result Value Ref Range   Total Iron Binding Capacity 356 250 - 450 ug/dL   UIBC 287 118 - 369 ug/dL   Iron 69 27 - 139 ug/dL   Iron Saturation 19 15 - 55 %   Ferritin 13 (L) 15 - 150 ng/mL      Assessment & Plan:   Problem List Items Addressed This Visit      Cardiovascular and Mediastinum   Essential hypertension    Chronic, ongoing with BP at goal today and on occasional home readings.  Recommend continuing to check BP at home a few days a week.  Continue current medication regimen and adjust as needed.  Obtain BMP today.  Return in 6 months for annual physical.      Relevant Orders   Basic Metabolic Panel (BMET)     Endocrine   Hypothyroidism    Chronic, ongoing.  Continue current medication regimen as recommended by Dr. Lissa Merlin and collaboration with endocrinology.          Nervous and Auditory  Epilepsy ([address]) - Primary    Chronic, stable.  Continue current medication regimen as recommended by neuro.  She reports they do not check Keppra level, states they report no need to.        Genitourinary   CKD (chronic kidney disease) stage 3, GFR 30-59 ml/min (Chronic)    Chronic, stable.  Avoid NSAIDs.  Continue Losartan for kidney protection.  BMP today.        Other   Hyperlipemia    Chronic, ongoing.  Continue current medication regimen and adjust as needed.  Lipid panel today. Return in 6 months for annual physical.      Relevant Orders   Lipid  Panel w/o Chol/HDL Ratio   Depression    Chronic, ongoing.  Continue current medication regimen, Remeron, and adjust as needed.  Denies SI/HI.  Return in 6 months for annual physical.      Anxiety    Chronic, stable.  Continue current medication regimen, Remeron, and adjust as needed.  May consider Buspar for anxiety.  Avoid benzo due to age.        Vitamin D deficiency    Chronic, ongoing.  Continue daily supplement and adjust as needed.  Last DEXA 2020.  Check Vit D level today.      Relevant Orders   VITAMIN D 25 Hydroxy (Vit-D Deficiency, Fractures)   Iron deficiency anemia    Check CBC, ferritin, iron, and B12 today.  Continue daily supplement and adjust plan of care as needed based on labs.      Relevant Orders   CBC with Differential/Platelet   Iron, TIBC and Ferritin Panel   B12 deficiency    Chronic, ongoing.  Continue daily supplement and adjust as needed.  Check B12 level today.      Relevant Orders   Vitamin B12   CBC with Differential/Platelet       Follow up plan: Return in about 6 months (around 02/02/2020) for Annual physical.

## 2019-08-02 NOTE — Assessment & Plan Note (Signed)
Chronic, ongoing.  Continue current medication regimen and adjust as needed.  Lipid panel today. Return in 6 months for annual physical.

## 2019-08-02 NOTE — Assessment & Plan Note (Signed)
Check CBC, ferritin, iron, and B12 today.  Continue daily supplement and adjust plan of care as needed based on labs. 

## 2019-08-03 LAB — VITAMIN D 25 HYDROXY (VIT D DEFICIENCY, FRACTURES): Vit D, 25-Hydroxy: 33.9 ng/mL (ref 30.0–100.0)

## 2019-08-03 LAB — CBC WITH DIFFERENTIAL/PLATELET
Basophils Absolute: 0 10*3/uL (ref 0.0–0.2)
Basos: 1 %
EOS (ABSOLUTE): 0.1 10*3/uL (ref 0.0–0.4)
Eos: 2 %
Hematocrit: 44.8 % (ref 34.0–46.6)
Hemoglobin: 14.3 g/dL (ref 11.1–15.9)
Immature Grans (Abs): 0 10*3/uL (ref 0.0–0.1)
Immature Granulocytes: 0 %
Lymphocytes Absolute: 1.8 10*3/uL (ref 0.7–3.1)
Lymphs: 33 %
MCH: 28.9 pg (ref 26.6–33.0)
MCHC: 31.9 g/dL (ref 31.5–35.7)
MCV: 91 fL (ref 79–97)
Monocytes Absolute: 0.7 10*3/uL (ref 0.1–0.9)
Monocytes: 12 %
Neutrophils Absolute: 2.8 10*3/uL (ref 1.4–7.0)
Neutrophils: 52 %
Platelets: 273 10*3/uL (ref 150–450)
RBC: 4.95 x10E6/uL (ref 3.77–5.28)
RDW: 13.6 % (ref 11.7–15.4)
WBC: 5.4 10*3/uL (ref 3.4–10.8)

## 2019-08-03 LAB — VITAMIN B12: Vitamin B-12: 864 pg/mL (ref 232–1245)

## 2019-08-03 LAB — LIPID PANEL W/O CHOL/HDL RATIO
Cholesterol, Total: 178 mg/dL (ref 100–199)
HDL: 80 mg/dL (ref >39)
LDL Chol Calc (NIH): 79 mg/dL (ref 0–99)
Triglycerides: 107 mg/dL (ref 0–149)
VLDL Cholesterol Cal: 19 mg/dL (ref 5–40)

## 2019-08-03 LAB — BASIC METABOLIC PANEL
BUN/Creatinine Ratio: 20 (ref 12–28)
BUN: 18 mg/dL (ref 8–27)
CO2: 26 mmol/L (ref 20–29)
Calcium: 10.5 mg/dL — ABNORMAL HIGH (ref 8.7–10.3)
Chloride: 100 mmol/L (ref 96–106)
Creatinine, Ser: 0.88 mg/dL (ref 0.57–1.00)
GFR calc Af Amer: 77 mL/min/{1.73_m2} (ref >59)
GFR calc non Af Amer: 67 mL/min/{1.73_m2} (ref >59)
Glucose: 92 mg/dL (ref 65–99)
Potassium: 3.7 mmol/L (ref 3.5–5.2)
Sodium: 139 mmol/L (ref 134–144)

## 2019-08-03 LAB — IRON,TIBC AND FERRITIN PANEL
Ferritin: 14 ng/mL — ABNORMAL LOW (ref 15–150)
Iron Saturation: 19 % (ref 15–55)
Iron: 71 ug/dL (ref 27–139)
Total Iron Binding Capacity: 370 ug/dL (ref 250–450)
UIBC: 299 ug/dL (ref 118–369)

## 2019-08-03 NOTE — Progress Notes (Signed)
Contacted via MyChart

## 2019-08-25 ENCOUNTER — Telehealth: Payer: Self-pay

## 2019-08-25 NOTE — Telephone Encounter (Signed)
Call to patient to notify her that she is due for her 2 year colonoscopy follow up. She states that she will be seeing Dr Bary Castilla for this exam.

## 2019-09-01 ENCOUNTER — Telehealth: Payer: PPO

## 2019-09-01 ENCOUNTER — Ambulatory Visit: Payer: Self-pay | Admitting: General Practice

## 2019-09-01 NOTE — Chronic Care Management (AMB) (Signed)
  Chronic Care Management   Outreach Note  09/01/2019 Name: Rachel Cox MRN: [id number] DOB: 1950-11-09  Referred by: Venita Lick, NP Reason for referral : Chronic Care Management (Initial Outreach: RNCM Chronic Disease management and Care coordination needs)   An unsuccessful telephone outreach was attempted today. The patient was referred to the case management team for assistance with care management and care coordination.   Follow Up Plan: A HIPPA compliant phone message was left for the patient providing contact information and requesting a return call.   Noreene Larsson RN, MSN, [facility] Mobile: [phone]

## 2019-10-13 ENCOUNTER — Telehealth: Payer: PPO | Admitting: General Practice

## 2019-10-13 ENCOUNTER — Ambulatory Visit (INDEPENDENT_AMBULATORY_CARE_PROVIDER_SITE_OTHER): Payer: PPO | Admitting: General Practice

## 2019-10-13 DIAGNOSIS — N1831 Chronic kidney disease, stage 3a: Secondary | ICD-10-CM

## 2019-10-13 DIAGNOSIS — I10 Essential (primary) hypertension: Secondary | ICD-10-CM | POA: Diagnosis not present

## 2019-10-13 DIAGNOSIS — E782 Mixed hyperlipidemia: Secondary | ICD-10-CM | POA: Diagnosis not present

## 2019-10-13 NOTE — Chronic Care Management (AMB) (Signed)
Chronic Care Management   Initial Visit Note  10/13/2019 Name: Rachel Cox MRN: [id number] DOB: 1950-06-12  Referred by: Venita Lick, NP Reason for referral : Chronic Care Management (Initial outreach: 2nd call for RNCM Chronic Disease Management and Care coordination needs)   Rachel Cox is a 69 y.o. year old female who is a primary care patient of Cannady, Rachel Faster, NP. The CCM team was consulted for assistance with chronic disease management and care coordination needs related to HTN, HLD and CKD Stage 3  Review of patient status, including review of consultants reports, relevant laboratory and other test results, and collaboration with appropriate care team members and the patient's provider was performed as part of comprehensive patient evaluation and provision of chronic care management services.    SDOH (Social Determinants of Health) assessments performed: Yes See Care Plan activities for detailed interventions related to SDOH  SDOH Interventions     Most Recent Value  SDOH Interventions  SDOH Interventions for the Following Domains  Physical Activity  Physical Activity Interventions  Other (Comments) [does not do any structured activity- is walking a little outside]  Alcohol Brief Interventions/Follow-up  AUDIT Score <7 follow-up not indicated       Medications: Outpatient Encounter Medications as of 10/13/2019  Medication Sig  . atorvastatin (LIPITOR) 40 MG tablet Take 1 tablet (40 mg total) by mouth at bedtime.  . Cholecalciferol 25 MCG (1000 UT) tablet Take 1 tablet (1,000 Units total) by mouth daily.  . fluticasone (FLONASE) 50 MCG/ACT nasal spray Place 2 sprays into both nostrils daily as needed for allergies.  . folic acid (FOLVITE) 574 MCG tablet Take 400 mcg by mouth daily.  . hydrochlorothiazide (HYDRODIURIL) 25 MG tablet Take 1 tablet (25 mg total) by mouth daily.  Marland Kitchen levETIRAcetam (KEPPRA) 250 MG tablet Take 250 mg by mouth at bedtime.   Marland Kitchen levothyroxine  (SYNTHROID) 75 MCG tablet Take 1 tablet (75 mcg total) by mouth daily. Take one tablet every morning  . losartan (COZAAR) 100 MG tablet Take 1 tablet (100 mg total) by mouth daily.  . mirtazapine (REMERON) 30 MG tablet Take 30 mg by mouth every morning.   . Multiple Vitamin (MULTIVITAMIN) tablet Take 1 tablet by mouth 3 (three) times a week.   . pantoprazole (PROTONIX) 40 MG tablet Take 1 tablet (40 mg total) by mouth daily.  . vitamin B-12 (CYANOCOBALAMIN) 500 MCG tablet Take 500 mcg by mouth daily.   No facility-administered encounter medications on file as of 10/13/2019.     Objective:  BP Readings from Last 3 Encounters:  08/02/19 127/82  02/01/19 132/80  02/01/19 132/80    Goals Addressed            This Visit's Progress   . RNCM: Pt-"I take my blood pressure every       CARE PLAN ENTRY (see longtitudinal plan of care for additional care plan information)  Current Barriers:  . Chronic Disease Management support, education, and care coordination needs related to HTN, HLD, and CKD Stage 3  Clinical Goal(s) related to HTN, HLD, and CKD Stage 3 :  Over the next 120 days, patient will:  . Work with the care management team to address educational, disease management, and care coordination needs  . Begin or continue self health monitoring activities as directed today Measure and record blood pressure 3 times per week and adhere to a heart healthy diet  . Call provider office for new or worsened signs and symptoms Blood  pressure findings outside established parameters, Shortness of breath, and New or worsened symptom related to HLD/CKD and other chronic conditions.  . Call care management team with questions or concerns . Verbalize basic understanding of patient centered plan of care established today  Interventions related to HTN, HLD, and CKD Stage 3 :  . Evaluation of current treatment plans and patient's adherence to plan as established by provider . Assessed patient  understanding of disease states . Assessed patient's education and care coordination needs . Provided disease specific education to patient  . Collaborated with appropriate clinical care team members regarding patient needs  Patient Self Care Activities related to HTN, HLD, and CKD Stage 3 :  . Patient is unable to independently self-manage chronic health conditions  Initial goal documentation         [name] was given information about Chronic Care Management services today including:  1. CCM service includes personalized support from designated clinical staff supervised by her physician, including individualized plan of care and coordination with other care providers 2. 24/7 contact phone numbers for assistance for urgent and routine care needs. 3. Service will only be billed when office clinical staff spend 20 minutes or more in a month to coordinate care. 4. Only one practitioner may furnish and bill the service in a calendar month. 5. The patient may stop CCM services at any time (effective at the end of the month) by phone call to the office staff. 6. The patient will be responsible for cost sharing (co-pay) of up to 20% of the service fee (after annual deductible is met).  Patient agreed to services and verbal consent obtained.   Plan:   The care management team will reach out to the patient again over the next 60 to 90 days.   Noreene Larsson RN, MSN, [facility] Mobile: [phone]

## 2019-10-13 NOTE — Patient Instructions (Signed)
Visit Information  Goals Addressed            This Visit's Progress   . RNCM: Pt-"I take my blood pressure every       CARE PLAN ENTRY (see longtitudinal plan of care for additional care plan information)  Current Barriers:  . Chronic Disease Management support, education, and care coordination needs related to HTN, HLD, and CKD Stage 3  Clinical Goal(s) related to HTN, HLD, and CKD Stage 3 :  Over the next 120 days, patient will:  . Work with the care management team to address educational, disease management, and care coordination needs  . Begin or continue self health monitoring activities as directed today Measure and record blood pressure 3 times per week and adhere to a heart healthy diet  . Call provider office for new or worsened signs and symptoms Blood pressure findings outside established parameters, Shortness of breath, and New or worsened symptom related to HLD/CKD and other chronic conditions.  . Call care management team with questions or concerns . Verbalize basic understanding of patient centered plan of care established today  Interventions related to HTN, HLD, and CKD Stage 3 :  . Evaluation of current treatment plans and patient's adherence to plan as established by provider . Assessed patient understanding of disease states . Assessed patient's education and care coordination needs . Provided disease specific education to patient  . Collaborated with appropriate clinical care team members regarding patient needs  Patient Self Care Activities related to HTN, HLD, and CKD Stage 3 :  . Patient is unable to independently self-manage chronic health conditions  Initial goal documentation        Rachel Cox was given information about Chronic Care Management services today including:  1. CCM service includes personalized support from designated clinical staff supervised by her physician, including individualized plan of care and coordination with other care  providers 2. 24/7 contact phone numbers for assistance for urgent and routine care needs. 3. Service will only be billed when office clinical staff spend 20 minutes or more in a month to coordinate care. 4. Only one practitioner may furnish and bill the service in a calendar month. 5. The patient may stop CCM services at any time (effective at the end of the month) by phone call to the office staff. 6. The patient will be responsible for cost sharing (co-pay) of up to 20% of the service fee (after annual deductible is met).  Patient agreed to services and verbal consent obtained.   Patient verbalizes understanding of instructions provided today.   The care management team will reach out to the patient again over the next 60 to 90  days.   Noreene Larsson RN, MSN, [facility] Mobile: [phone]

## 2019-10-15 ENCOUNTER — Other Ambulatory Visit: Payer: Self-pay

## 2019-10-19 ENCOUNTER — Ambulatory Visit: Payer: PPO | Admitting: Endocrinology

## 2019-10-19 ENCOUNTER — Encounter: Payer: Self-pay | Admitting: Endocrinology

## 2019-10-19 ENCOUNTER — Other Ambulatory Visit: Payer: Self-pay

## 2019-10-19 VITALS — BP 154/86 | HR 94 | Ht 66.0 in | Wt 180.0 lb

## 2019-10-19 DIAGNOSIS — E039 Hypothyroidism, unspecified: Secondary | ICD-10-CM

## 2019-10-19 NOTE — Patient Instructions (Addendum)
Your blood pressure is high today.  Please see your primary care provider soon, to have it rechecked You seem to need less thyroid medication than in the past.  This may be due to your having 2 different types of thyroid problems--chronic inflammation which causes a low thyroid, and lumps, which can cause a high thyroid.    A thyroid blood test is requested for you today.  We'll let you know about the results.   Please come back for a follow-up appointment in 1 year.

## 2019-10-19 NOTE — Progress Notes (Signed)
Subjective:    Patient ID: Rachel Cox, female    DOB: Oct 30, 1950, 69 y.o.   MRN: [id number]  HPI Pt returns for f/u of chronic primary hypothyroidism (dx'ed 2008; she has been on prescribed thyroid hormone therapy since then; she was on synthroid 150 mcg/day at one point, but she now requires less; US shows just 1 small nodule).   pt states she feels well in general.  She does not notice the goiter.   Past Medical History:  Diagnosis Date  . Allergic rhinitis   . Allergy   . Anemia   . Anxiety   . Depression   . Epilepsy ([place])    managed by Dr. Rexene Alberts 2015/2016  . GERD (gastroesophageal reflux disease)   . History of abnormal cervical Pap smear 1980's  . Hyperglycemia   . Hyperlipidemia   . Hypertension   . Hypothyroidism   . Insomnia   . Obesity (BMI 30.0-34.9) 01/06/2017  . Osteopenia   . Reflux esophagitis 10/31/2017   EGD    Past Surgical History:  Procedure Laterality Date  . COLONOSCOPY  Sept 2015  . COLONOSCOPY WITH PROPOFOL N/A 09/10/2017   Procedure: COLONOSCOPY WITH PROPOFOL;  Surgeon: Robert Bellow, MD;  Location: [facility];  Service: Endoscopy;  Laterality: N/A;  . Cryo Procedure  1980s   for abnormal pap  . ESOPHAGOGASTRODUODENOSCOPY (EGD) WITH PROPOFOL N/A 09/10/2017   Procedure: ESOPHAGOGASTRODUODENOSCOPY (EGD) WITH PROPOFOL;  Surgeon: Robert Bellow, MD;  Location: [facility] END[facility];  Service: Endoscopy;  Laterality: N/A;  . ESOPHAGOGASTRODUODENOSCOPY (EGD) WITH PROPOFOL N/A 10/29/2017   Procedure: ESOPHAGOGASTRODUODENOSCOPY (EGD) WITH PROPOFOL;  Surgeon: Robert Bellow, MD;  Location: [facility] END[facility];  Service: Endoscopy;  Laterality: N/A;  . LIPOMA EXCISION N/A 12/12/2017   Procedure: EXCISION BACK LIPOMA;  Surgeon: Robert Bellow, MD;  Location: [facility] ORS;  Service: General;  Laterality: N/A;  . [place] History   Socioeconomic History  . Marital status: Married    Spouse name: Girard Cooter  . Number of  children: 4  . Years of education: Not on file  . Highest education level: 10th grade  Occupational History  . Occupation: Retired  Tobacco Use  . Smoking status: Never Smoker  . Smokeless tobacco: Never Used  Substance and Sexual Activity  . Alcohol use: Yes    Comment: wine occ  . Drug use: No  . Sexual activity: Not Currently    Birth control/protection: Post-menopausal  Other Topics Concern  . Not on file  Social History Narrative  . Not on file   Social Determinants of Health   Financial Resource Strain: Low Risk   . Difficulty of Paying Living Expenses: Not hard at all  Food Insecurity: No Food Insecurity  . Worried About Charity fundraiser in the Last Year: Never true  . Ran Out of Food in the Last Year: Never true  Transportation Needs: No Transportation Needs  . Lack of Transportation (Medical): No  . Lack of Transportation (Non-Medical): No  Physical Activity: Inactive  . Days of Exercise per Week: 0 days  . Minutes of Exercise per Session: 0 min  Stress: No Stress Concern Present  . Feeling of Stress : Not at all  Social Connections: Not Isolated  . Frequency of Communication with Friends and Family: More than three times a week  . Frequency of Social Gatherings with Friends and Family: More than three times a week  . Attends Religious Services: More than  4 times per year  . Active Member of Clubs or Organizations: Yes  . Attends Archivist Meetings: More than 4 times per year  . Marital Status: Married  Human resources officer Violence: Not At Risk  . Fear of Current or Ex-Partner: No  . Emotionally Abused: No  . Physically Abused: No  . Sexually Abused: No    Current Outpatient Medications on File Prior to Visit  Medication Sig Dispense Refill  . atorvastatin (LIPITOR) 40 MG tablet Take 1 tablet (40 mg total) by mouth at bedtime. 90 tablet 3  . Cholecalciferol 25 MCG (1000 UT) tablet Take 1 tablet (1,000 Units total) by mouth daily. 90 tablet 3  .  fluticasone (FLONASE) 50 MCG/ACT nasal spray Place 2 sprays into both nostrils daily as needed for allergies. 16 g 3  . folic acid (FOLVITE) A999333 MCG tablet Take 400 mcg by mouth daily.    . hydrochlorothiazide (HYDRODIURIL) 25 MG tablet Take 1 tablet (25 mg total) by mouth daily. 90 tablet 3  . levETIRAcetam (KEPPRA) 250 MG tablet Take 250 mg by mouth at bedtime.     Marland Kitchen losartan (COZAAR) 100 MG tablet Take 1 tablet (100 mg total) by mouth daily. 90 tablet 3  . mirtazapine (REMERON) 30 MG tablet Take 30 mg by mouth every morning.     . Multiple Vitamin (MULTIVITAMIN) tablet Take 1 tablet by mouth 3 (three) times a week.     . pantoprazole (PROTONIX) 40 MG tablet Take 1 tablet (40 mg total) by mouth daily. 90 tablet 3  . vitamin B-12 (CYANOCOBALAMIN) 500 MCG tablet Take 500 mcg by mouth daily.     No current facility-administered medications on file prior to visit.    Allergies  Allergen Reactions  . Aspirin Nausea Only  . Codeine Palpitations    Pt vocalized    Family History  Problem Relation Age of Onset  . Hyperlipidemia Mother   . Hypertension Mother   . CAD Mother   . Thyroid disease Mother   . Heart disease Mother   . COPD Father   . Thyroid disease Father   . Cancer Brother        liver  . Alcohol abuse Brother   . Hypertension Brother   . Liver disease Brother   . Thyroid disease Sister   . Diabetes Neg Hx   . Stroke Neg Hx     BP (!) 154/86   Pulse 94   Ht 5\' 6"  (1.676 m)   Wt 180 lb (81.6 kg)   SpO2 97%   BMI 29.05 kg/m    Review of Systems     Objective:   Physical Exam VITAL SIGNS:  See vs page GENERAL: no distress NECK: There is no palpable thyroid enlargement.  No thyroid nodule is palpable.  No palpable lymphadenopathy at the anterior neck.   Lab Results  Component Value Date   TSH 3.00 10/19/2019   T3TOTAL 123 12/19/2014   T4TOTAL 14.2 (H) 12/19/2014      Assessment & Plan:  HTN: is noted today.   Hypothyroidism: well-replaced. Please  continue the same medication.    Patient Instructions  Your blood pressure is high today.  Please see your primary care provider soon, to have it rechecked You seem to need less thyroid medication than in the past.  This may be due to your having 2 different types of thyroid problems--chronic inflammation which causes a low thyroid, and lumps, which can cause a high thyroid.  A thyroid blood test is requested for you today.  We'll let you know about the results.   Please come back for a follow-up appointment in 1 year.

## 2019-10-20 ENCOUNTER — Other Ambulatory Visit: Payer: Self-pay | Admitting: Endocrinology

## 2019-10-20 ENCOUNTER — Encounter: Payer: Self-pay | Admitting: Endocrinology

## 2019-10-20 LAB — T4, FREE: Free T4: 1.23 ng/dL (ref 0.60–1.60)

## 2019-10-20 LAB — TSH: TSH: 3 u[IU]/mL (ref 0.35–4.50)

## 2019-10-20 MED ORDER — LEVOTHYROXINE SODIUM 75 MCG PO TABS: 75.0000 ug | ORAL_TABLET | Freq: Every day | ORAL | 3 refills | Status: DC

## 2019-12-15 ENCOUNTER — Telehealth: Payer: Self-pay | Admitting: General Practice

## 2019-12-15 ENCOUNTER — Ambulatory Visit (INDEPENDENT_AMBULATORY_CARE_PROVIDER_SITE_OTHER): Payer: PPO | Admitting: General Practice

## 2019-12-15 DIAGNOSIS — N1831 Chronic kidney disease, stage 3a: Secondary | ICD-10-CM | POA: Diagnosis not present

## 2019-12-15 DIAGNOSIS — E782 Mixed hyperlipidemia: Secondary | ICD-10-CM | POA: Diagnosis not present

## 2019-12-15 DIAGNOSIS — E039 Hypothyroidism, unspecified: Secondary | ICD-10-CM | POA: Diagnosis not present

## 2019-12-15 DIAGNOSIS — I10 Essential (primary) hypertension: Secondary | ICD-10-CM

## 2019-12-15 NOTE — Chronic Care Management (AMB) (Signed)
Chronic Care Management   Follow Up Note   12/15/2019 Name: Rachel Cox MRN: [id number] DOB: [birth year]  Referred by: Venita Lick, NP Reason for referral : Chronic Care Management (RNCM Follow up: Chronic Disease Management and Care Coordination Needs)   Rachel Cox is a 69 y.o. year old female who is a primary care patient of Cannady, Barbaraann Faster, NP. The CCM team was consulted for assistance with chronic disease management and care coordination needs.    Review of patient status, including review of consultants reports, relevant laboratory and other test results, and collaboration with appropriate care team members and the patient's provider was performed as part of comprehensive patient evaluation and provision of chronic care management services.    SDOH (Social Determinants of Health) assessments performed: Yes See Care Plan activities for detailed interventions related to [facility])     Outpatient Encounter Medications as of 12/15/2019  Medication Sig   atorvastatin (LIPITOR) 40 MG tablet Take 1 tablet (40 mg total) by mouth at bedtime.   Cholecalciferol 25 MCG (1000 UT) tablet Take 1 tablet (1,000 Units total) by mouth daily.   fluticasone (FLONASE) 50 MCG/ACT nasal spray Place 2 sprays into both nostrils daily as needed for allergies.   folic acid (FOLVITE) 433 MCG tablet Take 400 mcg by mouth daily.   hydrochlorothiazide (HYDRODIURIL) 25 MG tablet Take 1 tablet (25 mg total) by mouth daily.   levETIRAcetam (KEPPRA) 250 MG tablet Take 250 mg by mouth at bedtime.    levothyroxine (SYNTHROID) 75 MCG tablet Take 1 tablet (75 mcg total) by mouth daily. Take one tablet every morning   losartan (COZAAR) 100 MG tablet Take 1 tablet (100 mg total) by mouth daily.   mirtazapine (REMERON) 30 MG tablet Take 30 mg by mouth every morning.    Multiple Vitamin (MULTIVITAMIN) tablet Take 1 tablet by mouth 3 (three) times a week.    pantoprazole (PROTONIX) 40 MG tablet Take 1 tablet  (40 mg total) by mouth daily.   vitamin B-12 (CYANOCOBALAMIN) 500 MCG tablet Take 500 mcg by mouth daily.   No facility-administered encounter medications on file as of 12/15/2019.     Objective:  BP Readings from Last 3 Encounters:  10/19/19 (!) 154/86  08/02/19 127/82  02/01/19 132/80    Goals Addressed              This Visit's Progress     RNCM: Pt-"I take my blood pressure every day" (pt-stated)        CARE PLAN ENTRY (see longtitudinal plan of care for additional care plan information)  Current Barriers:   Chronic Disease Management support, education, and care coordination needs related to HTN, HLD, and CKD Stage 3  Clinical Goal(s) related to HTN, HLD, and CKD Stage 3 :  Over the next 120 days, patient will:   Work with the care management team to address educational, disease management, and care coordination needs   Begin or continue self health monitoring activities as directed today Measure and record blood pressure 3 times per week and adhere to a heart healthy diet   Call provider office for new or worsened signs and symptoms Blood pressure findings outside established parameters, Shortness of breath, and New or worsened symptom related to HLD/CKD and other chronic conditions.   Call care management team with questions or concerns  Verbalize basic understanding of patient centered plan of care established today  Interventions related to HTN, HLD, and CKD Stage 3 :   Evaluation  of current treatment plans and patient's adherence to plan as established by provider.  The patient verbalized understanding of the plan of care. The patient saw the MD for her thyroid follow up recently. Her thyroid condition is stable.   Assessed patient understanding of disease states.  The patient has a good understanding of her chronic conditions. The patient takes her blood pressures at home. Average is 130/80. The patients was elevated at the specialist office.  The patient says  she doesn't know why but she does have the "white coat syndrome" when going to the doctor. Will continue to monitor.   Assessed patient's education and care coordination needs.  Patient denies any new concerns at this time. Will continue to monitor.   Provided disease specific education to patient. Evaluation of heart healthy diet. The patient states she does not always do a good job at this but she is trying to do better. Discussed fresh fruits and vegetables. The patient is enjoying seasonal foods. Education and support.   Collaborated with appropriate clinical care team members regarding patient needs.  Denies any needs from the LCSW or pharmacist at this time. Will continue to monitor.   Patient Self Care Activities related to HTN, HLD, and CKD Stage 3 :   Patient is unable to independently self-manage chronic health conditions  Please see past updates related to this goal by clicking on the "Past Updates" button in the selected goal          Plan:   The care management team will reach out to the patient again over the next 60 to 90 days.    Noreene Larsson RN, MSN, [facility] Mobile: [phone]

## 2019-12-15 NOTE — Patient Instructions (Signed)
Visit Information  Goals Addressed              This Visit's Progress     RNCM: Pt-"I take my blood pressure every day" (pt-stated)        CARE PLAN ENTRY (see longtitudinal plan of care for additional care plan information)  Current Barriers:   Chronic Disease Management support, education, and care coordination needs related to HTN, HLD, and CKD Stage 3  Clinical Goal(s) related to HTN, HLD, and CKD Stage 3 :  Over the next 120 days, patient will:   Work with the care management team to address educational, disease management, and care coordination needs   Begin or continue self health monitoring activities as directed today Measure and record blood pressure 3 times per week and adhere to a heart healthy diet   Call provider office for new or worsened signs and symptoms Blood pressure findings outside established parameters, Shortness of breath, and New or worsened symptom related to HLD/CKD and other chronic conditions.   Call care management team with questions or concerns  Verbalize basic understanding of patient centered plan of care established today  Interventions related to HTN, HLD, and CKD Stage 3 :   Evaluation of current treatment plans and patient's adherence to plan as established by provider.  The patient verbalized understanding of the plan of care. The patient saw the MD for her thyroid follow up recently. Her thyroid condition is stable.   Assessed patient understanding of disease states.  The patient has a good understanding of her chronic conditions. The patient takes her blood pressures at home. Average is 130/80. The patients was elevated at the specialist office.  The patient says she doesn't know why but she does have the "white coat syndrome" when going to the doctor. Will continue to monitor.   Assessed patient's education and care coordination needs.  Patient denies any new concerns at this time. Will continue to monitor.   Provided disease specific  education to patient. Evaluation of heart healthy diet. The patient states she does not always do a good job at this but she is trying to do better. Discussed fresh fruits and vegetables. The patient is enjoying seasonal foods. Education and support.   Collaborated with appropriate clinical care team members regarding patient needs.  Denies any needs from the LCSW or pharmacist at this time. Will continue to monitor.   Patient Self Care Activities related to HTN, HLD, and CKD Stage 3 :   Patient is unable to independently self-manage chronic health conditions  Please see past updates related to this goal by clicking on the "Past Updates" button in the selected goal         Patient verbalizes understanding of instructions provided today.   The care management team will reach out to the patient again over the next 60 to 90 days.   Noreene Larsson RN, MSN, [facility] Mobile: [phone]

## 2019-12-22 DIAGNOSIS — H40153 Residual stage of open-angle glaucoma, bilateral: Secondary | ICD-10-CM | POA: Diagnosis not present

## 2020-01-05 ENCOUNTER — Other Ambulatory Visit: Payer: Self-pay | Admitting: Nurse Practitioner

## 2020-01-05 MED ORDER — ATORVASTATIN CALCIUM 40 MG PO TABS: 40.0000 mg | ORAL_TABLET | Freq: Every day | ORAL | 1 refills | Status: DC

## 2020-01-05 NOTE — Telephone Encounter (Signed)
Requested medication (s) are due for refill today: Yes  Requested medication (s) are on the active medication list: Yes  Last refill:  8/3//20  Future visit scheduled: Yes  Notes to clinic:  Prescription has expired.    Requested Prescriptions  Pending Prescriptions Disp Refills   atorvastatin (LIPITOR) 40 MG tablet 90 tablet 3    Sig: Take 1 tablet (40 mg total) by mouth at bedtime.      Cardiovascular:  Antilipid - Statins Failed - 01/05/2020 11:27 AM      Failed - LDL in normal range and within 360 days    LDL Cholesterol (Calc)  Date Value Ref Range Status  07/13/2018 103 (H) mg/dL (calc) Final    Comment:    Reference range: <100 . Desirable range <100 mg/dL for primary prevention;   <70 mg/dL for patients with CHD or diabetic patients  with > or = 2 CHD risk factors. Marland Kitchen LDL-C is now calculated using the Martin-Hopkins  calculation, which is a validated novel method providing  better accuracy than the Friedewald equation in the  estimation of LDL-C.  Cresenciano Genre et al. Annamaria Helling. 9038;333(83): 2061-2068  (http://education.[employer].com/faq/FAQ164)    LDL Chol Calc (NIH)  Date Value Ref Range Status  08/02/2019 79 0 - 99 mg/dL Final          Passed - Total Cholesterol in normal range and within 360 days    Cholesterol, Total  Date Value Ref Range Status  08/02/2019 178 100 - 199 mg/dL Final          Passed - HDL in normal range and within 360 days    HDL  Date Value Ref Range Status  08/02/2019 80 >39 mg/dL Final          Passed - Triglycerides in normal range and within 360 days    Triglycerides  Date Value Ref Range Status  08/02/2019 107 0 - 149 mg/dL Final          Passed - Patient is not pregnant      Passed - Valid encounter within last 12 months    Recent Outpatient Visits           5 months ago 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 status epilepticus, not intractable ([place])   [place], [name] T, NP   11 months ago CKD (chronic  kidney disease) stage 3, GFR 30-59 ml/min ([place])   [place], [place] T, NP   1 year ago 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 status epilepticus, not intractable ([place])   [place], Barbaraann Faster, NP   1 year ago Essential hypertension   [place], Satira Anis, MD   1 year ago Essential hypertension   [place], Satira Anis, MD       Future Appointments             In 4 weeks Cannady, Barbaraann Faster, NP [facility]

## 2020-01-05 NOTE — Telephone Encounter (Signed)
Copied from [place] [id number]. Topic: Quick Communication - Rx Refill/Question >> Jan 05, 2020 11:24 AM Yvette Rack wrote: Medication: atorvastatin (LIPITOR) 40 MG tablet  Has the patient contacted their pharmacy? no  Preferred Pharmacy (with phone number or street name): [facility] [place] - [place] Phone: [phone]  Fax: [phone]  Agent: Please be advised that RX refills may take up to 3 business days. We ask that you follow-up with your pharmacy.

## 2020-01-05 NOTE — Telephone Encounter (Signed)
Patient last seen 08/02/19 and has appointment 02/02/20

## 2020-02-02 ENCOUNTER — Ambulatory Visit (INDEPENDENT_AMBULATORY_CARE_PROVIDER_SITE_OTHER): Payer: PPO | Admitting: Nurse Practitioner

## 2020-02-02 ENCOUNTER — Other Ambulatory Visit: Payer: Self-pay

## 2020-02-02 ENCOUNTER — Encounter: Payer: Self-pay | Admitting: Nurse Practitioner

## 2020-02-02 VITALS — BP 127/85 | HR 79 | Temp 98.1°F | Ht 63.5 in | Wt 181.0 lb

## 2020-02-02 DIAGNOSIS — N1831 Chronic kidney disease, stage 3a: Secondary | ICD-10-CM | POA: Diagnosis not present

## 2020-02-02 DIAGNOSIS — K2100 Gastro-esophageal reflux disease with esophagitis, without bleeding: Secondary | ICD-10-CM

## 2020-02-02 DIAGNOSIS — E782 Mixed hyperlipidemia: Secondary | ICD-10-CM | POA: Diagnosis not present

## 2020-02-02 DIAGNOSIS — E039 Hypothyroidism, unspecified: Secondary | ICD-10-CM | POA: Diagnosis not present

## 2020-02-02 DIAGNOSIS — F419 Anxiety disorder, unspecified: Secondary | ICD-10-CM | POA: Diagnosis not present

## 2020-02-02 DIAGNOSIS — E559 Vitamin D deficiency, unspecified: Secondary | ICD-10-CM

## 2020-02-02 DIAGNOSIS — F3341 Major depressive disorder, recurrent, in partial remission: Secondary | ICD-10-CM

## 2020-02-02 DIAGNOSIS — D508 Other iron deficiency anemias: Secondary | ICD-10-CM | POA: Diagnosis not present

## 2020-02-02 DIAGNOSIS — F19982 Other psychoactive substance use, unspecified with psychoactive substance-induced sleep disorder: Secondary | ICD-10-CM | POA: Diagnosis not present

## 2020-02-02 DIAGNOSIS — E538 Deficiency of other specified B group vitamins: Secondary | ICD-10-CM

## 2020-02-02 DIAGNOSIS — Z0000 Encounter for general adult medical examination without abnormal findings: Secondary | ICD-10-CM

## 2020-02-02 DIAGNOSIS — M8580 Other specified disorders of bone density and structure, unspecified site: Secondary | ICD-10-CM

## 2020-02-02 DIAGNOSIS — G40802 Other epilepsy, not intractable, without status epilepticus: Secondary | ICD-10-CM | POA: Diagnosis not present

## 2020-02-02 DIAGNOSIS — R809 Proteinuria, unspecified: Secondary | ICD-10-CM

## 2020-02-02 DIAGNOSIS — E6609 Other obesity due to excess calories: Secondary | ICD-10-CM

## 2020-02-02 DIAGNOSIS — Z6831 Body mass index (BMI) 31.0-31.9, adult: Secondary | ICD-10-CM

## 2020-02-02 DIAGNOSIS — I10 Essential (primary) hypertension: Secondary | ICD-10-CM | POA: Diagnosis not present

## 2020-02-02 LAB — MICROALBUMIN, URINE WAIVED
Creatinine, Urine Waived: 200 mg/dL (ref 10–300)
Microalb, Ur Waived: 80 mg/L — ABNORMAL HIGH (ref 0–19)

## 2020-02-02 MED ORDER — MIRTAZAPINE 30 MG PO TABS
30.0000 mg | ORAL_TABLET | ORAL | 4 refills | Status: DC
Start: 2020-02-02 — End: 2021-08-06

## 2020-02-02 MED ORDER — PANTOPRAZOLE SODIUM 40 MG PO TBEC
40.0000 mg | DELAYED_RELEASE_TABLET | Freq: Every day | ORAL | 4 refills | Status: DC
Start: 2020-02-02 — End: 2021-02-06

## 2020-02-02 MED ORDER — HYDROCHLOROTHIAZIDE 25 MG PO TABS
25.0000 mg | ORAL_TABLET | Freq: Every day | ORAL | 4 refills | Status: DC
Start: 2020-02-02 — End: 2021-02-06

## 2020-02-02 MED ORDER — FLUTICASONE PROPIONATE 50 MCG/ACT NA SUSP: 2.0000 | Freq: Every day | NASAL | 4 refills | Status: DC | PRN

## 2020-02-02 MED ORDER — LOSARTAN POTASSIUM 100 MG PO TABS
100.0000 mg | ORAL_TABLET | Freq: Every day | ORAL | 4 refills | Status: DC
Start: 2020-02-02 — End: 2021-02-06

## 2020-02-02 MED ORDER — ATORVASTATIN CALCIUM 40 MG PO TABS
40.0000 mg | ORAL_TABLET | Freq: Every day | ORAL | 4 refills | Status: DC
Start: 2020-02-02 — End: 2021-02-06

## 2020-02-02 MED ORDER — CHOLECALCIFEROL 25 MCG (1000 UT) PO TABS: 1000.0000 [IU] | ORAL_TABLET | Freq: Every day | ORAL | 4 refills | Status: AC

## 2020-02-02 NOTE — Assessment & Plan Note (Signed)
Check CBC, ferritin, iron, and B12 today.  Continue daily supplement and adjust plan of care as needed based on labs.

## 2020-02-02 NOTE — Assessment & Plan Note (Signed)
Chronic, ongoing.  Continue current medication regimen and adjust as needed.  Lipid panel today. Return in 6 months for follow-up.  Refills sent in.

## 2020-02-02 NOTE — Patient Instructions (Addendum)
Vitron OR Slow Fe for iron replacement   Anemia  Anemia is a condition in which you do not have enough red blood cells or hemoglobin. Hemoglobin is a substance in red blood cells that carries oxygen. When you do not have enough red blood cells or hemoglobin (are anemic), your body cannot get enough oxygen and your organs may not work properly. As a result, you may feel very tired or have other problems. What are the causes? Common causes of anemia include:  Excessive bleeding. Anemia can be caused by excessive bleeding inside or outside the body, including bleeding from the intestine or from periods in women.  Poor nutrition.  Long-lasting (chronic) kidney, thyroid, and liver disease.  Bone marrow disorders.  Cancer and treatments for cancer.  HIV (human immunodeficiency virus) and AIDS (acquired immunodeficiency syndrome).  Treatments for HIV and AIDS.  Spleen problems.  Blood disorders.  Infections, medicines, and autoimmune disorders that destroy red blood cells. What are the signs or symptoms? Symptoms of this condition include:  Minor weakness.  Dizziness.  Headache.  Feeling heartbeats that are irregular or faster than normal (palpitations).  Shortness of breath, especially with exercise.  Paleness.  Cold sensitivity.  Indigestion.  Nausea.  Difficulty sleeping.  Difficulty concentrating. Symptoms may occur suddenly or develop slowly. If your anemia is mild, you may not have symptoms. How is this diagnosed? This condition is diagnosed based on:  Blood tests.  Your medical history.  A physical exam.  Bone marrow biopsy. Your health care provider may also check your stool (feces) for blood and may do additional testing to look for the cause of your bleeding. You may also have other tests, including:  Imaging tests, such as a CT scan or MRI.  Endoscopy.  Colonoscopy. How is this treated? Treatment for this condition depends on the cause. If  you continue to lose a lot of blood, you may need to be treated at a hospital. Treatment may include:  Taking supplements of iron, vitamin I78, or folic acid.  Taking a hormone medicine (erythropoietin) that can help to stimulate red blood cell growth.  Having a blood transfusion. This may be needed if you lose a lot of blood.  Making changes to your diet.  Having surgery to remove your spleen. Follow these instructions at home:  Take over-the-counter and prescription medicines only as told by your health care provider.  Take supplements only as told by your health care provider.  Follow any diet instructions that you were given.  Keep all follow-up visits as told by your health care provider. This is important. Contact a health care provider if:  You develop new bleeding anywhere in the body. Get help right away if:  You are very weak.  You are short of breath.  You have pain in your abdomen or chest.  You are dizzy or feel faint.  You have trouble concentrating.  You have bloody or black, tarry stools.  You vomit repeatedly or you vomit up blood. Summary  Anemia is a condition in which you do not have enough red blood cells or enough of a substance in your red blood cells that carries oxygen (hemoglobin).  Symptoms may occur suddenly or develop slowly.  If your anemia is mild, you may not have symptoms.  This condition is diagnosed with blood tests as well as a medical history and physical exam. Other tests may be needed.  Treatment for this condition depends on the cause of the anemia. This information is  not intended to replace advice given to you by your health care provider. Make sure you discuss any questions you have with your health care provider. Document Revised: 05/02/2017 Document Reviewed: 06/21/2016 Elsevier Patient Education  [place].

## 2020-02-02 NOTE — Assessment & Plan Note (Signed)
Chronic, stable.  Continue current medication regimen as recommended by neuro.  She reports they do not check Keppra level, will check today and check renal function for dosing purposes and to ensure no elevation in levels.

## 2020-02-02 NOTE — Assessment & Plan Note (Signed)
Continue Losartan for kidney protection, check urine ALB today. 

## 2020-02-02 NOTE — Assessment & Plan Note (Signed)
Ongoing with DEXA last year, will attempt to obtain this report.  Continue Vit D daily and recommend fall precautions.  Check Vit D level.

## 2020-02-02 NOTE — Assessment & Plan Note (Signed)
Chronic, stable.  Continue current medication regimen, Remeron, and adjust as needed.  May consider Buspar for anxiety.  Avoid benzo due to age.   

## 2020-02-02 NOTE — Assessment & Plan Note (Signed)
Chronic, ongoing with BP at goal for age today and on occasional home readings at goal.  Recommend continuing to check BP at home a few days a week + focus on DASH diet.  Continue current medication regimen and adjust as needed.  Obtain CMP today.  Return in 6 months for follow-up.  Refills sent as needed.

## 2020-02-02 NOTE — Assessment & Plan Note (Signed)
Chronic, ongoing.  Continue current medication regimen, Remeron, and adjust as needed.   

## 2020-02-02 NOTE — Assessment & Plan Note (Signed)
Chronic, ongoing.  Continue current medication regimen as recommended by Dr. Ellis and collaboration with endocrinology.   ?

## 2020-02-02 NOTE — Assessment & Plan Note (Signed)
Recommended eating smaller high protein, low fat meals more frequently and exercising 30 mins a day 5 times a week with a goal of 10-15lb weight loss in the next 3 months. Patient voiced their understanding and motivation to adhere to these recommendations.  

## 2020-02-02 NOTE — Assessment & Plan Note (Addendum)
Chronic, ongoing.  Continue current medication regimen, Remeron, and adjust as needed.  Denies SI/HI.  Refills sent in.  Return in 6 months for follow-up.

## 2020-02-02 NOTE — Assessment & Plan Note (Signed)
Chronic, ongoing.  Continue daily supplement and adjust as needed.  Last DEXA 2020.  Check Vit D level today.

## 2020-02-02 NOTE — Progress Notes (Signed)
BP 127/85    Pulse 79    Temp 98.1 F (36.7 C) (Oral)    Ht 5' 3.5" (1.613 m)    Wt 181 lb (82.1 kg)    SpO2 96%    BMI 31.56 kg/m    Subjective:    Patient ID: Rachel Cox, female    DOB: 1951/03/07, 69 y.o.   MRN: [id number]  HPI: Rachel Cox is a 69 y.o. female presenting on 02/02/2020 for comprehensive medical examination. Current medical complaints include:none  She currently lives with: husband Menopausal Symptoms: no   SEIZURE DISORDER Followed by Dr. Melrose Nakayama, recently saw on Feb 15th, 2021.  Has had no seizure since 2016. Keppra 250 MG at bedtime  HYPERTENSION / HYPERLIPIDEMIA Continues on Losartan, HCTZ and Atorvastatin.   Satisfied with current treatment? yes Duration of hypertension: chronic BP monitoring frequency: once a week BP range: 130/70-80 BP medication side effects: yes Duration of hyperlipidemia: chronic Cholesterol medication side effects: no Cholesterol supplements: none Medication compliance: good compliance Aspirin: no Recent stressors: no Recurrent headaches: no Visual changes: no Palpitations: no Dyspnea: no Chest pain: no Lower extremity edema: no Dizzy/lightheaded: no   ANEMIA In March ferritin level 134, iron 71, H/H 14.3/43.2, MCV 88, B12 864.Takes B12 and multivitamin daily at this time. Anemia status:stable Etiology of anemia: Duration of anemia treatment:  Compliance with treatment:good compliance Iron supplementation side effects:no Severity of anemia:mild Fatigue:no Decreased exercise tolerance:no Dyspnea on exertion:no Palpitations:no Bleeding:no Pica:no  CHRONIC KIDNEY DISEASE Last GFR 67 and CRT 0.88 -- March 2021.  Has underlying osteopenia noted on DEXA August 2020, continues on daily supplements. CKD status: stable Medications renally dose: yes Previous renal evaluation: no Pneumovax:  Up to Date Influenza Vaccine:  Up to Date   HYPOTHYROIDISM Last TSH 3.00 and T4 1.23 -- followed by Dr. Lissa Merlin  and last seen 10/19/19. Thyroid control status:stable Satisfied with current treatment? yes Medication side effects: no Medication compliance: good compliance Etiology of hypothyroidism:  Recent dose adjustment:no Fatigue: no Cold intolerance: no Heat intolerance: no Weight gain: no Weight loss: no Constipation: no Diarrhea/loose stools: no Palpitations: no Lower extremity edema: no Anxiety/depressed mood: no   ANXIETY/STRESS Continues on Remeron 30 MG for mood and insomnia, reports this works well and sleeps like a baby.  Duration:stable Anxious mood: no  Excessive worrying: no Irritability: no  Sweating: no Nausea: no Palpitations:no Hyperventilation: no Panic attacks: no Agoraphobia: no  Obscessions/compulsions: no Depressed mood: no Depression screen [facility] 2/9 02/02/2020 10/13/2019 08/02/2019 02/01/2019 01/04/2019  Decreased Interest 0 0 1 0 0  Down, Depressed, Hopeless 0 0 1 0 0  PHQ - 2 Score 0 0 2 0 0  Altered sleeping 0 - 1 - 0  Tired, decreased energy 0 - 1 - 0  Change in appetite 0 - 0 - 0  Feeling bad or failure about yourself  0 - 0 - 0  Trouble concentrating 0 - 0 - 0  Moving slowly or fidgety/restless 0 - 0 - 0  Suicidal thoughts 0 - 0 - 0  PHQ-9 Score 0 - 4 - 0  Difficult doing work/chores Not difficult at all - Not difficult at all - Not difficult at all  Some recent data might be hidden    The patient does not have a history of falls. I did not complete a risk assessment for falls. A plan of care for falls was not documented.   Past Medical History:  Past Medical History:  Diagnosis Date  Allergic rhinitis    Allergy    Anemia    Anxiety    Depression    Epilepsy ([place])    managed by Dr. Rexene Alberts 2015/2016   GERD (gastroesophageal reflux disease)    History of abnormal cervical Pap smear 1980's   Hyperglycemia    Hyperlipidemia    Hypertension    Hypothyroidism    Insomnia    Obesity (BMI 30.0-34.9) 01/06/2017    Osteopenia    Reflux esophagitis 10/31/2017   EGD    Surgical History:  Past Surgical History:  Procedure Laterality Date   COLONOSCOPY  Sept 2015   COLONOSCOPY WITH PROPOFOL N/A 09/10/2017   Procedure: COLONOSCOPY WITH PROPOFOL;  Surgeon: Robert Bellow, MD;  Location: [facility] ENDOSCOPY;  Service: Endoscopy;  Laterality: N/A;   Cryo Procedure  1980s   for abnormal pap   ESOPHAGOGASTRODUODENOSCOPY (EGD) WITH PROPOFOL N/A 09/10/2017   Procedure: ESOPHAGOGASTRODUODENOSCOPY (EGD) WITH PROPOFOL;  Surgeon: Robert Bellow, MD;  Location: [facility] ENDOSCOPY;  Service: Endoscopy;  Laterality: N/A;   ESOPHAGOGASTRODUODENOSCOPY (EGD) WITH PROPOFOL N/A 10/29/2017   Procedure: ESOPHAGOGASTRODUODENOSCOPY (EGD) WITH PROPOFOL;  Surgeon: Robert Bellow, MD;  Location: [facility] ENDOSCOPY;  Service: Endoscopy;  Laterality: N/A;   LIPOMA EXCISION N/A 12/12/2017   Procedure: EXCISION BACK LIPOMA;  Surgeon: Robert Bellow, MD;  Location: [facility] ORS;  Service: General;  Laterality: N/A;   TUBAL LIGATION  1969    Medications:  Current Outpatient Medications on File Prior to Visit  Medication Sig   folic acid (FOLVITE) 295 MCG tablet Take 400 mcg by mouth daily.   levETIRAcetam (KEPPRA) 250 MG tablet Take 250 mg by mouth at bedtime.    levothyroxine (SYNTHROID) 75 MCG tablet Take 1 tablet (75 mcg total) by mouth daily. Take one tablet every morning   Multiple Vitamin (MULTIVITAMIN) tablet Take 1 tablet by mouth 3 (three) times a week.    vitamin B-12 (CYANOCOBALAMIN) 500 MCG tablet Take 500 mcg by mouth daily.   No current facility-administered medications on file prior to visit.    Allergies:  Allergies  Allergen Reactions   Aspirin Nausea Only   Codeine Palpitations    Pt vocalized    Social History:  Social History   Socioeconomic History   Marital status: Married    Spouse name: Girard Cooter   Number of children: 4   Years of education: Not on file   Highest education  level: 10th grade  Occupational History   Occupation: Retired  Tobacco Use   Smoking status: Never Smoker   Smokeless tobacco: Never Used  Scientific laboratory technician Use: Never used  Substance and Sexual Activity   Alcohol use: Yes    Comment: wine occ   Drug use: No   Sexual activity: Not Currently    Birth control/protection: Post-menopausal  Other Topics Concern   Not on file  Social History Narrative   Not on file   Social Determinants of Health   Financial Resource Strain: Low Risk    Difficulty of Paying Living Expenses: Not hard at all  Food Insecurity: No Food Insecurity   Worried About Charity fundraiser in the Last Year: Never true   [place] in the Last Year: Never true  Transportation Needs: No Transportation Needs   Lack of Transportation (Medical): No   Lack of Transportation (Non-Medical): No  Physical Activity: Inactive   Days of Exercise per Week: 0 days   Minutes of Exercise per Session: 0 min  Stress: No Stress Concern Present   Feeling of Stress : Not at all  Social Connections: Socially Integrated   Frequency of Communication with Friends and Family: More than three times a week   Frequency of Social Gatherings with Friends and Family: More than three times a week   Attends Religious Services: More than 4 times per year   Active Member of [employer] or Organizations: Yes   Attends Music therapist: More than 4 times per year   Marital Status: Married  Human resources officer Violence: Not At Risk   Fear of Current or Ex-Partner: No   Emotionally Abused: No   Physically Abused: No   Sexually Abused: No   Social History   Tobacco Use  Smoking Status Never Smoker  Smokeless Tobacco Never Used   Social History   Substance and Sexual Activity  Alcohol Use Yes   Comment: wine occ    Family History:  Family History  Problem Relation Age of Onset   Hyperlipidemia Mother    Hypertension Mother    CAD Mother      Thyroid disease Mother    Heart disease Mother    COPD Father    Thyroid disease Father    Cancer Brother        liver   Alcohol abuse Brother    Hypertension Brother    Liver disease Brother    Thyroid disease Sister    Diabetes Neg Hx    Stroke Neg Hx     Past medical history, surgical history, medications, allergies, family history and social history reviewed with patient today and changes made to appropriate areas of the chart.   Review of Systems - negative All other ROS negative except what is listed above and in the HPI.      Objective:    BP 127/85    Pulse 79    Temp 98.1 F (36.7 C) (Oral)    Ht 5' 3.5" (1.613 m)    Wt 181 lb (82.1 kg)    SpO2 96%    BMI 31.56 kg/m   Wt Readings from Last 3 Encounters:  02/02/20 181 lb (82.1 kg)  10/19/19 180 lb (81.6 kg)  02/01/19 187 lb 9.6 oz (85.1 kg)    Physical Exam Constitutional:      General: She is awake. She is not in acute distress.    Appearance: She is well-developed. She is not ill-appearing.  HENT:     Head: Normocephalic and atraumatic.     Right Ear: Hearing, tympanic membrane, ear canal and external ear normal. No drainage.     Left Ear: Hearing, tympanic membrane, ear canal and external ear normal. No drainage.     Nose: Nose normal.     Right Sinus: No maxillary sinus tenderness or frontal sinus tenderness.     Left Sinus: No maxillary sinus tenderness or frontal sinus tenderness.     Mouth/Throat:     Mouth: Mucous membranes are moist.     Pharynx: Oropharynx is clear. Uvula midline. No pharyngeal swelling, oropharyngeal exudate or posterior oropharyngeal erythema.  Eyes:     General: Lids are normal.        Right eye: No discharge.        Left eye: No discharge.     Extraocular Movements: Extraocular movements intact.     Conjunctiva/sclera: Conjunctivae normal.     Pupils: Pupils are equal, round, and reactive to light.     Visual Fields: Right eye visual fields normal  and left eye  visual fields normal.  Neck:     Thyroid: No thyromegaly.     Vascular: No carotid bruit.     Trachea: Trachea normal.  Cardiovascular:     Rate and Rhythm: Normal rate and regular rhythm.     Heart sounds: Normal heart sounds. No murmur heard.  No gallop.   Pulmonary:     Effort: Pulmonary effort is normal. No accessory muscle usage or respiratory distress.     Breath sounds: Normal breath sounds.  Abdominal:     General: Bowel sounds are normal.     Palpations: Abdomen is soft. There is no hepatomegaly or splenomegaly.     Tenderness: There is no abdominal tenderness.  Musculoskeletal:        General: Normal range of motion.     Cervical back: Normal range of motion and neck supple.     Right lower leg: No edema.     Left lower leg: No edema.  Lymphadenopathy:     Head:     Right side of head: No submental, submandibular, tonsillar, preauricular or posterior auricular adenopathy.     Left side of head: No submental, submandibular, tonsillar, preauricular or posterior auricular adenopathy.     Cervical: No cervical adenopathy.  Skin:    General: Skin is warm and dry.     Capillary Refill: Capillary refill takes less than 2 seconds.     Findings: No rash.  Neurological:     Mental Status: She is alert and oriented to person, place, and time.     Cranial Nerves: Cranial nerves are intact.     Gait: Gait is intact.     Deep Tendon Reflexes: Reflexes are normal and symmetric.     Reflex Scores:      Brachioradialis reflexes are 2+ on the right side and 2+ on the left side.      Patellar reflexes are 2+ on the right side and 2+ on the left side. Psychiatric:        Attention and Perception: Attention normal.        Mood and Affect: Mood normal.        Speech: Speech normal.        Behavior: Behavior normal. Behavior is cooperative.        Thought Content: Thought content normal.        Judgment: Judgment normal.     Results for orders placed or performed in visit on  10/19/19  TSH  Result Value Ref Range   TSH 3.00 0.35 - 4.50 uIU/mL  T4, free  Result Value Ref Range   Free T4 1.23 0.60 - 1.60 ng/dL      Assessment & Plan:   Problem List Items Addressed This Visit      Cardiovascular and Mediastinum   Essential hypertension    Chronic, ongoing with BP at goal for age today and on occasional home readings at goal.  Recommend continuing to check BP at home a few days a week + focus on DASH diet.  Continue current medication regimen and adjust as needed.  Obtain CMP today.  Return in 6 months for follow-up.  Refills sent as needed.      Relevant Medications   losartan (COZAAR) 100 MG tablet   hydrochlorothiazide (HYDRODIURIL) 25 MG tablet   atorvastatin (LIPITOR) 40 MG tablet   Other Relevant Orders   Comprehensive metabolic panel     Digestive   Reflux esophagitis    Chronic, ongoing, continues  stable on Protonix.  Will check Mag level today and continue regimen.  Consider trial reduction in future, if tolerates would benefit from time off medication.  If poor tolerance, will continue current dose.      Relevant Orders   Magnesium     Endocrine   Hypothyroidism    Chronic, ongoing.  Continue current medication regimen as recommended by Dr. Lissa Merlin and collaboration with endocrinology.          Nervous and Auditory   Epilepsy (HCC)    Chronic, stable.  Continue current medication regimen as recommended by neuro.  She reports they do not check Keppra level, will check today and check renal function for dosing purposes and to ensure no elevation in levels.      Relevant Orders   Levetiracetam level     Musculoskeletal and Integument   Osteopenia (Chronic)    Ongoing with DEXA last year, will attempt to obtain this report.  Continue Vit D daily and recommend fall precautions.  Check Vit D level.        Genitourinary   CKD (chronic kidney disease) stage 3, GFR 30-59 ml/min (Chronic)    Chronic, stable.  Avoid NSAIDs.  Continue  Losartan for kidney protection.  CMP today.      Relevant Orders   Comprehensive metabolic panel   Microalbumin, Urine Waived     Other   Hyperlipemia    Chronic, ongoing.  Continue current medication regimen and adjust as needed.  Lipid panel today. Return in 6 months for follow-up.  Refills sent in.      Relevant Medications   losartan (COZAAR) 100 MG tablet   hydrochlorothiazide (HYDRODIURIL) 25 MG tablet   atorvastatin (LIPITOR) 40 MG tablet   Other Relevant Orders   Comprehensive metabolic panel   Lipid Panel w/o Chol/HDL Ratio   Insomnia    Chronic, ongoing.  Continue current medication regimen, Remeron, and adjust as needed.        Depression    Chronic, ongoing.  Continue current medication regimen, Remeron, and adjust as needed.  Denies SI/HI.  Refills sent in.  Return in 6 months for follow-up.      Relevant Medications   mirtazapine (REMERON) 30 MG tablet   Anxiety    Chronic, stable.  Continue current medication regimen, Remeron, and adjust as needed.  May consider Buspar for anxiety.  Avoid benzo due to age.        Relevant Medications   mirtazapine (REMERON) 30 MG tablet   Vitamin D deficiency    Chronic, ongoing.  Continue daily supplement and adjust as needed.  Last DEXA 2020.  Check Vit D level today.      Relevant Orders   CBC with Differential/Platelet   VITAMIN D 25 Hydroxy (Vit-D Deficiency, Fractures)   Microalbuminuria    Continue Losartan for kidney protection, check urine ALB today.      Iron deficiency anemia    Check CBC, ferritin, iron, and B12 today.  Continue daily supplement and adjust plan of care as needed based on labs.      Relevant Orders   CBC with Differential/Platelet   Iron, TIBC and Ferritin Panel   Obesity    Recommended eating smaller high protein, low fat meals more frequently and exercising 30 mins a day 5 times a week with a goal of 10-15lb weight loss in the next 3 months. Patient voiced their understanding and  motivation to adhere to these recommendations.       B12  deficiency    Chronic, ongoing.  Continue daily supplement and adjust as needed.  Check B12 level today.      Relevant Orders   Vitamin B12    Other Visit Diagnoses    Routine general medical examination at a health care facility    -  Primary   Annual labs today to include CBC, CMP, TSH, lipid       Follow up plan: Return in about 6 months (around 08/01/2020) for HTN/HLD, Thyroid, Seizure, Mood.   LABORATORY TESTING:  - Pap smear: not applicable  IMMUNIZATIONS:   - Tdap: Tetanus vaccination status reviewed: refused. - Influenza: Up to date - Pneumovax: Up to date - Prevnar: Up to date - HPV: Not applicable - Zostavax vaccine: Up to date  SCREENING: -Mammogram: Up to date  - Colonoscopy: Up to date  - Bone Density: Up to date  -Hearing Test: Not applicable  -Spirometry: Not applicable   PATIENT COUNSELING:   Advised to take 1 mg of folate supplement per day if capable of pregnancy.   Sexuality: Discussed sexually transmitted diseases, partner selection, use of condoms, avoidance of unintended pregnancy  and contraceptive alternatives.   Advised to avoid cigarette smoking.  I discussed with the patient that most people either abstain from alcohol or drink within safe limits (<=14/week and <=4 drinks/occasion for males, <=7/weeks and <= 3 drinks/occasion for females) and that the risk for alcohol disorders and other health effects rises proportionally with the number of drinks per week and how often a drinker exceeds daily limits.  Discussed cessation/primary prevention of drug use and availability of treatment for abuse.   Diet: Encouraged to adjust caloric intake to maintain  or achieve ideal body weight, to reduce intake of dietary saturated fat and total fat, to limit sodium intake by avoiding high sodium foods and not adding table salt, and to maintain adequate dietary potassium and calcium preferably from  fresh fruits, vegetables, and low-fat dairy products.    stressed the importance of regular exercise  Injury prevention: Discussed safety belts, safety helmets, smoke detector, smoking near bedding or upholstery.   Dental health: Discussed importance of regular tooth brushing, flossing, and dental visits.    NEXT PREVENTATIVE PHYSICAL DUE IN 1 YEAR. Return in about 6 months (around 08/01/2020) for HTN/HLD, Thyroid, Seizure, Mood.

## 2020-02-02 NOTE — Assessment & Plan Note (Signed)
Chronic, stable.  Avoid NSAIDs.  Continue Losartan for kidney protection.  CMP today. 

## 2020-02-02 NOTE — Assessment & Plan Note (Signed)
Chronic, ongoing, continues stable on Protonix.  Will check Mag level today and continue regimen.  Consider trial reduction in future, if tolerates would benefit from time off medication.  If poor tolerance, will continue current dose.

## 2020-02-02 NOTE — Assessment & Plan Note (Signed)
Chronic, ongoing.  Continue daily supplement and adjust as needed.  Check B12 level today. 

## 2020-02-03 NOTE — Progress Notes (Signed)
Contacted via [place] morning Candis, your labs have returned.  I am only waiting on Keppra level and will let you know when this returns.  Overall labs look good, ferritin is a little low and iron in middle range, as we discussed it may not hurt to take one iron pill daily.  Any questions? Keep being awesome!!  Thank you for allowing me to participate in your care. Kindest regards, Nikesh Teschner

## 2020-02-06 LAB — COMPREHENSIVE METABOLIC PANEL
ALT: 16 IU/L (ref 0–32)
AST: 22 IU/L (ref 0–40)
Albumin/Globulin Ratio: 1.3 (ref 1.2–2.2)
Albumin: 4.2 g/dL (ref 3.7–4.7)
Alkaline Phosphatase: 98 IU/L (ref 48–121)
BUN/Creatinine Ratio: 16 (ref 12–28)
BUN: 15 mg/dL (ref 8–27)
Bilirubin Total: 0.7 mg/dL (ref 0.0–1.2)
CO2: 26 mmol/L (ref 20–29)
Calcium: 10 mg/dL (ref 8.7–10.3)
Chloride: 102 mmol/L (ref 96–106)
Creatinine, Ser: 0.93 mg/dL (ref 0.57–1.00)
GFR calc Af Amer: 72 mL/min/{1.73_m2} (ref >59)
GFR calc non Af Amer: 62 mL/min/{1.73_m2} (ref >59)
Globulin, Total: 3.2 g/dL (ref 1.5–4.5)
Glucose: 95 mg/dL (ref 65–99)
Potassium: 3.7 mmol/L (ref 3.5–5.2)
Sodium: 141 mmol/L (ref 134–144)
Total Protein: 7.4 g/dL (ref 6.0–8.5)

## 2020-02-06 LAB — CBC WITH DIFFERENTIAL/PLATELET
Basophils Absolute: 0 10*3/uL (ref 0.0–0.2)
Basos: 1 %
EOS (ABSOLUTE): 0.1 10*3/uL (ref 0.0–0.4)
Eos: 2 %
Hematocrit: 47.1 % — ABNORMAL HIGH (ref 34.0–46.6)
Hemoglobin: 14.6 g/dL (ref 11.1–15.9)
Immature Grans (Abs): 0 10*3/uL (ref 0.0–0.1)
Immature Granulocytes: 0 %
Lymphocytes Absolute: 1.4 10*3/uL (ref 0.7–3.1)
Lymphs: 30 %
MCH: 27.9 pg (ref 26.6–33.0)
MCHC: 31 g/dL — ABNORMAL LOW (ref 31.5–35.7)
MCV: 90 fL (ref 79–97)
Monocytes Absolute: 0.7 10*3/uL (ref 0.1–0.9)
Monocytes: 15 %
Neutrophils Absolute: 2.4 10*3/uL (ref 1.4–7.0)
Neutrophils: 52 %
Platelets: 307 10*3/uL (ref 150–450)
RBC: 5.23 x10E6/uL (ref 3.77–5.28)
RDW: 14 % (ref 11.7–15.4)
WBC: 4.6 10*3/uL (ref 3.4–10.8)

## 2020-02-06 LAB — LIPID PANEL W/O CHOL/HDL RATIO
Cholesterol, Total: 189 mg/dL (ref 100–199)
HDL: 75 mg/dL (ref >39)
LDL Chol Calc (NIH): 93 mg/dL (ref 0–99)
Triglycerides: 118 mg/dL (ref 0–149)
VLDL Cholesterol Cal: 21 mg/dL (ref 5–40)

## 2020-02-06 LAB — IRON,TIBC AND FERRITIN PANEL
Ferritin: 11 ng/mL — ABNORMAL LOW (ref 15–150)
Iron Saturation: 20 % (ref 15–55)
Iron: 71 ug/dL (ref 27–139)
Total Iron Binding Capacity: 360 ug/dL (ref 250–450)
UIBC: 289 ug/dL (ref 118–369)

## 2020-02-06 LAB — LEVETIRACETAM LEVEL: Levetiracetam Lvl: 6.2 ug/mL — ABNORMAL LOW (ref 10.0–40.0)

## 2020-02-06 LAB — MAGNESIUM: Magnesium: 1.9 mg/dL (ref 1.6–2.3)

## 2020-02-06 LAB — VITAMIN D 25 HYDROXY (VIT D DEFICIENCY, FRACTURES): Vit D, 25-Hydroxy: 34.7 ng/mL (ref 30.0–100.0)

## 2020-02-06 LAB — VITAMIN B12: Vitamin B-12: 1125 pg/mL (ref 232–1245)

## 2020-02-07 NOTE — Progress Notes (Signed)
Contacted via [place] afternoon Awanda, your Keppra level is a little on low side, but we will maintain current seizure medication dosing as is beneficial at this time.  We watch this to ensure level does not get too high.  Magnesium level is normal.  Have a great day!!

## 2020-02-08 DIAGNOSIS — H25043 Posterior subcapsular polar age-related cataract, bilateral: Secondary | ICD-10-CM | POA: Diagnosis not present

## 2020-02-08 DIAGNOSIS — H2513 Age-related nuclear cataract, bilateral: Secondary | ICD-10-CM | POA: Diagnosis not present

## 2020-02-08 DIAGNOSIS — H25013 Cortical age-related cataract, bilateral: Secondary | ICD-10-CM | POA: Diagnosis not present

## 2020-02-08 DIAGNOSIS — H40023 Open angle with borderline findings, high risk, bilateral: Secondary | ICD-10-CM | POA: Diagnosis not present

## 2020-02-08 DIAGNOSIS — H18413 Arcus senilis, bilateral: Secondary | ICD-10-CM | POA: Diagnosis not present

## 2020-02-08 DIAGNOSIS — H2512 Age-related nuclear cataract, left eye: Secondary | ICD-10-CM | POA: Diagnosis not present

## 2020-02-21 ENCOUNTER — Ambulatory Visit: Payer: PPO

## 2020-03-01 ENCOUNTER — Ambulatory Visit (INDEPENDENT_AMBULATORY_CARE_PROVIDER_SITE_OTHER): Payer: PPO | Admitting: General Practice

## 2020-03-01 ENCOUNTER — Telehealth: Payer: Self-pay | Admitting: General Practice

## 2020-03-01 DIAGNOSIS — D508 Other iron deficiency anemias: Secondary | ICD-10-CM | POA: Diagnosis not present

## 2020-03-01 DIAGNOSIS — I10 Essential (primary) hypertension: Secondary | ICD-10-CM

## 2020-03-01 DIAGNOSIS — E039 Hypothyroidism, unspecified: Secondary | ICD-10-CM

## 2020-03-01 DIAGNOSIS — N1831 Chronic kidney disease, stage 3a: Secondary | ICD-10-CM

## 2020-03-01 DIAGNOSIS — E782 Mixed hyperlipidemia: Secondary | ICD-10-CM | POA: Diagnosis not present

## 2020-03-01 NOTE — Patient Instructions (Signed)
Visit Information  Goals Addressed              This Visit's Progress   .  RNCM: Pt-"I take my blood pressure every day" (pt-stated)        CARE PLAN ENTRY (see longtitudinal plan of care for additional care plan information)  Current Barriers:  . Chronic Disease Management support, education, and care coordination needs related to HTN, HLD, and CKD Stage 3  Clinical Goal(s) related to HTN, HLD, and CKD Stage 3 :  Over the next 120 days, patient will:  . Work with the care management team to address educational, disease management, and care coordination needs  . Begin or continue self health monitoring activities as directed today Measure and record blood pressure 3 times per week and adhere to a heart healthy diet  . Call provider office for new or worsened signs and symptoms Blood pressure findings outside established parameters, Shortness of breath, and New or worsened symptom related to HLD/CKD and other chronic conditions.  . Call care management team with questions or concerns . Verbalize basic understanding of patient centered plan of care established today  Interventions related to HTN, HLD, and CKD Stage 3 :  . Evaluation of current treatment plans and patient's adherence to plan as established by provider.  The patient verbalized understanding of the plan of care. The patient saw the MD for her thyroid follow up recently. Her thyroid condition is stable. 03-01-2020: The patient states she is feeling great. She goes on 03-13-2020 to have her right cataract removed.  She sees her specialist for her thyroid management and lab work indicated that [place] level was low but she is confident in her health care team and is managing well.  . Assessed patient understanding of disease states.  The patient has a good understanding of her chronic conditions. The patient takes her blood pressures at home. Average is 130/80. The patients was elevated at the specialist office.  The patient says she  doesn't know why but she does have the "white coat syndrome" when going to the doctor. Will continue to monitor. 03-01-2020: Her blood pressures remain stable. The patient takes at home. No issues noted.  . Assessed patient's education and care coordination needs.  Patient denies any new concerns at this time. Will continue to monitor.  . Provided disease specific education to patient. Evaluation of heart healthy diet. The patient states she does not always do a good job at this but she is trying to do better. Discussed fresh fruits and vegetables. The patient is enjoying seasonal foods. Education and support. 03-01-2020: The patient states that she is taking an iron supplement now. Denies any issues with the supplement. The patient also states that her husband was on the phone with the health department attempting to get an appointment for the COVID booster vaccine. The patient denies any issues at this time. Will continue to monitor.  Nash Dimmer with appropriate clinical care team members regarding patient needs.  Denies any needs from the LCSW or pharmacist at this time. Will continue to monitor.   Patient Self Care Activities related to HTN, HLD, and CKD Stage 3 :  . Patient is unable to independently self-manage chronic health conditions  Please see past updates related to this goal by clicking on the "Past Updates" button in the selected goal         Patient verbalizes understanding of instructions provided today.   Telephone follow up appointment with care management team member  scheduled for: 05-31-2020 at 0900 am  Noreene Larsson RN, MSN, [facility] Mobile: [phone]

## 2020-03-01 NOTE — Chronic Care Management (AMB) (Signed)
Chronic Care Management   Follow Up Note   03/01/2020 Name: Rachel Cox MRN: [id number] DOB: [birth year]  Referred by: Venita Lick, NP Reason for referral : Chronic Care Management (RNCM Chronic Disease Managment and Care Coordination Needs )   Rachel Cox is a 69 y.o. year old female who is a primary care patient of Cannady, Barbaraann Faster, NP. The CCM team was consulted for assistance with chronic disease management and care coordination needs.    Review of patient status, including review of consultants reports, relevant laboratory and other test results, and collaboration with appropriate care team members and the patient's provider was performed as part of comprehensive patient evaluation and provision of chronic care management services.    SDOH (Social Determinants of Health) assessments performed: Yes See Care Plan activities for detailed interventions related to [facility])     Outpatient Encounter Medications as of 03/01/2020  Medication Sig   atorvastatin (LIPITOR) 40 MG tablet Take 1 tablet (40 mg total) by mouth at bedtime.   Cholecalciferol 25 MCG (1000 UT) tablet Take 1 tablet (1,000 Units total) by mouth daily.   fluticasone (FLONASE) 50 MCG/ACT nasal spray Place 2 sprays into both nostrils daily as needed for allergies.   folic acid (FOLVITE) 093 MCG tablet Take 400 mcg by mouth daily.   hydrochlorothiazide (HYDRODIURIL) 25 MG tablet Take 1 tablet (25 mg total) by mouth daily.   levETIRAcetam (KEPPRA) 250 MG tablet Take 250 mg by mouth at bedtime.    levothyroxine (SYNTHROID) 75 MCG tablet Take 1 tablet (75 mcg total) by mouth daily. Take one tablet every morning   losartan (COZAAR) 100 MG tablet Take 1 tablet (100 mg total) by mouth daily.   mirtazapine (REMERON) 30 MG tablet Take 1 tablet (30 mg total) by mouth every morning.   Multiple Vitamin (MULTIVITAMIN) tablet Take 1 tablet by mouth 3 (three) times a week.    pantoprazole (PROTONIX) 40 MG tablet Take 1  tablet (40 mg total) by mouth daily.   vitamin B-12 (CYANOCOBALAMIN) 500 MCG tablet Take 500 mcg by mouth daily.   No facility-administered encounter medications on file as of 03/01/2020.     Objective:  BP Readings from Last 3 Encounters:  02/02/20 127/85  10/19/19 (!) 154/86  08/02/19 127/82    Goals Addressed              This Visit's Progress     RNCM: Pt-"I take my blood pressure every day" (pt-stated)        CARE PLAN ENTRY (see longtitudinal plan of care for additional care plan information)  Current Barriers:   Chronic Disease Management support, education, and care coordination needs related to HTN, HLD, and CKD Stage 3  Clinical Goal(s) related to HTN, HLD, and CKD Stage 3 :  Over the next 120 days, patient will:   Work with the care management team to address educational, disease management, and care coordination needs   Begin or continue self health monitoring activities as directed today Measure and record blood pressure 3 times per week and adhere to a heart healthy diet   Call provider office for new or worsened signs and symptoms Blood pressure findings outside established parameters, Shortness of breath, and New or worsened symptom related to HLD/CKD and other chronic conditions.   Call care management team with questions or concerns  Verbalize basic understanding of patient centered plan of care established today  Interventions related to HTN, HLD, and CKD Stage 3 :  Evaluation of current treatment plans and patient's adherence to plan as established by provider.  The patient verbalized understanding of the plan of care. The patient saw the MD for her thyroid follow up recently. Her thyroid condition is stable. 03-01-2020: The patient states she is feeling great. She goes on 03-13-2020 to have her right cataract removed.  She sees her specialist for her thyroid management and lab work indicated that [address] level was low but she is confident in her health  care team and is managing well.   Assessed patient understanding of disease states.  The patient has a good understanding of her chronic conditions. The patient takes her blood pressures at home. Average is 130/80. The patients was elevated at the specialist office.  The patient says she doesn't know why but she does have the "white coat syndrome" when going to the doctor. Will continue to monitor. 03-01-2020: Her blood pressures remain stable. The patient takes at home. No issues noted.   Assessed patient's education and care coordination needs.  Patient denies any new concerns at this time. Will continue to monitor.   Provided disease specific education to patient. Evaluation of heart healthy diet. The patient states she does not always do a good job at this but she is trying to do better. Discussed fresh fruits and vegetables. The patient is enjoying seasonal foods. Education and support. 03-01-2020: The patient states that she is taking an iron supplement now. Denies any issues with the supplement. The patient also states that her husband was on the phone with the health department attempting to get an appointment for the COVID booster vaccine. The patient denies any issues at this time. Will continue to monitor.   Collaborated with appropriate clinical care team members regarding patient needs.  Denies any needs from the LCSW or pharmacist at this time. Will continue to monitor.   Patient Self Care Activities related to HTN, HLD, and CKD Stage 3 :   Patient is unable to independently self-manage chronic health conditions  Please see past updates related to this goal by clicking on the "Past Updates" button in the selected goal          Plan:   Telephone follow up appointment with care management team member scheduled for: 05-31-2020 at 0900   [place], MSN, [facility] Mobile: [phone]

## 2020-03-13 DIAGNOSIS — H2512 Age-related nuclear cataract, left eye: Secondary | ICD-10-CM | POA: Diagnosis not present

## 2020-03-14 DIAGNOSIS — H2511 Age-related nuclear cataract, right eye: Secondary | ICD-10-CM | POA: Diagnosis not present

## 2020-04-03 DIAGNOSIS — H2511 Age-related nuclear cataract, right eye: Secondary | ICD-10-CM | POA: Diagnosis not present

## 2020-05-05 ENCOUNTER — Telehealth: Payer: Self-pay

## 2020-05-05 NOTE — Telephone Encounter (Signed)
Chart updated

## 2020-05-05 NOTE — Telephone Encounter (Signed)
Copied from [place] [id number]. Topic: General - Inquiry >> May 05, 2020  3:28 PM Greggory Keen D wrote: Reason for CRM: Pt called saying she got a flu vaccine Sept 14 at [facility]

## 2020-05-31 ENCOUNTER — Telehealth: Payer: Self-pay

## 2020-05-31 ENCOUNTER — Telehealth: Payer: Self-pay | Admitting: General Practice

## 2020-05-31 NOTE — Telephone Encounter (Signed)
  Chronic Care Management   Outreach Note  05/31/2020 Name: Rachel Cox MRN: [id number] DOB: 04-16-1951  Referred by: Marjie Skiff, NP Reason for referral : Appointment (RNCM: Follow up for Chronic Disease Management and Care Coordination needs)   Rachel Cox is enrolled in a Managed Medicaid Health Plan: No  An unsuccessful telephone outreach was attempted today. The patient was referred to the case management team for assistance with care management and care coordination.   Follow Up Plan: A HIPAA compliant phone message was left for the patient providing contact information and requesting a return call.   Alto [place] RN, MSN, CCM Community Care Coordinator [place]  [facility] Mobile: [phone]

## 2020-06-08 NOTE — Telephone Encounter (Signed)
Patient has been rescheduled.

## 2020-06-09 DIAGNOSIS — Z1231 Encounter for screening mammogram for malignant neoplasm of breast: Secondary | ICD-10-CM | POA: Diagnosis not present

## 2020-07-07 ENCOUNTER — Ambulatory Visit (INDEPENDENT_AMBULATORY_CARE_PROVIDER_SITE_OTHER): Payer: PPO

## 2020-07-07 VITALS — Ht 64.5 in | Wt 180.0 lb

## 2020-07-07 DIAGNOSIS — Z0000 Encounter for general adult medical examination without abnormal findings: Secondary | ICD-10-CM

## 2020-07-07 NOTE — Patient Instructions (Signed)
Rachel Cox , Thank you for taking time to come for your Medicare Wellness Visit. I appreciate your ongoing commitment to your health goals. Please review the following plan we discussed and let me know if I can assist you in the future.   Screening recommendations/referrals: Colonoscopy: completed 09/10/2017, due 09/11/2022 Mammogram: completed 06/09/2020, due 06/09/2021 Bone Density: completed 2019 per patient Recommended yearly ophthalmology/optometry visit for glaucoma screening and checkup Recommended yearly dental visit for hygiene and checkup  Vaccinations: Influenza vaccine: completed 02/15/2020, due 01/01/2021 Pneumococcal vaccine: completed 07/04/2015 Tdap vaccine: due Shingles vaccine: discussed   Covid-19: 03/03/2020, 08/04/2019, 07/14/2019  Advanced directives: Advance directive discussed with you today.  Conditions/risks identified: none  Next appointment: Follow up in one year for your annual wellness visit    Preventive Care 65 Years and Older, Female Preventive care refers to lifestyle choices and visits with your health care provider that can promote health and wellness. What does preventive care include?  A yearly physical exam. This is also called an annual well check.  Dental exams once or twice a year.  Routine eye exams. Ask your health care provider how often you should have your eyes checked.  Personal lifestyle choices, including:  Daily care of your teeth and gums.  Regular physical activity.  Eating a healthy diet.  Avoiding tobacco and drug use.  Limiting alcohol use.  Practicing safe sex.  Taking low-dose aspirin every day.  Taking vitamin and mineral supplements as recommended by your health care provider. What happens during an annual well check? The services and screenings done by your health care provider during your annual well check will depend on your age, overall health, lifestyle risk factors, and family history of disease. Counseling   Your health care provider may ask you questions about your:  Alcohol use.  Tobacco use.  Drug use.  Emotional well-being.  Home and relationship well-being.  Sexual activity.  Eating habits.  History of falls.  Memory and ability to understand (cognition).  Work and work Statistician.  Reproductive health. Screening  You may have the following tests or measurements:  Height, weight, and BMI.  Blood pressure.  Lipid and cholesterol levels. These may be checked every 5 years, or more frequently if you are over 59 years old.  Skin check.  Lung cancer screening. You may have this screening every year starting at age 76 if you have a 30-pack-year history of smoking and currently smoke or have quit within the past 15 years.  Fecal occult blood test (FOBT) of the stool. You may have this test every year starting at age 72.  Flexible sigmoidoscopy or colonoscopy. You may have a sigmoidoscopy every 5 years or a colonoscopy every 10 years starting at age 64.  Hepatitis C blood test.  Hepatitis B blood test.  Sexually transmitted disease (STD) testing.  Diabetes screening. This is done by checking your blood sugar (glucose) after you have not eaten for a while (fasting). You may have this done every 1-3 years.  Bone density scan. This is done to screen for osteoporosis. You may have this done starting at age 77.  Mammogram. This may be done every 1-2 years. Talk to your health care provider about how often you should have regular mammograms. Talk with your health care provider about your test results, treatment options, and if necessary, the need for more tests. Vaccines  Your health care provider may recommend certain vaccines, such as:  Influenza vaccine. This is recommended every year.  Tetanus, diphtheria,  and acellular pertussis (Tdap, Td) vaccine. You may need a Td booster every 10 years.  Zoster vaccine. You may need this after age 61.  Pneumococcal 13-valent  conjugate (PCV13) vaccine. One dose is recommended after age 45.  Pneumococcal polysaccharide (PPSV23) vaccine. One dose is recommended after age 95. Talk to your health care provider about which screenings and vaccines you need and how often you need them. This information is not intended to replace advice given to you by your health care provider. Make sure you discuss any questions you have with your health care provider. Document Released: 06/16/2015 Document Revised: 02/07/2016 Document Reviewed: 03/21/2015 Elsevier Interactive Patient Education  2017 [place] Prevention in the Home Falls can cause injuries. They can happen to people of all ages. There are many things you can do to make your home safe and to help prevent falls. What can I do on the outside of my home?  Regularly fix the edges of walkways and driveways and fix any cracks.  Remove anything that might make you trip as you walk through a door, such as a raised step or threshold.  Trim any bushes or trees on the path to your home.  Use bright outdoor lighting.  Clear any walking paths of anything that might make someone trip, such as rocks or tools.  Regularly check to see if handrails are loose or broken. Make sure that both sides of any steps have handrails.  Any raised decks and porches should have guardrails on the edges.  Have any leaves, snow, or ice cleared regularly.  Use sand or salt on walking paths during winter.  Clean up any spills in your garage right away. This includes oil or grease spills. What can I do in the bathroom?  Use night lights.  Install grab bars by the toilet and in the tub and shower. Do not use towel bars as grab bars.  Use non-skid mats or decals in the tub or shower.  If you need to sit down in the shower, use a plastic, non-slip stool.  Keep the floor dry. Clean up any water that spills on the floor as soon as it happens.  Remove soap buildup in the tub or  shower regularly.  Attach bath mats securely with double-sided non-slip rug tape.  Do not have throw rugs and other things on the floor that can make you trip. What can I do in the bedroom?  Use night lights.  Make sure that you have a light by your bed that is easy to reach.  Do not use any sheets or blankets that are too big for your bed. They should not hang down onto the floor.  Have a firm chair that has side arms. You can use this for support while you get dressed.  Do not have throw rugs and other things on the floor that can make you trip. What can I do in the kitchen?  Clean up any spills right away.  Avoid walking on wet floors.  Keep items that you use a lot in easy-to-reach places.  If you need to reach something above you, use a strong step stool that has a grab bar.  Keep electrical cords out of the way.  Do not use floor polish or wax that makes floors slippery. If you must use wax, use non-skid floor wax.  Do not have throw rugs and other things on the floor that can make you trip. What can I do with my  stairs?  Do not leave any items on the stairs.  Make sure that there are handrails on both sides of the stairs and use them. Fix handrails that are broken or loose. Make sure that handrails are as long as the stairways.  Check any carpeting to make sure that it is firmly attached to the stairs. Fix any carpet that is loose or worn.  Avoid having throw rugs at the top or bottom of the stairs. If you do have throw rugs, attach them to the floor with carpet tape.  Make sure that you have a light switch at the top of the stairs and the bottom of the stairs. If you do not have them, ask someone to add them for you. What else can I do to help prevent falls?  Wear shoes that:  Do not have high heels.  Have rubber bottoms.  Are comfortable and fit you well.  Are closed at the toe. Do not wear sandals.  If you use a stepladder:  Make sure that it is fully  opened. Do not climb a closed stepladder.  Make sure that both sides of the stepladder are locked into place.  Ask someone to hold it for you, if possible.  Clearly mark and make sure that you can see:  Any grab bars or handrails.  First and last steps.  Where the edge of each step is.  Use tools that help you move around (mobility aids) if they are needed. These include:  Canes.  Walkers.  Scooters.  Crutches.  Turn on the lights when you go into a Cox area. Replace any light bulbs as soon as they burn out.  Set up your furniture so you have a clear path. Avoid moving your furniture around.  If any of your floors are uneven, fix them.  If there are any pets around you, be aware of where they are.  Review your medicines with your doctor. Some medicines can make you feel dizzy. This can increase your chance of falling. Ask your doctor what other things that you can do to help prevent falls. This information is not intended to replace advice given to you by your health care provider. Make sure you discuss any questions you have with your health care provider. Document Released: 03/16/2009 Document Revised: 10/26/2015 Document Reviewed: 06/24/2014 Elsevier Interactive Patient Education  2017 [employer].

## 2020-07-07 NOTE — Progress Notes (Signed)
I connected with Rachel Cox today by telephone and verified that I am speaking with the correct person using two identifiers. Location patient: home Location provider: work Persons participating in the virtual visit: Rachel Cox, Glenna Durand LPN.   I discussed the limitations, risks, security and privacy concerns of performing an evaluation and management service by telephone and the availability of in person appointments. I also discussed with the patient that there may be a patient responsible charge related to this service. The patient expressed understanding and verbally consented to this telephonic visit.    Interactive audio and video telecommunications were attempted between this provider and patient, however failed, due to patient having technical difficulties OR patient did not have access to video capability.  We continued and completed visit with audio only.     Vital signs may be patient reported or missing.  Subjective:   Rachel Cox is a 71 y.o. female who presents for Medicare Annual (Subsequent) preventive examination.  Review of Systems     Cardiac Risk Factors include: advanced age (>8men, >18 women);hypertension;obesity (BMI >30kg/m2);sedentary lifestyle     Objective:    Today's Vitals   07/07/20 0811  Weight: 180 lb (81.6 kg)  Height: 5' 4.5" (1.638 m)   Body mass index is 30.42 kg/m.  Advanced Directives 07/07/2020 02/01/2019 12/12/2017 12/05/2017 11/14/2017 10/29/2017 09/10/2017  Does Patient Have a Medical Advance Directive? No No No No No No Yes;No  Would patient like information on creating a medical advance directive? - Yes (MAU/Ambulatory/Procedural Areas - Information given) No - Patient declined - Yes (MAU/Ambulatory/Procedural Areas - Information given) - No - Patient declined  Pre-existing out of facility DNR order (yellow form or pink MOST form) - - - - - Physician notified to receive inpatient order -    Current Medications (verified) Outpatient  Encounter Medications as of 07/07/2020  Medication Sig  . atorvastatin (LIPITOR) 40 MG tablet Take 1 tablet (40 mg total) by mouth at bedtime.  . Cholecalciferol 25 MCG (1000 UT) tablet Take 1 tablet (1,000 Units total) by mouth daily.  . folic acid (FOLVITE) 824 MCG tablet Take 400 mcg by mouth daily.  . hydrochlorothiazide (HYDRODIURIL) 25 MG tablet Take 1 tablet (25 mg total) by mouth daily.  Marland Kitchen levETIRAcetam (KEPPRA) 250 MG tablet Take 250 mg by mouth at bedtime.   Marland Kitchen levothyroxine (SYNTHROID) 75 MCG tablet Take 1 tablet (75 mcg total) by mouth daily. Take one tablet every morning  . losartan (COZAAR) 100 MG tablet Take 1 tablet (100 mg total) by mouth daily.  . mirtazapine (REMERON) 30 MG tablet Take 1 tablet (30 mg total) by mouth every morning.  . Multiple Vitamin (MULTIVITAMIN) tablet Take 1 tablet by mouth 3 (three) times a week.   . pantoprazole (PROTONIX) 40 MG tablet Take 1 tablet (40 mg total) by mouth daily.  . vitamin B-12 (CYANOCOBALAMIN) 500 MCG tablet Take 500 mcg by mouth daily.  . fluticasone (FLONASE) 50 MCG/ACT nasal spray Place 2 sprays into both nostrils daily as needed for allergies. (Patient not taking: Reported on 07/07/2020)   No facility-administered encounter medications on file as of 07/07/2020.    Allergies (verified) Aspirin and Codeine   History: Past Medical History:  Diagnosis Date  . Allergic rhinitis   . Allergy   . Anemia   . Anxiety   . Depression   . Epilepsy ([place])    managed by Dr. Rexene Alberts 2015/2016  . GERD (gastroesophageal reflux disease)   . History of abnormal  cervical Pap smear 1980's  . Hyperglycemia   . Hyperlipidemia   . Hypertension   . Hypothyroidism   . Insomnia   . Obesity (BMI 30.0-34.9) 01/06/2017  . Osteopenia   . Reflux esophagitis 10/31/2017   EGD   Past Surgical History:  Procedure Laterality Date  . COLONOSCOPY  Sept 2015  . COLONOSCOPY WITH PROPOFOL N/A 09/10/2017   Procedure: COLONOSCOPY WITH PROPOFOL;   Surgeon: Robert Bellow, MD;  Location: [facility] ENDOSCOPY;  Service: Endoscopy;  Laterality: N/A;  . Cryo Procedure  1980s   for abnormal pap  . ESOPHAGOGASTRODUODENOSCOPY (EGD) WITH PROPOFOL N/A 09/10/2017   Procedure: ESOPHAGOGASTRODUODENOSCOPY (EGD) WITH PROPOFOL;  Surgeon: Robert Bellow, MD;  Location: [facility] ENDOSCOPY;  Service: Endoscopy;  Laterality: N/A;  . ESOPHAGOGASTRODUODENOSCOPY (EGD) WITH PROPOFOL N/A 10/29/2017   Procedure: ESOPHAGOGASTRODUODENOSCOPY (EGD) WITH PROPOFOL;  Surgeon: Robert Bellow, MD;  Location: [facility] ENDOSCOPY;  Service: Endoscopy;  Laterality: N/A;  . EYE SURGERY Bilateral 2021   cataract removal  . LIPOMA EXCISION N/A 12/12/2017   Procedure: EXCISION BACK LIPOMA;  Surgeon: Robert Bellow, MD;  Location: [facility] ORS;  Service: General;  Laterality: N/A;  . TUBAL LIGATION  1969   Family History  Problem Relation Age of Onset  . Hyperlipidemia Mother   . Hypertension Mother   . CAD Mother   . Thyroid disease Mother   . Heart disease Mother   . COPD Father   . Thyroid disease Father   . Cancer Brother        liver  . Alcohol abuse Brother   . Hypertension Brother   . Liver disease Brother   . Thyroid disease Sister   . Diabetes Neg Hx   . Stroke Neg Hx    Social History   Socioeconomic History  . Marital status: Married    Spouse name: Girard Cooter  . Number of children: 4  . Years of education: Not on file  . Highest education level: 10th grade  Occupational History  . Occupation: Retired  Tobacco Use  . Smoking status: Never Smoker  . Smokeless tobacco: Never Used  Vaping Use  . Vaping Use: Never used  Substance and Sexual Activity  . Alcohol use: Yes    Comment: wine occ  . Drug use: No  . Sexual activity: Not Currently    Birth control/protection: Post-menopausal  Other Topics Concern  . Not on file  Social History Narrative  . Not on file   Social Determinants of Health   Financial Resource Strain: Low Risk   . Difficulty  of Paying Living Expenses: Not hard at all  Food Insecurity: No Food Insecurity  . Worried About Charity fundraiser in the Last Year: Never true  . Ran Out of Food in the Last Year: Never true  Transportation Needs: No Transportation Needs  . Lack of Transportation (Medical): No  . Lack of Transportation (Non-Medical): No  Physical Activity: Inactive  . Days of Exercise per Week: 0 days  . Minutes of Exercise per Session: 0 min  Stress: No Stress Concern Present  . Feeling of Stress : Not at all  Social Connections: Socially Integrated  . Frequency of Communication with Friends and Family: More than three times a week  . Frequency of Social Gatherings with Friends and Family: More than three times a week  . Attends Religious Services: More than 4 times per year  . Active Member of Clubs or Organizations: Yes  . Attends Archivist Meetings: More  than 4 times per year  . Marital Status: Married    Tobacco Counseling Counseling given: Not Answered   Clinical Intake:  Pre-visit preparation completed: Yes  Pain : No/denies pain     Nutritional Status: BMI > 30  Obese Nutritional Risks: None Diabetes: No  How often do you need to have someone help you when you read instructions, pamphlets, or other written materials from your doctor or pharmacy?: 1 - Never What is the last grade level you completed in school?: 11th grade  Diabetic? no  Interpreter Needed?: No  Information entered by :: NAllen LPN   Activities of Daily Living In your present state of health, do you have any difficulty performing the following activities: 07/07/2020 02/02/2020  Hearing? N N  Vision? N Y  Difficulty concentrating or making decisions? N N  Walking or climbing stairs? N N  Dressing or bathing? N N  Doing errands, shopping? N N  Preparing Food and eating ? N -  Using the Toilet? N -  In the past six months, have you accidently leaked urine? N -  Do you have problems with loss of  bowel control? N -  Managing your Medications? N -  Managing your Finances? N -  Housekeeping or managing your Housekeeping? N -  Some recent data might be hidden    Patient Care Team: Venita Lick, NP as PCP - General (Nurse Practitioner) Anabel Bene, MD as Referring Physician (Neurology) Renato Shin, MD as Consulting Physician (Endocrinology) Bary Castilla Forest Gleason, MD as Consulting Physician (General Surgery) Vanita Ingles, RN as Case Manager (General Practice)  Indicate any recent Medical Services you may have received from other than [facility] providers in the past year (date may be approximate).     Assessment:   This is a routine wellness examination for Rachel Cox.  Hearing/Vision screen No exam data present  Dietary issues and exercise activities discussed: Current Exercise Habits: The patient does not participate in regular exercise at present  Goals    .  DIET - INCREASE WATER INTAKE      Recommend to drink at least 6-8 8oz glasses of water per day.    .  Patient Stated      07/07/2020, wants to weigh 150 pounds    .  RNCM: Pt-"I take my blood pressure every day" (pt-stated)      CARE PLAN ENTRY (see longtitudinal plan of care for additional care plan information)  Current Barriers:  . Chronic Disease Management support, education, and care coordination needs related to HTN, HLD, and CKD Stage 3  Clinical Goal(s) related to HTN, HLD, and CKD Stage 3 :  Over the next 120 days, patient will:  . Work with the care management team to address educational, disease management, and care coordination needs  . Begin or continue self health monitoring activities as directed today Measure and record blood pressure 3 times per week and adhere to a heart healthy diet  . Call provider office for new or worsened signs and symptoms Blood pressure findings outside established parameters, Shortness of breath, and New or worsened symptom related to HLD/CKD and other chronic  conditions.  . Call care management team with questions or concerns . Verbalize basic understanding of patient centered plan of care established today  Interventions related to HTN, HLD, and CKD Stage 3 :  . Evaluation of current treatment plans and patient's adherence to plan as established by provider.  The patient verbalized understanding of the plan of  care. The patient saw the MD for her thyroid follow up recently. Her thyroid condition is stable. 03-01-2020: The patient states she is feeling great. She goes on 03-13-2020 to have her right cataract removed.  She sees her specialist for her thyroid management and lab work indicated that Keppra level was low but she is confident in her health care team and is managing well.  . Assessed patient understanding of disease states.  The patient has a good understanding of her chronic conditions. The patient takes her blood pressures at home. Average is 130/80. The patients was elevated at the specialist office.  The patient says she doesn't know why but she does have the "white coat syndrome" when going to the doctor. Will continue to monitor. 03-01-2020: Her blood pressures remain stable. The patient takes at home. No issues noted.  . Assessed patient's education and care coordination needs.  Patient denies any new concerns at this time. Will continue to monitor.  . Provided disease specific education to patient. Evaluation of heart healthy diet. The patient states she does not always do a good job at this but she is trying to do better. Discussed fresh fruits and vegetables. The patient is enjoying seasonal foods. Education and support. 03-01-2020: The patient states that she is taking an iron supplement now. Denies any issues with the supplement. The patient also states that her husband was on the phone with the health department attempting to get an appointment for the COVID booster vaccine. The patient denies any issues at this time. Will continue to monitor.   Steele Sizer with appropriate clinical care team members regarding patient needs.  Denies any needs from the LCSW or pharmacist at this time. Will continue to monitor.   Patient Self Care Activities related to HTN, HLD, and CKD Stage 3 :  . Patient is unable to independently self-manage chronic health conditions  Please see past updates related to this goal by clicking on the "Past Updates" button in the selected goal        Depression Screen PHQ 2/9 Scores 07/07/2020 02/02/2020 10/13/2019 08/02/2019 02/01/2019 01/04/2019 07/13/2018  PHQ - 2 Score 0 0 0 2 0 0 0  PHQ- 9 Score 0 0 - 4 - 0 0    Fall Risk Fall Risk  07/07/2020 02/01/2019 01/04/2019 06/01/2018 11/14/2017  Falls in the past year? 0 0 0 0 No  Number falls in past yr: - - 0 - -  Injury with Fall? - - 0 - -  Risk for fall due to : Medication side effect - - - -  Follow up Falls evaluation completed;Education provided;Falls prevention discussed - Falls evaluation completed - -    FALL RISK PREVENTION PERTAINING TO THE HOME:  Any stairs in or around the home? Yes  If so, are there any without handrails? No  Home free of loose throw rugs in walkways, pet beds, electrical cords, etc? Yes  Adequate lighting in your home to reduce risk of falls? Yes   ASSISTIVE DEVICES UTILIZED TO PREVENT FALLS:  Life alert? Yes  Use of a cane, [place] or w/c? No  Grab bars in the bathroom? Yes  Shower chair or bench in shower? No  Elevated toilet seat or a handicapped toilet? Yes   TIMED UP AND GO:  Was the test performed? No .   Cognitive Function:     6CIT Screen 07/07/2020 02/01/2019 11/14/2017 11/20/2016  What Year? 0 points 0 points 0 points 0 points  What month? 0 points  0 points 0 points 0 points  What time? 0 points 0 points 0 points 0 points  Count back from 20 0 points 0 points 0 points 0 points  Months in reverse 2 points 0 points 0 points 2 points  Repeat phrase 4 points 0 points 2 points 2 points  Total Score 6 0 2 4     Immunizations Immunization History  Administered Date(s) Administered  . Fluad Quad(high Dose 65+) 02/01/2019  . Influenza, High Dose Seasonal PF 03/14/2017, 03/10/2018  . Influenza,inj,Quad PF,6+ Mos 03/02/2015  . Influenza-Unspecified 04/16/2016, 02/15/2020  . PFIZER(Purple Top)SARS-COV-2 Vaccination 07/14/2019, 08/04/2019  . Pneumococcal Conjugate-13 11/15/2013  . Pneumococcal Polysaccharide-23 07/04/2015  . Td 07/04/1998  . Zoster 06/03/2010    TDAP status: Due, Education has been provided regarding the importance of this vaccine. Advised may receive this vaccine at local pharmacy or Health Dept. Aware to provide a copy of the vaccination record if obtained from local pharmacy or Health Dept. Verbalized acceptance and understanding.  Flu Vaccine status: Up to date  Pneumococcal vaccine status: Up to date  Covid-19 vaccine status: Completed vaccines  Qualifies for Shingles Vaccine? Yes   Zostavax completed Yes   Shingrix Completed?: No.    Education has been provided regarding the importance of this vaccine. Patient has been advised to call insurance company to determine out of pocket expense if they have not yet received this vaccine. Advised may also receive vaccine at local pharmacy or Health Dept. Verbalized acceptance and understanding.  Screening Tests Health Maintenance  Topic Date Due  . TETANUS/TDAP  07/04/2008  . DEXA SCAN  01/01/2018  . MAMMOGRAM  07/22/2018  . COVID-19 Vaccine (3 - Booster for Pfizer series) 02/04/2020  . COLONOSCOPY (Pts 45-33yrs Insurance coverage will need to be confirmed)  09/11/2022  . INFLUENZA VACCINE  Completed  . PNA vac Low Risk Adult  Completed  . Hepatitis C Screening  Addressed    Health Maintenance  Health Maintenance Due  Topic Date Due  . TETANUS/TDAP  07/04/2008  . DEXA SCAN  01/01/2018  . MAMMOGRAM  07/22/2018  . COVID-19 Vaccine (3 - Booster for Pfizer series) 02/04/2020    Colorectal cancer screening: Type of  screening: Colonoscopy. Completed 09/10/2017. Repeat every 5 years  Mammogram status: Completed 06/09/2020. Repeat every year  Bone Density status: Completed 2019 per patient.   Lung Cancer Screening: (Low Dose CT Chest recommended if Age 4-80 years, 30 pack-year currently smoking OR have quit w/in 15years.) does not qualify.   Lung Cancer Screening Referral: no  Additional Screening:  Hepatitis C Screening: does qualify; Completed 11/15/2013   Vision Screening: Recommended annual ophthalmology exams for early detection of glaucoma and other disorders of the eye. Is the patient up to date with their annual eye exam?  No  Who is the provider or what is the name of the office in which the patient attends annual eye exams? Dr. Gloriann Loan If pt is not established with a provider, would they like to be referred to a provider to establish care? No .   Dental Screening: Recommended annual dental exams for proper oral hygiene  Community Resource Referral / Chronic Care Management: CRR required this visit?  No   CCM required this visit?  No      Plan:     I have personally reviewed and noted the following in the patient's chart:   . Medical and social history . Use of alcohol, tobacco or illicit drugs  . Current medications and supplements .  Functional ability and status . Nutritional status . Physical activity . Advanced directives . List of other physicians . Hospitalizations, surgeries, and ER visits in previous 12 months . Vitals . Screenings to include cognitive, depression, and falls . Referrals and appointments  In addition, I have reviewed and discussed with patient certain preventive protocols, quality metrics, and best practice recommendations. A written personalized care plan for preventive services as well as general preventive health recommendations were provided to patient.     Kellie Simmering, LPN   [id number]   Nurse Notes:

## 2020-07-12 ENCOUNTER — Telehealth: Payer: PPO

## 2020-07-26 DIAGNOSIS — H40153 Residual stage of open-angle glaucoma, bilateral: Secondary | ICD-10-CM | POA: Diagnosis not present

## 2020-07-29 ENCOUNTER — Encounter: Payer: Self-pay | Admitting: Nurse Practitioner

## 2020-08-01 ENCOUNTER — Ambulatory Visit (INDEPENDENT_AMBULATORY_CARE_PROVIDER_SITE_OTHER): Payer: PPO | Admitting: Nurse Practitioner

## 2020-08-01 ENCOUNTER — Other Ambulatory Visit: Payer: Self-pay

## 2020-08-01 ENCOUNTER — Encounter: Payer: Self-pay | Admitting: Nurse Practitioner

## 2020-08-01 VITALS — BP 116/78 | HR 86 | Temp 97.8°F | Ht 64.29 in | Wt 178.4 lb

## 2020-08-01 DIAGNOSIS — H401131 Primary open-angle glaucoma, bilateral, mild stage: Secondary | ICD-10-CM

## 2020-08-01 DIAGNOSIS — F3341 Major depressive disorder, recurrent, in partial remission: Secondary | ICD-10-CM | POA: Diagnosis not present

## 2020-08-01 DIAGNOSIS — N1831 Chronic kidney disease, stage 3a: Secondary | ICD-10-CM

## 2020-08-01 DIAGNOSIS — E039 Hypothyroidism, unspecified: Secondary | ICD-10-CM | POA: Diagnosis not present

## 2020-08-01 DIAGNOSIS — Z6830 Body mass index (BMI) 30.0-30.9, adult: Secondary | ICD-10-CM

## 2020-08-01 DIAGNOSIS — G40802 Other epilepsy, not intractable, without status epilepticus: Secondary | ICD-10-CM

## 2020-08-01 DIAGNOSIS — F419 Anxiety disorder, unspecified: Secondary | ICD-10-CM

## 2020-08-01 DIAGNOSIS — E041 Nontoxic single thyroid nodule: Secondary | ICD-10-CM

## 2020-08-01 DIAGNOSIS — E559 Vitamin D deficiency, unspecified: Secondary | ICD-10-CM

## 2020-08-01 DIAGNOSIS — E66811 Obesity, class 1: Secondary | ICD-10-CM

## 2020-08-01 DIAGNOSIS — I10 Essential (primary) hypertension: Secondary | ICD-10-CM | POA: Diagnosis not present

## 2020-08-01 DIAGNOSIS — J3089 Other allergic rhinitis: Secondary | ICD-10-CM

## 2020-08-01 DIAGNOSIS — E782 Mixed hyperlipidemia: Secondary | ICD-10-CM | POA: Diagnosis not present

## 2020-08-01 DIAGNOSIS — E538 Deficiency of other specified B group vitamins: Secondary | ICD-10-CM

## 2020-08-01 DIAGNOSIS — E6609 Other obesity due to excess calories: Secondary | ICD-10-CM

## 2020-08-01 DIAGNOSIS — M8580 Other specified disorders of bone density and structure, unspecified site: Secondary | ICD-10-CM

## 2020-08-01 DIAGNOSIS — D508 Other iron deficiency anemias: Secondary | ICD-10-CM

## 2020-08-01 DIAGNOSIS — F19982 Other psychoactive substance use, unspecified with psychoactive substance-induced sleep disorder: Secondary | ICD-10-CM | POA: Diagnosis not present

## 2020-08-01 NOTE — Assessment & Plan Note (Signed)
Chronic, stable.  Avoid NSAIDs.  Continue Losartan for kidney protection.  CMP today.

## 2020-08-01 NOTE — Assessment & Plan Note (Signed)
Chronic, ongoing.  Continue current medication regimen as recommended by Dr. Ellis and collaboration with endocrinology.   ?

## 2020-08-01 NOTE — Assessment & Plan Note (Addendum)
Chronic, ongoing.  Continue current medication regimen and adjust as needed.  Lipid panel today. Return in 6 months for physical.  Refills sent in.

## 2020-08-01 NOTE — Assessment & Plan Note (Signed)
Chronic, ongoing.  Continue current medication regimen, Remeron, and adjust as needed.   

## 2020-08-01 NOTE — Assessment & Plan Note (Signed)
Chronic, ongoing.  Continue daily supplement and adjust as needed.  Last DEXA 2020.  Check Vit D level next visit.

## 2020-08-01 NOTE — Assessment & Plan Note (Signed)
BMI 30.35.  Recommended eating smaller high protein, low fat meals more frequently and exercising 30 mins a day 5 times a week with a goal of 10-15lb weight loss in the next 3 months. Patient voiced their understanding and motivation to adhere to these recommendations.

## 2020-08-01 NOTE — Assessment & Plan Note (Signed)
Chronic, stable.  Check CBC, ferritin, iron, and B12 next visit.  Continue daily supplement and adjust plan of care as needed based on labs.

## 2020-08-01 NOTE — Assessment & Plan Note (Signed)
Chronic, ongoing. Can not use Flonase due to new diagnosis of Open-angle glaucoma. Recommend transition to Claritin OTC 10 MG daily and monitor.  For worsening or ongoing symptoms return to office.

## 2020-08-01 NOTE — Progress Notes (Signed)
BP 116/78   Pulse 86   Temp 97.8 F (36.6 C) (Oral)   Ht 5' 4.29" (1.633 m)   Wt 178 lb 6.4 oz (80.9 kg)   SpO2 96%   BMI 30.35 kg/m    Subjective:    Patient ID: Rachel Cox, female    DOB: Sep 21, 1950, 70 y.o.   MRN: [id number]  HPI: Rachel Cox is a 70 y.o. female  Chief Complaint  Patient presents with  . Hypertension  . Hyperlipidemia  . Thyroid Problem  . mood  . Seizures   SEIZURE DISORDER Followed by Dr. Melrose Nakayama, recently saw on Feb 15th, 2021 -- returns to see March 16th. Has had no seizure since 2016. Keppra 250 MG at bedtime -- level in September 2021 was 6.2.    HYPERTENSION / HYPERLIPIDEMIA Continues on Losartan, HCTZ and Atorvastatin.Had eye exam about 6 months ago and diagnosed with open-angle glaucoma, bilateral.   Satisfied with current treatment?yes Duration of hypertension:chronic BP monitoring frequency:once a week BP range:130/70-80 BP medication side effects:yes Duration of hyperlipidemia:chronic Cholesterol medication side effects:no Cholesterol supplements: none Medication compliance:good compliance Aspirin:no Recent stressors:no Recurrent headaches:no Visual changes:no Palpitations:no Dyspnea:no Chest pain:no Lower extremity edema:no Dizzy/lightheaded:no  ANEMIA In September ferritin level11, iron71, H/H 14.6/47.1, MCV 90, B12 1125.Takes B12 and multivitamin daily at this time. Anemia status:stable Etiology of anemia: Duration of anemia treatment:  Compliance with treatment:good compliance Iron supplementation side effects:no Severity of anemia:mild Fatigue:no Decreased exercise tolerance:no Dyspnea on exertion:no Palpitations:no Bleeding:no Pica:no  CHRONIC KIDNEY DISEASE Last GFR 62 and CRT 0.93 -- September 2021.  Has underlying osteopenia noted on DEXA August 2020, continues on daily supplements -- last Vitamin D 34.7. CKD status:stable Medications renally dose:yes Previous  renal evaluation:no Pneumovax:Up to Date Influenza Vaccine:Up to Date  HYPOTHYROIDISM Last TSH 3.00 and T4 1.23 -- followed by Dr. Lissa Merlin and last seen 10/19/19 -- returns in June this year. Thyroid control status:stable Satisfied with current treatment?yes Medication side effects:no Medication compliance:good compliance Etiology of hypothyroidism:  Recent dose adjustment:no Fatigue:no Cold intolerance:no Heat intolerance:no Weight gain:no Weight loss:no Constipation:no Diarrhea/loose stools:no Palpitations:no Lower extremity edema:no Anxiety/depressed mood:no  ANXIETY/STRESS Continues on Remeron 30 MG for mood and insomnia, reports this works well. Duration:stable Anxious mood:no Excessive worrying:no Irritability:no Sweating:no Nausea:no Palpitations:no Hyperventilation:no Panic attacks:no Agoraphobia:no Obscessions/compulsions:no Depressed mood:no Depression screen [facility] 2/9 08/01/2020 07/07/2020 02/02/2020 10/13/2019 08/02/2019  Decreased Interest 0 0 0 0 1  Down, Depressed, Hopeless 0 0 0 0 1  PHQ - 2 Score 0 0 0 0 2  Altered sleeping 0 0 0 - 1  Tired, decreased energy 0 0 0 - 1  Change in appetite 1 0 0 - 0  Feeling bad or failure about yourself  0 0 0 - 0  Trouble concentrating 0 0 0 - 0  Moving slowly or fidgety/restless 0 0 0 - 0  Suicidal thoughts 0 0 0 - 0  PHQ-9 Score 1 0 0 - 4  Difficult doing work/chores - Not difficult at all Not difficult at all - Not difficult at all  Some recent data might be hidden    Relevant past medical, surgical, family and social history reviewed and updated as indicated. Interim medical history since our last visit reviewed. Allergies and medications reviewed and updated.  Review of Systems  Constitutional: Negative for activity change, appetite change, diaphoresis, fatigue and fever.  Respiratory: Negative for cough, chest tightness, shortness of breath and wheezing.   Cardiovascular: Negative  for chest pain, palpitations and leg  swelling.  Gastrointestinal: Negative.   Endocrine: Negative for cold intolerance and heat intolerance.  Neurological: Negative.   Psychiatric/Behavioral: Negative.     Per HPI unless specifically indicated above     Objective:    BP 116/78   Pulse 86   Temp 97.8 F (36.6 C) (Oral)   Ht 5' 4.29" (1.633 m)   Wt 178 lb 6.4 oz (80.9 kg)   SpO2 96%   BMI 30.35 kg/m   Wt Readings from Last 3 Encounters:  08/01/20 178 lb 6.4 oz (80.9 kg)  07/07/20 180 lb (81.6 kg)  02/02/20 181 lb (82.1 kg)    Physical Exam Vitals and nursing note reviewed.  Constitutional:      General: She is awake. She is not in acute distress.    Appearance: She is well-developed and overweight. She is not ill-appearing.  HENT:     Head: Normocephalic.     Right Ear: Hearing normal.     Left Ear: Hearing normal.  Eyes:     General: Lids are normal.        Right eye: No discharge.        Left eye: No discharge.     Conjunctiva/sclera: Conjunctivae normal.     Pupils: Pupils are equal, round, and reactive to light.  Neck:     Thyroid: No thyromegaly.     Vascular: No carotid bruit.  Cardiovascular:     Rate and Rhythm: Normal rate and regular rhythm.     Heart sounds: Normal heart sounds. No murmur heard. No gallop.   Pulmonary:     Effort: Pulmonary effort is normal. No accessory muscle usage or respiratory distress.     Breath sounds: Normal breath sounds.  Abdominal:     General: Bowel sounds are normal.     Palpations: Abdomen is soft. There is no hepatomegaly or splenomegaly.  Musculoskeletal:     Cervical back: Normal range of motion and neck supple.     Right lower leg: No edema.     Left lower leg: No edema.  Skin:    General: Skin is warm and dry.  Neurological:     Mental Status: She is alert and oriented to person, place, and time.  Psychiatric:        Attention and Perception: Attention normal.        Mood and Affect: Mood normal.         Speech: Speech normal.        Behavior: Behavior normal. Behavior is cooperative.     Results for orders placed or performed in visit on 02/02/20  CBC with Differential/Platelet  Result Value Ref Range   WBC 4.6 3.4 - 10.8 x10E3/uL   RBC 5.23 3.77 - 5.28 x10E6/uL   Hemoglobin 14.6 11.1 - 15.9 g/dL   Hematocrit 47.1 (H) 34.0 - 46.6 %   MCV 90 79 - 97 fL   MCH 27.9 26.6 - 33.0 pg   MCHC 31.0 (L) 31.5 - 35.7 g/dL   RDW 14.0 11.7 - 15.4 %   Platelets 307 150 - 450 x10E3/uL   Neutrophils 52 Not Estab. %   Lymphs 30 Not Estab. %   Monocytes 15 Not Estab. %   Eos 2 Not Estab. %   Basos 1 Not Estab. %   Neutrophils Absolute 2.4 1.4 - 7.0 x10E3/uL   Lymphocytes Absolute 1.4 0.7 - 3.1 x10E3/uL   Monocytes Absolute 0.7 0.1 - 0.9 x10E3/uL   EOS (ABSOLUTE) 0.1 0.0 - 0.4 x10E3/uL  Basophils Absolute 0.0 0.0 - 0.2 x10E3/uL   Immature Granulocytes 0 Not Estab. %   Immature Grans (Abs) 0.0 0.0 - 0.1 x10E3/uL  Comprehensive metabolic panel  Result Value Ref Range   Glucose 95 65 - 99 mg/dL   BUN 15 8 - 27 mg/dL   Creatinine, Ser 0.93 0.57 - 1.00 mg/dL   GFR calc non Af Amer 62 >59 mL/min/1.73   GFR calc Af Amer 72 >59 mL/min/1.73   BUN/Creatinine Ratio 16 12 - 28   Sodium 141 134 - 144 mmol/L   Potassium 3.7 3.5 - 5.2 mmol/L   Chloride 102 96 - 106 mmol/L   CO2 26 20 - 29 mmol/L   Calcium 10.0 8.7 - 10.3 mg/dL   Total Protein 7.4 6.0 - 8.5 g/dL   Albumin 4.2 3.7 - 4.7 g/dL   Globulin, Total 3.2 1.5 - 4.5 g/dL   Albumin/Globulin Ratio 1.3 1.2 - 2.2   Bilirubin Total 0.7 0.0 - 1.2 mg/dL   Alkaline Phosphatase 98 48 - 121 IU/L   AST 22 0 - 40 IU/L   ALT 16 0 - 32 IU/L  Lipid Panel w/o Chol/HDL Ratio  Result Value Ref Range   Cholesterol, Total 189 100 - 199 mg/dL   Triglycerides 118 0 - 149 mg/dL   HDL 75 >39 mg/dL   VLDL Cholesterol Cal 21 5 - 40 mg/dL   LDL Chol Calc (NIH) 93 0 - 99 mg/dL  VITAMIN D 25 Hydroxy (Vit-D Deficiency, Fractures)  Result Value Ref Range   Vit D,  25-Hydroxy 34.7 30.0 - 100.0 ng/mL  Vitamin B12  Result Value Ref Range   Vitamin B-12 1,125 232 - 1,245 pg/mL  Microalbumin, Urine Waived  Result Value Ref Range   Microalb, Ur Waived 80 (H) 0 - 19 mg/L   Creatinine, Urine Waived 200 10 - 300 mg/dL   Microalb/Creat Ratio 30-300 (H) <30 mg/g  Iron, TIBC and Ferritin Panel  Result Value Ref Range   Total Iron Binding Capacity 360 250 - 450 ug/dL   UIBC 289 118 - 369 ug/dL   Iron 71 27 - 139 ug/dL   Iron Saturation 20 15 - 55 %   Ferritin 11 (L) 15 - 150 ng/mL  Levetiracetam level  Result Value Ref Range   Levetiracetam Lvl 6.2 (L) 10.0 - 40.0 ug/mL  Magnesium  Result Value Ref Range   Magnesium 1.9 1.6 - 2.3 mg/dL      Assessment & Plan:   Problem List Items Addressed This Visit      Cardiovascular and Mediastinum   Essential hypertension    Chronic, ongoing with BP at goal for age today and on occasional home readings at goal.  Recommend continuing to check BP at home a few days a week + focus on DASH diet.  Continue current medication regimen and adjust as needed.  Obtain CMP today.  Return in 6 months for physical.  Refills sent as needed.      Relevant Orders   Comprehensive metabolic panel     Respiratory   Allergic rhinitis    Chronic, ongoing. Can not use Flonase due to new diagnosis of Open-angle glaucoma. Recommend transition to Claritin OTC 10 MG daily and monitor.  For worsening or ongoing symptoms return to office.        Endocrine   Hypothyroidism    Chronic, ongoing.  Continue current medication regimen as recommended by Dr. Lissa Merlin and collaboration with endocrinology.  Thyroid nodule    Chronic, ongoing.  Continue current medication regimen as recommended by Dr. Lissa Merlin and collaboration with endocrinology.          Nervous and Auditory   Epilepsy (HCC)    Chronic, stable.  Continue current medication regimen as recommended by neuro and collaboration with them.  CMP today.         Musculoskeletal and Integument   Osteopenia (Chronic)    Ongoing with DEXA last in 2020 -- repeat in May 2025.  Continue Vit D daily and recommend fall precautions.  Check Vit D level next visit.        Genitourinary   CKD (chronic kidney disease) stage 3, GFR 30-59 ml/min (HCC) (Chronic)    Chronic, stable.  Avoid NSAIDs.  Continue Losartan for kidney protection.  CMP today.      Relevant Orders   Comprehensive metabolic panel     Other   Hyperlipemia    Chronic, ongoing.  Continue current medication regimen and adjust as needed.  Lipid panel today. Return in 6 months for physical.  Refills sent in.      Relevant Orders   Lipid Panel w/o Chol/HDL Ratio   Insomnia    Chronic, ongoing.  Continue current medication regimen, Remeron, and adjust as needed.        Depression - Primary    Chronic, ongoing.  Continue current medication regimen, Remeron, and adjust as needed.  Denies SI/HI.  Refills sent up to date.  Return in 6 months for physical.      Anxiety    Chronic, stable.  Continue current medication regimen, Remeron, and adjust as needed.  May consider Buspar for anxiety.  Avoid benzo due to age.        Vitamin D deficiency    Chronic, ongoing.  Continue daily supplement and adjust as needed.  Last DEXA 2020.  Check Vit D level next visit.      Iron deficiency anemia    Chronic, stable.  Check CBC, ferritin, iron, and B12 next visit.  Continue daily supplement and adjust plan of care as needed based on labs.      Obesity    BMI 30.35.  Recommended eating smaller high protein, low fat meals more frequently and exercising 30 mins a day 5 times a week with a goal of 10-15lb weight loss in the next 3 months. Patient voiced their understanding and motivation to adhere to these recommendations.       B12 deficiency    Chronic, ongoing.  Continue daily supplement and adjust as needed.  Check B12 level next visit.      Primary open-angle glaucoma, bilateral, mild stage     Followed by Dr. Gloriann Loan, continue this collaboration and recommend avoiding Flonase products.      Relevant Medications   dorzolamide-timolol (COSOPT) 22.3-6.8 MG/ML ophthalmic solution       Follow up plan: Return in about 6 months (around 02/01/2021) for Annual physical after 02/01/21.

## 2020-08-01 NOTE — Assessment & Plan Note (Signed)
Followed by Dr. Bell, continue this collaboration and recommend avoiding Flonase products. 

## 2020-08-01 NOTE — Assessment & Plan Note (Addendum)
Chronic, ongoing.  Continue current medication regimen, Remeron, and adjust as needed.  Denies SI/HI.  Refills sent up to date.  Return in 6 months for physical.

## 2020-08-01 NOTE — Assessment & Plan Note (Signed)
Chronic, stable.  Continue current medication regimen, Remeron, and adjust as needed.  May consider Buspar for anxiety.  Avoid benzo due to age.

## 2020-08-01 NOTE — Assessment & Plan Note (Signed)
Ongoing with DEXA last in 2020 -- repeat in May 2025.  Continue Vit D daily and recommend fall precautions.  Check Vit D level next visit.

## 2020-08-01 NOTE — Patient Instructions (Signed)
Glaucoma  Glaucoma happens when the fluid pressure in the eye is too high. If the pressure stays high for too long, the eye may get damaged. This can cause a loss of vision. There are two types of glaucoma. They are:  Open-angle glaucoma. This is the most common type. It causes pressure in the eye to go up slowly. There may be no symptoms at first. Testing for this condition can help to find the condition before damage happens. Early treatment can often stop vision loss.  Acute angle-closure glaucoma. With this type, the pressure inside the eye rises suddenly to a very high level. This causes very bad pain and needs to be treated right away. What are the causes? In many cases, the cause is not known. Glaucoma can sometimes result from other diseases, such as infection, cataracts, or tumors. What increases the risk?  Being older than age 10.  Having high blood pressure or diabetes.  Having a family history of glaucoma.  Having had an eye injury or eye surgery in the past.  Being farsighted.  Taking certain medicines. What are the signs or symptoms? Open-angle glaucoma often causes no symptoms early on. If it is not treated, the condition:  Will get worse and may cause a loss of side vision (peripheral vision).  Can advance to tunnel vision, which means that you are able to see straight ahead but you have a loss of side vision in all directions.  May lead to a total loss of vision. Symptoms of acute angle-closure glaucoma develop suddenly and may include:  Cloudy vision.  Very bad pain in your affected eye.  A very bad headache in the area around your eye.  Feeling like you may vomit.  Vomiting. How is this treated?  Eye drops.  Laser treatment.  Surgery. Follow these instructions at home: Medicines  Take over-the-counter and prescription medicines only as told by your doctor.  If you were given eye drops, use them exactly as told. You will likely need to use this  medicine for the rest of your life. General instructions  Exercise often. Talk with your doctor about which types of exercise are safe for you. Do not do exercises where you lean your head on the floor while you lift part of your body off the floor, such as a headstand.  Keep all follow-up visits. Contact a doctor if:  Your symptoms get worse.  You have new symptoms. Get help right away if:  You have very bad pain in your eye.  You have vision problems.  You have a bad headache in the area around your eye.  You feel like you may vomit (nauseous).  You vomit.  You start to have the same problems with your other eye. Summary  Glaucoma happens when the fluid pressure in the eye is too high. If this is not treated, it can cause a loss of vision.  There may be no symptoms at first. Testing can help to find the condition before damage happens.  Early treatment can often stop vision loss. This information is not intended to replace advice given to you by your health care provider. Make sure you discuss any questions you have with your health care provider. Document Revised: 10/21/2019 Document Reviewed: 10/21/2019 Elsevier Patient Education  [place].

## 2020-08-01 NOTE — Assessment & Plan Note (Signed)
Chronic, ongoing.  Continue daily supplement and adjust as needed.  Check B12 level next visit.

## 2020-08-01 NOTE — Assessment & Plan Note (Addendum)
Chronic, stable.  Continue current medication regimen as recommended by neuro and collaboration with them.  CMP today.

## 2020-08-01 NOTE — Assessment & Plan Note (Signed)
Chronic, ongoing with BP at goal for age today and on occasional home readings at goal.  Recommend continuing to check BP at home a few days a week + focus on DASH diet.  Continue current medication regimen and adjust as needed.  Obtain CMP today.  Return in 6 months for physical.  Refills sent as needed.

## 2020-08-02 LAB — COMPREHENSIVE METABOLIC PANEL
ALT: 19 IU/L (ref 0–32)
AST: 25 IU/L (ref 0–40)
Albumin/Globulin Ratio: 1.7 (ref 1.2–2.2)
Albumin: 4.4 g/dL (ref 3.7–4.7)
Alkaline Phosphatase: 83 IU/L (ref 44–121)
BUN/Creatinine Ratio: 11 — ABNORMAL LOW (ref 12–28)
BUN: 12 mg/dL (ref 8–27)
Bilirubin Total: 0.7 mg/dL (ref 0.0–1.2)
CO2: 25 mmol/L (ref 20–29)
Calcium: 10.2 mg/dL (ref 8.7–10.3)
Chloride: 103 mmol/L (ref 96–106)
Creatinine, Ser: 1.07 mg/dL — ABNORMAL HIGH (ref 0.57–1.00)
Globulin, Total: 2.6 g/dL (ref 1.5–4.5)
Glucose: 95 mg/dL (ref 65–99)
Potassium: 3.7 mmol/L (ref 3.5–5.2)
Sodium: 144 mmol/L (ref 134–144)
Total Protein: 7 g/dL (ref 6.0–8.5)
eGFR: 56 mL/min/{1.73_m2} — ABNORMAL LOW (ref >59)

## 2020-08-02 LAB — LIPID PANEL W/O CHOL/HDL RATIO
Cholesterol, Total: 166 mg/dL (ref 100–199)
HDL: 63 mg/dL (ref >39)
LDL Chol Calc (NIH): 83 mg/dL (ref 0–99)
Triglycerides: 111 mg/dL (ref 0–149)
VLDL Cholesterol Cal: 20 mg/dL (ref 5–40)

## 2020-08-02 NOTE — Progress Notes (Signed)
Contacted via MyChart   Good evening Rachel Cox, your labs have returned.  Kidneys are showing some mild kidney disease this check with creatinine 1.07 and GFR 56.  This is considered chronic kidney disease stage 3a and you could live with this for many years.  We will continue to monitor closely and ensure no worsening, we may also adjust your blood pressure medications next visit to help keep kidneys safe.  I recommend increasing water intake daily.  Cholesterol levels remain stable.  At this time continue all current medications.  Any questions? Keep being awesome!!  Thank you for allowing me to participate in your care. Kindest regards, Charonda Hefter

## 2020-08-16 DIAGNOSIS — F419 Anxiety disorder, unspecified: Secondary | ICD-10-CM | POA: Diagnosis not present

## 2020-08-16 DIAGNOSIS — F439 Reaction to severe stress, unspecified: Secondary | ICD-10-CM | POA: Diagnosis not present

## 2020-08-16 DIAGNOSIS — I10 Essential (primary) hypertension: Secondary | ICD-10-CM | POA: Diagnosis not present

## 2020-08-16 DIAGNOSIS — R569 Unspecified convulsions: Secondary | ICD-10-CM | POA: Diagnosis not present

## 2020-08-22 ENCOUNTER — Telehealth: Payer: PPO

## 2020-09-27 ENCOUNTER — Ambulatory Visit (INDEPENDENT_AMBULATORY_CARE_PROVIDER_SITE_OTHER): Payer: PPO | Admitting: General Practice

## 2020-09-27 ENCOUNTER — Telehealth: Payer: PPO | Admitting: General Practice

## 2020-09-27 DIAGNOSIS — E782 Mixed hyperlipidemia: Secondary | ICD-10-CM | POA: Diagnosis not present

## 2020-09-27 DIAGNOSIS — I10 Essential (primary) hypertension: Secondary | ICD-10-CM | POA: Diagnosis not present

## 2020-09-27 NOTE — Patient Instructions (Signed)
Visit Information  PATIENT GOALS: Goals Addressed              This Visit's Progress   .  COMPLETED: RNCM: Pt-"I take my blood pressure every day" (pt-stated)        CARE PLAN ENTRY (see longtitudinal plan of care for additional care plan information)  Current Barriers: Closing this goal and opening in new ELS . Chronic Disease Management support, education, and care coordination needs related to HTN, HLD, and CKD Stage 3  Clinical Goal(s) related to HTN, HLD, and CKD Stage 3 :  Over the next 120 days, patient will:  . Work with the care management team to address educational, disease management, and care coordination needs  . Begin or continue self health monitoring activities as directed today Measure and record blood pressure 3 times per week and adhere to a heart healthy diet  . Call provider office for new or worsened signs and symptoms Blood pressure findings outside established parameters, Shortness of breath, and New or worsened symptom related to HLD/CKD and other chronic conditions.  . Call care management team with questions or concerns . Verbalize basic understanding of patient centered plan of care established today  Interventions related to HTN, HLD, and CKD Stage 3 :  . Evaluation of current treatment plans and patient's adherence to plan as established by provider.  The patient verbalized understanding of the plan of care. The patient saw the MD for her thyroid follow up recently. Her thyroid condition is stable. 03-01-2020: The patient states she is feeling great. She goes on 03-13-2020 to have her right cataract removed.  She sees her specialist for her thyroid management and lab work indicated that [place] level was low but she is confident in her health care team and is managing well.  . Assessed patient understanding of disease states.  The patient has a good understanding of her chronic conditions. The patient takes her blood pressures at home. Average is 130/80. The  patients was elevated at the specialist office.  The patient says she doesn't know why but she does have the "white coat syndrome" when going to the doctor. Will continue to monitor. 03-01-2020: Her blood pressures remain stable. The patient takes at home. No issues noted.  . Assessed patient's education and care coordination needs.  Patient denies any new concerns at this time. Will continue to monitor.  . Provided disease specific education to patient. Evaluation of heart healthy diet. The patient states she does not always do a good job at this but she is trying to do better. Discussed fresh fruits and vegetables. The patient is enjoying seasonal foods. Education and support. 03-01-2020: The patient states that she is taking an iron supplement now. Denies any issues with the supplement. The patient also states that her husband was on the phone with the health department attempting to get an appointment for the COVID booster vaccine. The patient denies any issues at this time. Will continue to monitor.  Nash Dimmer with appropriate clinical care team members regarding patient needs.  Denies any needs from the LCSW or pharmacist at this time. Will continue to monitor.   Patient Self Care Activities related to HTN, HLD, and CKD Stage 3 :  . Patient is unable to independently self-manage chronic health conditions  Please see past updates related to this goal by clicking on the "Past Updates" button in the selected goal        Patient Care Plan: RNCM: Hypertension (Adult)  Problem Identified: RNCM: Hypertension (Hypertension)   Priority: Medium    Goal: Hypertension Monitored   Note:   Objective:  . Last practice recorded BP readings:  BP Readings from Last 3 Encounters:  08/01/20 116/78  02/02/20 127/85  10/19/19 (!) 154/86 .   Marland Kitchen Most recent eGFR/CrCl:  Lab Results  Component Value Date   EGFR 56 (L) 08/01/2020 .    No components found for: CRCL Current Barriers:  Marland Kitchen Knowledge  Deficits related to basic understanding of hypertension pathophysiology and self care management . Knowledge Deficits related to understanding of medications prescribed for management of hypertension . Does not contact provider office for questions/concerns Case Manager Clinical Goal(s):  . patient will verbalize understanding of plan for hypertension management . patient will attend all scheduled medical appointments: 02-06-2021 . patient will demonstrate improved adherence to prescribed treatment plan for hypertension as evidenced by taking all medications as prescribed, monitoring and recording blood pressure as directed, adhering to low sodium/DASH diet . patient will demonstrate improved health management independence as evidenced by checking blood pressure as directed and notifying PCP if SBP>160 or DBP > 90, taking all medications as prescribe, and adhering to a low sodium diet as discussed. . patient will verbalize basic understanding of hypertension disease process and self health management plan as evidenced by medication compliance, heart healthy diet compliance and working with the CCM team to manage health and well being.  Interventions:  . Collaboration with Venita Lick, NP regarding development and update of comprehensive plan of care as evidenced by provider attestation and co-signature . Inter-disciplinary care team collaboration (see longitudinal plan of care) . Evaluation of current treatment plan related to hypertension self management and patient's adherence to plan as established by provider. . Provided education to patient re: stroke prevention, s/s of heart attack and stroke, DASH diet, complications of uncontrolled blood pressure . Reviewed medications with patient and discussed importance of compliance . Discussed plans with patient for ongoing care management follow up and provided patient with direct contact information for care management team . Advised patient,  providing education and rationale, to monitor blood pressure daily and record, calling PCP for findings outside established parameters.  . Reviewed scheduled/upcoming provider appointments including: 02-06-2021 Self-Care Activities: - Self administers medications as prescribed Attends all scheduled provider appointments Calls provider office for new concerns, questions, or BP outside discussed parameters Checks BP and records as discussed Follows a low sodium diet/DASH diet Patient Goals: - check blood pressure weekly - choose a place to take my blood pressure (home, clinic or office, retail store) - write blood pressure results in a log or diary - agree on reward when goals are met - agree to work together to make changes - ask questions to understand - have a family meeting to talk about healthy habits - learn about high blood pressure - blood pressure trends reviewed - depression screen reviewed - home or ambulatory blood pressure monitoring encouraged  Follow Up Plan: Telephone follow up appointment with care management team member scheduled for: 12-06-2020 at 1 pm   Task: RNCM: Identify and Monitor Blood Pressure Elevation   Note:   Care Management Activities:    - blood pressure trends reviewed - depression screen reviewed - home or ambulatory blood pressure monitoring encouraged     Patient Care Plan: RNCM: HLD Management    Problem Identified: RNCM: HLD Management   Priority: Medium    Long-Range Goal: RNCM: Self-Management Plan Developed   Priority: Medium  Note:  Current Barriers:  . Poorly controlled hyperlipidemia, complicated by history of seizures, HTN . Current antihyperlipidemic regimen: Lipitor 40 mg QD . Most recent lipid panel:     Component Value Date/Time   CHOL 166 08/01/2020 0903   TRIG 111 08/01/2020 0903   HDL 63 08/01/2020 0903   CHOLHDL 2.6 07/13/2018 1015   VLDL 16 01/06/2017 0928   LDLCALC 83 08/01/2020 0903   LDLCALC 103 (H) 07/13/2018  1015 .   Marland Kitchen ASCVD risk enhancing conditions: age >67, HTN, CKD . Lacks social connections . Does not contact provider office for questions/concerns RN Care Manager Clinical Goal(s):  . patient will work with Consulting civil engineer, providers, and care team towards execution of optimized self-health management plan . patient will verbalize understanding of plan for effective management of HLD  . patient will work with [facility] and pcp  to address needs related to HLD . patient will attend all scheduled medical appointments: 02-06-2021 Interventions: . Collaboration with Venita Lick, NP regarding development and update of comprehensive plan of care as evidenced by provider attestation and co-signature . Inter-disciplinary care team collaboration (see longitudinal plan of care) . Medication review performed; medication list updated in electronic medical record.  Bertram Savin care team collaboration (see longitudinal plan of care) . Referred to pharmacy team for assistance with HLD medication management . Evaluation of current treatment plan related to HLD  and patient's adherence to plan as established by provider. . Advised patient to call the office for questions or concerns . Provided education to patient re: heart healthy diet, medication compliance and working with CCM team to optimize health and well being  . Reviewed scheduled/upcoming provider appointments including: 02-06-2021 . Discussed plans with patient for ongoing care management follow up and provided patient with direct contact information for care management team Patient Goals/Self-Care Activities: - call for medicine refill 2 or 3 days before it runs out - call if I am sick and can't take my medicine - keep a list of all the medicines I take; vitamins and herbals too - learn to read medicine labels - use a pillbox to sort medicine - use an alarm clock or phone to remind me to take my medicine - change to whole grain breads,  cereal, pasta - drink 6 to 8 glasses of water each day - eat 3 to 5 servings of fruits and vegetables each day - eat 5 or 6 small meals each day - fill half the plate with nonstarchy vegetables - limit fast food meals to no more than 1 per week - manage portion size - prepare main meal at home 3 to 5 days each week - be open to making changes - I can manage, know and watch for signs of a heart attack - if I have chest pain, call for help - learn about small changes that will make a big difference - learn my personal risk factors - barriers to meeting goals identified - change-talk evoked - choices provided - collaboration with team encouraged - decision-making supported - difficulty of making life-long changes acknowledged - health risks reviewed - problem-solving facilitated - questions answered - readiness for change evaluated - reassurance provided - self-reflection promoted - self-reliance encouraged Follow Up Plan: Telephone follow up appointment with care management team member scheduled for: 12-06-2020 at  1 pm     Task: RNCM: Mutually Develop and Royce Macadamia Achievement of Patient Goals   Note:   Care Management Activities:    - barriers to meeting  goals identified - change-talk evoked - choices provided - collaboration with team encouraged - decision-making supported - difficulty of making life-long changes acknowledged - health risks reviewed - problem-solving facilitated - questions answered - readiness for change evaluated - reassurance provided - self-reflection promoted - self-reliance encouraged         Patient verbalizes understanding of instructions provided today and agrees to view in [place].   Telephone follow up appointment with care management team member scheduled for:12-06-2020 at 1 pm  Noreene Larsson RN, MSN, [facility] Mobile: [phone]

## 2020-09-27 NOTE — Chronic Care Management (AMB) (Signed)
Chronic Care Management   CCM RN Visit Note  09/27/2020 Name: Rachel Cox MRN: [id number] DOB: Oct 17, 1950  Subjective: Rachel Cox is a 70 y.o. year old female who is a primary care patient of Cannady, Barbaraann Faster, NP. The care management team was consulted for assistance with disease management and care coordination needs.    Engaged with patient by telephone for follow up visit in response to provider referral for case management and/or care coordination services.   Consent to Services:  The patient was given information about Chronic Care Management services, agreed to services, and gave verbal consent prior to initiation of services.  Please see initial visit note for detailed documentation.   Patient agreed to services and verbal consent obtained.   Assessment: Review of patient past medical history, allergies, medications, health status, including review of consultants reports, laboratory and other test data, was performed as part of comprehensive evaluation and provision of chronic care management services.   SDOH (Social Determinants of Health) assessments and interventions performed:    CCM Care Plan  Allergies  Allergen Reactions  . Aspirin Nausea Only  . Flonase [Fluticasone Propionate]     Make glaucoma worse  . Codeine Palpitations    Pt vocalized    Outpatient Encounter Medications as of 09/27/2020  Medication Sig  . atorvastatin (LIPITOR) 40 MG tablet Take 1 tablet (40 mg total) by mouth at bedtime.  . Cholecalciferol 25 MCG (1000 UT) tablet Take 1 tablet (1,000 Units total) by mouth daily.  . dorzolamide-timolol (COSOPT) 22.3-6.8 MG/ML ophthalmic solution 1 drop 2 (two) times daily.  . folic acid (FOLVITE) 376 MCG tablet Take 400 mcg by mouth daily.  . hydrochlorothiazide (HYDRODIURIL) 25 MG tablet Take 1 tablet (25 mg total) by mouth daily.  Marland Kitchen levETIRAcetam (KEPPRA) 250 MG tablet Take 250 mg by mouth at bedtime.   Marland Kitchen levothyroxine (SYNTHROID) 75 MCG tablet Take  1 tablet (75 mcg total) by mouth daily. Take one tablet every morning  . losartan (COZAAR) 100 MG tablet Take 1 tablet (100 mg total) by mouth daily.  . mirtazapine (REMERON) 30 MG tablet Take 1 tablet (30 mg total) by mouth every morning.  . Multiple Vitamin (MULTIVITAMIN) tablet Take 1 tablet by mouth 3 (three) times a week.   . pantoprazole (PROTONIX) 40 MG tablet Take 1 tablet (40 mg total) by mouth daily.  . vitamin B-12 (CYANOCOBALAMIN) 500 MCG tablet Take 500 mcg by mouth daily.   No facility-administered encounter medications on file as of 09/27/2020.    Patient Active Problem List   Diagnosis Date Noted  . Primary open-angle glaucoma, bilateral, mild stage 08/01/2020  . History of scarlet fever 08/02/2019  . CKD (chronic kidney disease) stage 3, GFR 30-59 ml/min (HCC) 11/24/2017  . Obesity 11/24/2017  . Reflux esophagitis 10/31/2017  . History of Helicobacter pylori infection 10/14/2017  . Iron deficiency anemia 07/28/2017  . Vitamin D deficiency 03/02/2015  . Microalbuminuria 03/02/2015  . Thyroid nodule 03/02/2015  . Allergic dermatitis 12/22/2014  . Allergic rhinitis   . Insomnia   . Depression   . Anxiety   . Epilepsy ([place])   . Osteopenia   . Hyperlipemia 11/30/2013  . Essential hypertension 11/30/2013  . Hypothyroidism 11/30/2013  . B12 deficiency 11/30/2013    Conditions to be addressed/monitored:HTN and HLD  Care Plan : RNCM: Hypertension (Adult)  Updates made by Vanita Ingles since 09/27/2020 12:00 AM    Problem: RNCM: Hypertension (Hypertension)   Priority: Medium  Goal: Hypertension Monitored   Note:   Objective:  . Last practice recorded BP readings:  BP Readings from Last 3 Encounters:  08/01/20 116/78  02/02/20 127/85  10/19/19 (!) 154/86 .   Marland Kitchen Most recent eGFR/CrCl:  Lab Results  Component Value Date   EGFR 56 (L) 08/01/2020 .    No components found for: CRCL Current Barriers:  Marland Kitchen Knowledge Deficits related to basic understanding of  hypertension pathophysiology and self care management . Knowledge Deficits related to understanding of medications prescribed for management of hypertension . Does not contact provider office for questions/concerns Case Manager Clinical Goal(s):  . patient will verbalize understanding of plan for hypertension management . patient will attend all scheduled medical appointments: 02-06-2021 . patient will demonstrate improved adherence to prescribed treatment plan for hypertension as evidenced by taking all medications as prescribed, monitoring and recording blood pressure as directed, adhering to low sodium/DASH diet . patient will demonstrate improved health management independence as evidenced by checking blood pressure as directed and notifying PCP if SBP>160 or DBP > 90, taking all medications as prescribe, and adhering to a low sodium diet as discussed. . patient will verbalize basic understanding of hypertension disease process and self health management plan as evidenced by medication compliance, heart healthy diet compliance and working with the CCM team to manage health and well being.  Interventions:  . Collaboration with Venita Lick, NP regarding development and update of comprehensive plan of care as evidenced by provider attestation and co-signature . Inter-disciplinary care team collaboration (see longitudinal plan of care) . Evaluation of current treatment plan related to hypertension self management and patient's adherence to plan as established by provider. . Provided education to patient re: stroke prevention, s/s of heart attack and stroke, DASH diet, complications of uncontrolled blood pressure . Reviewed medications with patient and discussed importance of compliance . Discussed plans with patient for ongoing care management follow up and provided patient with direct contact information for care management team . Advised patient, providing education and rationale, to monitor  blood pressure daily and record, calling PCP for findings outside established parameters.  . Reviewed scheduled/upcoming provider appointments including: 02-06-2021 Self-Care Activities: - Self administers medications as prescribed Attends all scheduled provider appointments Calls provider office for new concerns, questions, or BP outside discussed parameters Checks BP and records as discussed Follows a low sodium diet/DASH diet Patient Goals: - check blood pressure weekly - choose a place to take my blood pressure (home, clinic or office, retail store) - write blood pressure results in a log or diary - agree on reward when goals are met - agree to work together to make changes - ask questions to understand - have a family meeting to talk about healthy habits - learn about high blood pressure - blood pressure trends reviewed - depression screen reviewed - home or ambulatory blood pressure monitoring encouraged  Follow Up Plan: Telephone follow up appointment with care management team member scheduled for: 12-06-2020 at 1 pm   Task: RNCM: Identify and Monitor Blood Pressure Elevation   Note:   Care Management Activities:    - blood pressure trends reviewed - depression screen reviewed - home or ambulatory blood pressure monitoring encouraged     Care Plan : RNCM: HLD Management  Updates made by Vanita Ingles since 09/27/2020 12:00 AM    Problem: RNCM: HLD Management   Priority: Medium    Long-Range Goal: RNCM: Self-Management Plan Developed   Priority: Medium  Note:  Current Barriers:  . Poorly controlled hyperlipidemia, complicated by history of seizures, HTN . Current antihyperlipidemic regimen: Lipitor 40 mg QD . Most recent lipid panel:     Component Value Date/Time   CHOL 166 08/01/2020 0903   TRIG 111 08/01/2020 0903   HDL 63 08/01/2020 0903   CHOLHDL 2.6 07/13/2018 1015   VLDL 16 01/06/2017 0928   LDLCALC 83 08/01/2020 0903   LDLCALC 103 (H) 07/13/2018 1015  .   Marland Kitchen ASCVD risk enhancing conditions: age >44, HTN, CKD . Lacks social connections . Does not contact provider office for questions/concerns RN Care Manager Clinical Goal(s):  . patient will work with Consulting civil engineer, providers, and care team towards execution of optimized self-health management plan . patient will verbalize understanding of plan for effective management of HLD  . patient will work with [facility] and pcp  to address needs related to HLD . patient will attend all scheduled medical appointments: 02-06-2021 Interventions: . Collaboration with Venita Lick, NP regarding development and update of comprehensive plan of care as evidenced by provider attestation and co-signature . Inter-disciplinary care team collaboration (see longitudinal plan of care) . Medication review performed; medication list updated in electronic medical record.  Bertram Savin care team collaboration (see longitudinal plan of care) . Referred to pharmacy team for assistance with HLD medication management . Evaluation of current treatment plan related to HLD  and patient's adherence to plan as established by provider. . Advised patient to call the office for questions or concerns . Provided education to patient re: heart healthy diet, medication compliance and working with CCM team to optimize health and well being  . Reviewed scheduled/upcoming provider appointments including: 02-06-2021 . Discussed plans with patient for ongoing care management follow up and provided patient with direct contact information for care management team Patient Goals/Self-Care Activities: - call for medicine refill 2 or 3 days before it runs out - call if I am sick and can't take my medicine - keep a list of all the medicines I take; vitamins and herbals too - learn to read medicine labels - use a pillbox to sort medicine - use an alarm clock or phone to remind me to take my medicine - change to whole grain breads,  cereal, pasta - drink 6 to 8 glasses of water each day - eat 3 to 5 servings of fruits and vegetables each day - eat 5 or 6 small meals each day - fill half the plate with nonstarchy vegetables - limit fast food meals to no more than 1 per week - manage portion size - prepare main meal at home 3 to 5 days each week - be open to making changes - I can manage, know and watch for signs of a heart attack - if I have chest pain, call for help - learn about small changes that will make a big difference - learn my personal risk factors - barriers to meeting goals identified - change-talk evoked - choices provided - collaboration with team encouraged - decision-making supported - difficulty of making life-long changes acknowledged - health risks reviewed - problem-solving facilitated - questions answered - readiness for change evaluated - reassurance provided - self-reflection promoted - self-reliance encouraged Follow Up Plan: Telephone follow up appointment with care management team member scheduled for: 12-06-2020 at  1 pm     Task: RNCM: Mutually Develop and Royce Macadamia Achievement of Patient Goals   Note:   Care Management Activities:    - barriers to meeting  goals identified - change-talk evoked - choices provided - collaboration with team encouraged - decision-making supported - difficulty of making life-long changes acknowledged - health risks reviewed - problem-solving facilitated - questions answered - readiness for change evaluated - reassurance provided - self-reflection promoted - self-reliance encouraged         Plan:Telephone follow up appointment with care management team member scheduled for:  12-06-2020 at 1 pm  [place], MSN, [facility] Mobile: [phone]

## 2020-10-11 ENCOUNTER — Ambulatory Visit: Payer: PPO | Admitting: Endocrinology

## 2020-10-11 ENCOUNTER — Other Ambulatory Visit: Payer: Self-pay

## 2020-10-11 VITALS — BP 150/78 | HR 85 | Ht 64.29 in | Wt 183.4 lb

## 2020-10-11 DIAGNOSIS — E039 Hypothyroidism, unspecified: Secondary | ICD-10-CM

## 2020-10-11 LAB — TSH: TSH: 1.52 u[IU]/mL (ref 0.35–4.50)

## 2020-10-11 LAB — T4, FREE: Free T4: 0.94 ng/dL (ref 0.60–1.60)

## 2020-10-11 NOTE — Progress Notes (Signed)
Subjective:    Patient ID: Rachel Cox, female    DOB: 04-Jun-1950, 70 y.o.   MRN: [id number]  HPI Pt returns for f/u of chronic primary hypothyroidism (dx'ed 2008; she has been on prescribed thyroid hormone therapy since then; she was on synthroid 150 mcg/day at one point, but she now requires less; US shows just 1 small nodule).  pt states she feels well in general.  She does not notice the goiter.   Past Medical History:  Diagnosis Date  . Allergic rhinitis   . Allergy   . Anemia   . Anxiety   . Depression   . Epilepsy ([place])    managed by Dr. Rexene Alberts 2015/2016  . GERD (gastroesophageal reflux disease)   . History of abnormal cervical Pap smear 1980's  . Hyperglycemia   . Hyperlipidemia   . Hypertension   . Hypothyroidism   . Insomnia   . Obesity (BMI 30.0-34.9) 01/06/2017  . Osteopenia   . Reflux esophagitis 10/31/2017   EGD    Past Surgical History:  Procedure Laterality Date  . COLONOSCOPY  Sept 2015  . COLONOSCOPY WITH PROPOFOL N/A 09/10/2017   Procedure: COLONOSCOPY WITH PROPOFOL;  Surgeon: Robert Bellow, MD;  Location: [facility] ENDOSCOPY;  Service: Endoscopy;  Laterality: N/A;  . Cryo Procedure  1980s   for abnormal pap  . ESOPHAGOGASTRODUODENOSCOPY (EGD) WITH PROPOFOL N/A 09/10/2017   Procedure: ESOPHAGOGASTRODUODENOSCOPY (EGD) WITH PROPOFOL;  Surgeon: Robert Bellow, MD;  Location: [facility] ENDOSCOPY;  Service: Endoscopy;  Laterality: N/A;  . ESOPHAGOGASTRODUODENOSCOPY (EGD) WITH PROPOFOL N/A 10/29/2017   Procedure: ESOPHAGOGASTRODUODENOSCOPY (EGD) WITH PROPOFOL;  Surgeon: Robert Bellow, MD;  Location: [facility] ENDOSCOPY;  Service: Endoscopy;  Laterality: N/A;  . EYE SURGERY Bilateral 2021   cataract removal  . LIPOMA EXCISION N/A 12/12/2017   Procedure: EXCISION BACK LIPOMA;  Surgeon: Robert Bellow, MD;  Location: [facility] ORS;  Service: General;  Laterality: N/A;  . [employer] History   Socioeconomic History  . Marital  status: Married    Spouse name: Girard Cooter  . Number of children: 4  . Years of education: Not on file  . Highest education level: 10th grade  Occupational History  . Occupation: Retired  Tobacco Use  . Smoking status: Never Smoker  . Smokeless tobacco: Never Used  Vaping Use  . Vaping Use: Never used  Substance and Sexual Activity  . Alcohol use: Yes    Comment: wine occ  . Drug use: No  . Sexual activity: Not Currently    Birth control/protection: Post-menopausal  Other Topics Concern  . Not on file  Social History Narrative  . Not on file   Social Determinants of Health   Financial Resource Strain: Low Risk   . Difficulty of Paying Living Expenses: Not hard at all  Food Insecurity: No Food Insecurity  . Worried About Charity fundraiser in the Last Year: Never true  . Ran Out of Food in the Last Year: Never true  Transportation Needs: No Transportation Needs  . Lack of Transportation (Medical): No  . Lack of Transportation (Non-Medical): No  Physical Activity: Inactive  . Days of Exercise per Week: 0 days  . Minutes of Exercise per Session: 0 min  Stress: No Stress Concern Present  . Feeling of Stress : Not at all  Social Connections: Not on file  Intimate Partner Violence: Not on file    Current Outpatient Medications on File Prior to Visit  Medication Sig Dispense Refill  . atorvastatin (LIPITOR) 40 MG tablet Take 1 tablet (40 mg total) by mouth at bedtime. 90 tablet 4  . Cholecalciferol 25 MCG (1000 UT) tablet Take 1 tablet (1,000 Units total) by mouth daily. 90 tablet 4  . dorzolamide-timolol (COSOPT) 22.3-6.8 MG/ML ophthalmic solution 1 drop 2 (two) times daily.    . folic acid (FOLVITE) 101 MCG tablet Take 400 mcg by mouth daily.    . hydrochlorothiazide (HYDRODIURIL) 25 MG tablet Take 1 tablet (25 mg total) by mouth daily. 90 tablet 4  . levETIRAcetam (KEPPRA) 250 MG tablet Take 250 mg by mouth at bedtime.     Marland Kitchen losartan (COZAAR) 100 MG tablet Take 1 tablet  (100 mg total) by mouth daily. 90 tablet 4  . mirtazapine (REMERON) 30 MG tablet Take 1 tablet (30 mg total) by mouth every morning. 90 tablet 4  . Multiple Vitamin (MULTIVITAMIN) tablet Take 1 tablet by mouth 3 (three) times a week.     . pantoprazole (PROTONIX) 40 MG tablet Take 1 tablet (40 mg total) by mouth daily. 90 tablet 4  . vitamin B-12 (CYANOCOBALAMIN) 500 MCG tablet Take 500 mcg by mouth daily.     No current facility-administered medications on file prior to visit.    Allergies  Allergen Reactions  . Aspirin Nausea Only  . Flonase [Fluticasone Propionate]     Make glaucoma worse  . Codeine Palpitations    Pt vocalized    Family History  Problem Relation Age of Onset  . Hyperlipidemia Mother   . Hypertension Mother   . CAD Mother   . Thyroid disease Mother   . Heart disease Mother   . COPD Father   . Thyroid disease Father   . Cancer Brother        liver  . Alcohol abuse Brother   . Hypertension Brother   . Liver disease Brother   . Thyroid disease Sister   . Diabetes Neg Hx   . Stroke Neg Hx     BP (!) 150/78 (BP Location: Right Arm, Patient Position: Sitting, Cuff Size: Large)   Pulse 85   Ht 5' 4.29" (1.633 m)   Wt 183 lb 6.4 oz (83.2 kg)   SpO2 96%   BMI 31.20 kg/m    Review of Systems     Objective:   Physical Exam VITAL SIGNS:  See vs page GENERAL: no distress NECK: There is no palpable thyroid enlargement.  No thyroid nodule is palpable.  No palpable lymphadenopathy at the anterior neck.   Lab Results  Component Value Date   TSH 1.52 10/11/2020   T3TOTAL 123 12/19/2014   T4TOTAL 14.2 (H) 12/19/2014       Assessment & Plan:  Hypothyroidism: well-controlled.  Please continue the same synthroid.

## 2020-10-11 NOTE — Patient Instructions (Addendum)
A thyroid blood test is requested for you today.  We'll let you know about the results.   Please come back for a follow-up appointment in 1 year.   

## 2020-10-12 ENCOUNTER — Encounter: Payer: Self-pay | Admitting: Endocrinology

## 2020-10-12 ENCOUNTER — Other Ambulatory Visit: Payer: Self-pay

## 2020-10-12 DIAGNOSIS — E039 Hypothyroidism, unspecified: Secondary | ICD-10-CM

## 2020-10-13 ENCOUNTER — Other Ambulatory Visit: Payer: Self-pay

## 2020-10-13 MED ORDER — LEVOTHYROXINE SODIUM 75 MCG PO TABS: 75.0000 ug | ORAL_TABLET | Freq: Every day | ORAL | 3 refills | Status: DC

## 2020-10-16 ENCOUNTER — Ambulatory Visit: Payer: PPO | Admitting: Endocrinology

## 2020-10-25 DIAGNOSIS — I10 Essential (primary) hypertension: Secondary | ICD-10-CM | POA: Diagnosis not present

## 2020-10-25 DIAGNOSIS — F439 Reaction to severe stress, unspecified: Secondary | ICD-10-CM | POA: Diagnosis not present

## 2020-10-25 DIAGNOSIS — R569 Unspecified convulsions: Secondary | ICD-10-CM | POA: Diagnosis not present

## 2020-10-25 DIAGNOSIS — F419 Anxiety disorder, unspecified: Secondary | ICD-10-CM | POA: Diagnosis not present

## 2020-11-06 DIAGNOSIS — H40153 Residual stage of open-angle glaucoma, bilateral: Secondary | ICD-10-CM | POA: Diagnosis not present

## 2020-12-06 ENCOUNTER — Ambulatory Visit (INDEPENDENT_AMBULATORY_CARE_PROVIDER_SITE_OTHER): Payer: PPO | Admitting: General Practice

## 2020-12-06 ENCOUNTER — Telehealth: Payer: Self-pay | Admitting: General Practice

## 2020-12-06 DIAGNOSIS — I10 Essential (primary) hypertension: Secondary | ICD-10-CM

## 2020-12-06 DIAGNOSIS — E782 Mixed hyperlipidemia: Secondary | ICD-10-CM | POA: Diagnosis not present

## 2020-12-06 NOTE — Chronic Care Management (AMB) (Signed)
Chronic Care Management   CCM RN Visit Note  12/06/2020 Name: Rachel Cox MRN: [id number] DOB: 02/04/1951  Subjective: Rachel Cox is a 70 y.o. year old female who is a primary care patient of Cannady, Barbaraann Faster, NP. The care management team was consulted for assistance with disease management and care coordination needs.    Engaged with patient by telephone for follow up visit in response to provider referral for case management and/or care coordination services.   Consent to Services:  The patient was given information about Chronic Care Management services, agreed to services, and gave verbal consent prior to initiation of services.  Please see initial visit note for detailed documentation.   Patient agreed to services and verbal consent obtained.   Assessment: Review of patient past medical history, allergies, medications, health status, including review of consultants reports, laboratory and other test data, was performed as part of comprehensive evaluation and provision of chronic care management services.   SDOH (Social Determinants of Health) assessments and interventions performed:    CCM Care Plan  Allergies  Allergen Reactions   Aspirin Nausea Only   Flonase [Fluticasone Propionate]     Make glaucoma worse   Codeine Palpitations    Pt vocalized    Outpatient Encounter Medications as of 12/06/2020  Medication Sig   atorvastatin (LIPITOR) 40 MG tablet Take 1 tablet (40 mg total) by mouth at bedtime.   Cholecalciferol 25 MCG (1000 UT) tablet Take 1 tablet (1,000 Units total) by mouth daily.   dorzolamide-timolol (COSOPT) 22.3-6.8 MG/ML ophthalmic solution 1 drop 2 (two) times daily.   folic acid (FOLVITE) 734 MCG tablet Take 400 mcg by mouth daily.   hydrochlorothiazide (HYDRODIURIL) 25 MG tablet Take 1 tablet (25 mg total) by mouth daily.   levETIRAcetam (KEPPRA) 250 MG tablet Take 250 mg by mouth at bedtime.    levothyroxine (SYNTHROID) 75 MCG tablet Take 1 tablet (75  mcg total) by mouth daily. Take one tablet every morning   losartan (COZAAR) 100 MG tablet Take 1 tablet (100 mg total) by mouth daily.   mirtazapine (REMERON) 30 MG tablet Take 1 tablet (30 mg total) by mouth every morning.   Multiple Vitamin (MULTIVITAMIN) tablet Take 1 tablet by mouth 3 (three) times a week.    pantoprazole (PROTONIX) 40 MG tablet Take 1 tablet (40 mg total) by mouth daily.   vitamin B-12 (CYANOCOBALAMIN) 500 MCG tablet Take 500 mcg by mouth daily.   No facility-administered encounter medications on file as of 12/06/2020.    Patient Active Problem List   Diagnosis Date Noted   Primary open-angle glaucoma, bilateral, mild stage 08/01/2020   History of scarlet fever 08/02/2019   CKD (chronic kidney disease) stage 3, GFR 30-59 ml/min (HCC) 11/24/2017   Obesity 11/24/2017   Reflux esophagitis 28/76/8115   History of Helicobacter pylori infection 10/14/2017   Iron deficiency anemia 07/28/2017   Vitamin D deficiency 03/02/2015   Microalbuminuria 03/02/2015   Thyroid nodule 03/02/2015   Allergic dermatitis 12/22/2014   Allergic rhinitis    Insomnia    Depression    Anxiety    Epilepsy ([place])    Osteopenia    Hyperlipemia 11/30/2013   Essential hypertension 11/30/2013   Hypothyroidism 11/30/2013   B12 deficiency 11/30/2013    Conditions to be addressed/monitored:HTN and HLD  Care Plan : RNCM: Hypertension (Adult)  Updates made by Vanita Ingles since 12/06/2020 12:00 AM     Problem: RNCM: Hypertension (Hypertension)   Priority: Medium  Long-Range Goal: Hypertension Monitored   Start Date: 09/21/2020  Expected End Date: 12/01/2021  This Visit's Progress: On track  Priority: Medium  Note:   Objective:  Last practice recorded BP readings:  BP Readings from Last 3 Encounters:  10/11/20 (!) 150/78  08/01/20 116/78  02/02/20 127/85    Most recent eGFR/CrCl:  Lab Results  Component Value Date   EGFR 56 (L) 08/01/2020    No components found for:  CRCL Current Barriers:  Knowledge Deficits related to basic understanding of hypertension pathophysiology and self care management Knowledge Deficits related to understanding of medications prescribed for management of hypertension Does not contact provider office for questions/concerns Case Manager Clinical Goal(s):  patient will verbalize understanding of plan for hypertension management patient will attend all scheduled medical appointments: 02-06-2021 patient will demonstrate improved adherence to prescribed treatment plan for hypertension as evidenced by taking all medications as prescribed, monitoring and recording blood pressure as directed, adhering to low sodium/DASH diet patient will demonstrate improved health management independence as evidenced by checking blood pressure as directed and notifying PCP if SBP>160 or DBP > 90, taking all medications as prescribe, and adhering to a low sodium diet as discussed. patient will verbalize basic understanding of hypertension disease process and self health management plan as evidenced by medication compliance, heart healthy diet compliance and working with the CCM team to manage health and well being.  Interventions:  Collaboration with Venita Lick, NP regarding development and update of comprehensive plan of care as evidenced by provider attestation and co-signature Inter-disciplinary care team collaboration (see longitudinal plan of care) Evaluation of current treatment plan related to hypertension self management and patient's adherence to plan as established by provider. 12-06-2020: The patient states that her blood pressures have been good at home but she does have white coat syndrome and her blood pressure shoots up at the provider office. Education and support given.  Provided education to patient re: stroke prevention, s/s of heart attack and stroke, DASH diet, complications of uncontrolled blood pressure Reviewed medications with  patient and discussed importance of compliance Discussed plans with patient for ongoing care management follow up and provided patient with direct contact information for care management team Advised patient, providing education and rationale, to monitor blood pressure daily and record, calling PCP for findings outside established parameters.  Reviewed scheduled/upcoming provider appointments including: 02-06-2021 Self-Care Activities: - Self administers medications as prescribed Attends all scheduled provider appointments Calls provider office for new concerns, questions, or BP outside discussed parameters Checks BP and records as discussed Follows a low sodium diet/DASH diet Patient Goals: - check blood pressure weekly - choose a place to take my blood pressure (home, clinic or office, retail store) - write blood pressure results in a log or diary - agree on reward when goals are met - agree to work together to make changes - ask questions to understand - have a family meeting to talk about healthy habits - learn about high blood pressure - blood pressure trends reviewed - depression screen reviewed - home or ambulatory blood pressure monitoring encouraged  Follow Up Plan: Telephone follow up appointment with care management team member scheduled for: 02-27-2021 at 1 pm    Task: RNCM: Identify and Monitor Blood Pressure Elevation Completed 12/06/2020  Outcome: Positive  Note:   Care Management Activities:    - blood pressure trends reviewed - depression screen reviewed - home or ambulatory blood pressure monitoring encouraged      Care Plan : RNCM:  HLD Management  Updates made by Vanita Ingles since 12/06/2020 12:00 AM     Problem: RNCM: HLD Management   Priority: Medium     Long-Range Goal: RNCM: Self-Management Plan Developed   Start Date: 09/21/2020  Expected End Date: 10/22/2021  This Visit's Progress: On track  Priority: Medium  Note:   Current Barriers:  Poorly  controlled hyperlipidemia, complicated by history of seizures, HTN Current antihyperlipidemic regimen: Lipitor 40 mg QD Most recent lipid panel:     Component Value Date/Time   CHOL 166 08/01/2020 0903   TRIG 111 08/01/2020 0903   HDL 63 08/01/2020 0903   CHOLHDL 2.6 07/13/2018 1015   VLDL 16 01/06/2017 0928   LDLCALC 83 08/01/2020 0903   LDLCALC 103 (H) 07/13/2018 1015   ASCVD risk enhancing conditions: age >41, HTN, CKD Lacks social connections Does not contact provider office for questions/concerns RN Care Manager Clinical Goal(s):  patient will work with Consulting civil engineer, providers, and care team towards execution of optimized self-health management plan patient will verbalize understanding of plan for effective management of HLD  patient will work with [facility] and pcp  to address needs related to HLD patient will attend all scheduled medical appointments: 02-06-2021 Interventions: Collaboration with Venita Lick, NP regarding development and update of comprehensive plan of care as evidenced by provider attestation and co-signature Inter-disciplinary care team collaboration (see longitudinal plan of care) Medication review performed; medication list updated in electronic medical record.  Inter-disciplinary care team collaboration (see longitudinal plan of care) Referred to pharmacy team for assistance with HLD medication management Evaluation of current treatment plan related to HLD  and patient's adherence to plan as established by provider. 12-06-2020: The patient states that she is doing well. Denies any issues with medications compliance or dietary restrictions. Will continue to monitor for changes.  Advised patient to call the office for questions or concerns Provided education to patient re: heart healthy diet, medication compliance and working with CCM team to optimize health and well being  Reviewed scheduled/upcoming provider appointments including: 02-06-2021 Discussed plans  with patient for ongoing care management follow up and provided patient with direct contact information for care management team Patient Goals/Self-Care Activities: - call for medicine refill 2 or 3 days before it runs out - call if I am sick and can't take my medicine - keep a list of all the medicines I take; vitamins and herbals too - learn to read medicine labels - use a pillbox to sort medicine - use an alarm clock or phone to remind me to take my medicine - change to whole grain breads, cereal, pasta - drink 6 to 8 glasses of water each day - eat 3 to 5 servings of fruits and vegetables each day - eat 5 or 6 small meals each day - fill half the plate with nonstarchy vegetables - limit fast food meals to no more than 1 per week - manage portion size - prepare main meal at home 3 to 5 days each week - be open to making changes - I can manage, know and watch for signs of a heart attack - if I have chest pain, call for help - learn about small changes that will make a big difference - learn my personal risk factors - barriers to meeting goals identified - change-talk evoked - choices provided - collaboration with team encouraged - decision-making supported - difficulty of making life-long changes acknowledged - health risks reviewed - problem-solving facilitated - questions answered -  readiness for change evaluated - reassurance provided - self-reflection promoted - self-reliance encouraged Follow Up Plan: Telephone follow up appointment with care management team member scheduled for: 02-27-2021 at  1 pm      Task: RNCM: Mutually Develop and Royce Macadamia Achievement of Patient Goals Completed 12/06/2020  Outcome: Positive  Note:   Care Management Activities:    - barriers to meeting goals identified - change-talk evoked - choices provided - collaboration with team encouraged - decision-making supported - difficulty of making life-long changes acknowledged - health risks  reviewed - problem-solving facilitated - questions answered - readiness for change evaluated - reassurance provided - self-reflection promoted - self-reliance encouraged         Plan:Telephone follow up appointment with care management team member scheduled for:  01-09-26-2022 at 1 pm  [place], MSN, [facility] Mobile: [phone]

## 2020-12-06 NOTE — Patient Instructions (Signed)
Visit Information  PATIENT GOALS:  Goals Addressed             This Visit's Progress    RNCM: Track and Manage My Blood Pressure-Hypertension       Timeframe:  Long-Range Goal Priority:  High Start Date:      12-06-2020                       Expected End Date:        12-06-2021               Follow Up Date 02/27/2021    - check blood pressure daily - choose a place to take my blood pressure (home, clinic or office, retail store) - write blood pressure results in a log or diary    Why is this important?   You won't feel high blood pressure, but it can still hurt your blood vessels.  High blood pressure can cause heart or kidney problems. It can also cause a stroke.  Making lifestyle changes like losing a little weight or eating less salt will help.  Checking your blood pressure at home and at different times of the day can help to control blood pressure.  If the doctor prescribes medicine remember to take it the way the doctor ordered.  Call the office if you cannot afford the medicine or if there are questions about it.     Notes: 12-06-2020: The patient has white coat syndrome and her blood pressure is generally high in the provider office but WNL at home. Education and support given. Will continue to monitor.          Patient verbalizes understanding of instructions provided today and agrees to view in [place].   Telephone follow up appointment with care management team member scheduled for: 02-27-2021 at 1 pm  Noreene Larsson RN, MSN, [facility] Mobile: [phone]

## 2021-02-03 ENCOUNTER — Encounter: Payer: Self-pay | Admitting: Nurse Practitioner

## 2021-02-06 ENCOUNTER — Ambulatory Visit (INDEPENDENT_AMBULATORY_CARE_PROVIDER_SITE_OTHER): Payer: PPO | Admitting: Nurse Practitioner

## 2021-02-06 ENCOUNTER — Encounter: Payer: Self-pay | Admitting: Nurse Practitioner

## 2021-02-06 ENCOUNTER — Other Ambulatory Visit: Payer: Self-pay

## 2021-02-06 VITALS — BP 136/81 | HR 85 | Temp 99.1°F | Ht 64.0 in | Wt 186.8 lb

## 2021-02-06 DIAGNOSIS — D508 Other iron deficiency anemias: Secondary | ICD-10-CM

## 2021-02-06 DIAGNOSIS — R809 Proteinuria, unspecified: Secondary | ICD-10-CM

## 2021-02-06 DIAGNOSIS — E538 Deficiency of other specified B group vitamins: Secondary | ICD-10-CM

## 2021-02-06 DIAGNOSIS — G40802 Other epilepsy, not intractable, without status epilepticus: Secondary | ICD-10-CM

## 2021-02-06 DIAGNOSIS — H401131 Primary open-angle glaucoma, bilateral, mild stage: Secondary | ICD-10-CM

## 2021-02-06 DIAGNOSIS — E782 Mixed hyperlipidemia: Secondary | ICD-10-CM

## 2021-02-06 DIAGNOSIS — E039 Hypothyroidism, unspecified: Secondary | ICD-10-CM

## 2021-02-06 DIAGNOSIS — F5105 Insomnia due to other mental disorder: Secondary | ICD-10-CM

## 2021-02-06 DIAGNOSIS — K2100 Gastro-esophageal reflux disease with esophagitis, without bleeding: Secondary | ICD-10-CM | POA: Diagnosis not present

## 2021-02-06 DIAGNOSIS — Z0000 Encounter for general adult medical examination without abnormal findings: Secondary | ICD-10-CM | POA: Diagnosis not present

## 2021-02-06 DIAGNOSIS — I10 Essential (primary) hypertension: Secondary | ICD-10-CM | POA: Diagnosis not present

## 2021-02-06 DIAGNOSIS — F320 Major depressive disorder, single episode, mild: Secondary | ICD-10-CM | POA: Diagnosis not present

## 2021-02-06 DIAGNOSIS — E559 Vitamin D deficiency, unspecified: Secondary | ICD-10-CM

## 2021-02-06 DIAGNOSIS — M8580 Other specified disorders of bone density and structure, unspecified site: Secondary | ICD-10-CM | POA: Diagnosis not present

## 2021-02-06 DIAGNOSIS — F419 Anxiety disorder, unspecified: Secondary | ICD-10-CM

## 2021-02-06 DIAGNOSIS — E6609 Other obesity due to excess calories: Secondary | ICD-10-CM

## 2021-02-06 DIAGNOSIS — Z6832 Body mass index (BMI) 32.0-32.9, adult: Secondary | ICD-10-CM

## 2021-02-06 DIAGNOSIS — N1831 Chronic kidney disease, stage 3a: Secondary | ICD-10-CM

## 2021-02-06 LAB — MICROALBUMIN, URINE WAIVED
Creatinine, Urine Waived: 300 mg/dL (ref 10–300)
Microalb, Ur Waived: 30 mg/L — ABNORMAL HIGH (ref 0–19)
Microalb/Creat Ratio: <30 mg/g (ref <30)

## 2021-02-06 MED ORDER — LOSARTAN POTASSIUM 100 MG PO TABS: 100.0000 mg | ORAL_TABLET | Freq: Every day | ORAL | 4 refills | Status: DC

## 2021-02-06 MED ORDER — ATORVASTATIN CALCIUM 40 MG PO TABS: 40.0000 mg | ORAL_TABLET | Freq: Every day | ORAL | 4 refills | Status: DC

## 2021-02-06 MED ORDER — PANTOPRAZOLE SODIUM 40 MG PO TBEC: 40.0000 mg | DELAYED_RELEASE_TABLET | Freq: Every day | ORAL | 4 refills | Status: DC

## 2021-02-06 MED ORDER — HYDROCHLOROTHIAZIDE 25 MG PO TABS: 25.0000 mg | ORAL_TABLET | Freq: Every day | ORAL | 4 refills | Status: DC

## 2021-02-06 NOTE — Assessment & Plan Note (Signed)
Chronic, ongoing.  Continue current medication regimen and adjust as needed.  Lipid panel today. Return in 6 months for follow-up.  Refills sent in.

## 2021-02-06 NOTE — Assessment & Plan Note (Signed)
Chronic, ongoing.  Continue daily supplement and adjust as needed.  Check B12 level today. 

## 2021-02-06 NOTE — Assessment & Plan Note (Signed)
Ongoing with DEXA last in 2020 -- repeat in May 2025.  Continue Vit D daily and recommend fall precautions.  Check Vit D level today.

## 2021-02-06 NOTE — Patient Instructions (Signed)

## 2021-02-06 NOTE — Assessment & Plan Note (Signed)
Chronic, ongoing.  Continue current medication regimen, Remeron and Lexapro, and adjust as needed -- currently followed by neurology for this.  Denies SI/HI.  Refills performed by neurology who prescribes.  Return in 6 months for follow-up.

## 2021-02-06 NOTE — Assessment & Plan Note (Signed)
Chronic, stable.  Continue current medication regimen as recommended by neuro and collaboration with them.  CMP today.  Recent neuro note reviewed.

## 2021-02-06 NOTE — Assessment & Plan Note (Signed)
Continue Losartan for kidney protection, check urine ALB today. 

## 2021-02-06 NOTE — Assessment & Plan Note (Addendum)
Chronic, ongoing.  Continue current medication regimen as recommended by Dr. Lissa Merlin and collaboration with endocrinology.  Recent notes and labs reviewed.

## 2021-02-06 NOTE — Assessment & Plan Note (Signed)
Chronic, stable.  Check CBC, ferritin, iron, and B12 today.  Continue daily supplement and adjust plan of care as needed based on labs. 

## 2021-02-06 NOTE — Assessment & Plan Note (Signed)
Chronic, ongoing with BP at goal for age today and on occasional home readings at goal.  Recommend she monitor BP at least a few mornings a week at home and document.  DASH diet at home.  Continue current medication regimen and adjust as needed.  Labs today: CMP, CBC, Lipid.  Refills sent in.  Return in 6 months.

## 2021-02-06 NOTE — Progress Notes (Addendum)
BP 136/81   Pulse 85   Temp 99.1 F (37.3 C) (Oral)   Ht _0  (1.626 m)   Wt 186 lb 12.8 oz (84.7 kg)   SpO2 96%   BMI 32.06 kg/m    Subjective:    Patient ID: Rachel Cox, female    DOB: 07/09/1950, 70 y.o.   MRN: [id number]  HPI: Rachel Cox is a 70 y.o. female presenting on 02/06/2021 for comprehensive medical examination. Current medical complaints include:none  She currently lives with: husband Menopausal Symptoms: no   SEIZURE DISORDER Followed by Dr. Melrose Nakayama, recently saw in February 2022.  Has had no seizure since 2016. Keppra 250 MG at bedtime, which was refilled by them.  They have her on Remeron for sleep and recently started Lexapro for mood.   HYPERTENSION / HYPERLIPIDEMIA Continues on Losartan, HCTZ and Atorvastatin.   Satisfied with current treatment? yes Duration of hypertension: chronic BP monitoring frequency: once a week BP range: 130/70-80 BP medication side effects: yes Duration of hyperlipidemia: chronic Cholesterol medication side effects: no Cholesterol supplements: none Medication compliance: good compliance Aspirin: no Recent stressors: no Recurrent headaches: no Visual changes: no Palpitations: no Dyspnea: no Chest pain: no Lower extremity edema: no Dizzy/lightheaded: no    ANEMIA Continues on supplements. Anemia status: stable Etiology of anemia: Duration of anemia treatment:  Compliance with treatment: good compliance Iron supplementation side effects: no Severity of anemia: mild Fatigue: no Decreased exercise tolerance: no  Dyspnea on exertion: no Palpitations: no Bleeding: no Pica: no    CHRONIC KIDNEY DISEASE Last GFR 56 and CRT 1.07 -- March 2022.    Has underlying osteopenia noted on DEXA August 2020, continues on daily supplements. CKD status: stable Medications renally dose: yes Previous renal evaluation: no Pneumovax:  Up to Date Influenza Vaccine:  Up to Date    HYPOTHYROIDISM Last TSH 1.52 -- followed by  Dr. Lissa Merlin and last seen 10/11/20. Thyroid control status:stable Satisfied with current treatment? yes Medication side effects: no Medication compliance: good compliance Etiology of hypothyroidism:  Recent dose adjustment:no Fatigue: no Cold intolerance: no Heat intolerance: no Weight gain: no Weight loss: no Constipation: no Diarrhea/loose stools: no Palpitations: no Lower extremity edema: no Anxiety/depressed mood: no    ANXIETY/STRESS Continues on Remeron 30 MG for mood and insomnia and Lexapro was started by neurology, has no ADR with this.   Duration:stable Anxious mood: no  Excessive worrying: no Irritability: no  Sweating: no Nausea: no Palpitations:no Hyperventilation: no Panic attacks: no Agoraphobia: no  Obscessions/compulsions: no Depressed mood: no Depression screen [facility] 2/9 02/06/2021 08/01/2020 07/07/2020 02/02/2020 10/13/2019  Decreased Interest 0 0 0 0 0  Down, Depressed, Hopeless 0 0 0 0 0  PHQ - 2 Score 0 0 0 0 0  Altered sleeping 0 0 0 0 -  Tired, decreased energy 0 0 0 0 -  Change in appetite 0 1 0 0 -  Feeling bad or failure about yourself  0 0 0 0 -  Trouble concentrating 0 0 0 0 -  Moving slowly or fidgety/restless 0 0 0 0 -  Suicidal thoughts 0 0 0 0 -  PHQ-9 Score 0 1 0 0 -  Difficult doing work/chores - - Not difficult at all Not difficult at all -  Some recent data might be hidden   GAD 7 : Generalized Anxiety Score 02/02/2020 08/02/2019  Nervous, Anxious, on Edge 0 1  Control/stop worrying 0 0  Worry too much - different things 0 0  Trouble relaxing 0 0  Restless 0 0  Easily annoyed or irritable 0 1  Afraid - awful might happen 0 0  Total GAD 7 Score 0 2  Anxiety Difficulty Not difficult at all Not difficult at all     Functional Status Survey: Is the patient deaf or have difficulty hearing?: No Does the patient have difficulty seeing, even when wearing glasses/contacts?: No Does the patient have difficulty concentrating, remembering, or  making decisions?: No Does the patient have difficulty walking or climbing stairs?: No Does the patient have difficulty dressing or bathing?: No Does the patient have difficulty doing errands alone such as visiting a doctor's office or shopping?: No   Fall Risk  02/06/2021 08/01/2020 07/07/2020 02/01/2019 01/04/2019  Falls in the past year? 0 0 0 0 0  Number falls in past yr: 0 - - - 0  Injury with Fall? 0 0 - - 0  Risk for fall due to : No Fall Risks - Medication side effect - -  Follow up Falls prevention discussed - Falls evaluation completed;Education provided;Falls prevention discussed - Falls evaluation completed    Past Medical History:  Past Medical History:  Diagnosis Date   Allergic rhinitis    Allergy    Anemia    Anxiety    Depression    Epilepsy ([place])    managed by Dr. Rexene Alberts 2015/2016   GERD (gastroesophageal reflux disease)    History of abnormal cervical Pap smear 1980's   Hyperglycemia    Hyperlipidemia    Hypertension    Hypothyroidism    Insomnia    Obesity (BMI 30.0-34.9) 01/06/2017   Osteopenia    Reflux esophagitis 10/31/2017   EGD    Surgical History:  Past Surgical History:  Procedure Laterality Date   COLONOSCOPY  Sept 2015   COLONOSCOPY WITH PROPOFOL N/A 09/10/2017   Procedure: COLONOSCOPY WITH PROPOFOL;  Surgeon: Robert Bellow, MD;  Location: [facility] ENDOSCOPY;  Service: Endoscopy;  Laterality: N/A;   Cryo Procedure  1980s   for abnormal pap   ESOPHAGOGASTRODUODENOSCOPY (EGD) WITH PROPOFOL N/A 09/10/2017   Procedure: ESOPHAGOGASTRODUODENOSCOPY (EGD) WITH PROPOFOL;  Surgeon: Robert Bellow, MD;  Location: [facility] ENDOSCOPY;  Service: Endoscopy;  Laterality: N/A;   ESOPHAGOGASTRODUODENOSCOPY (EGD) WITH PROPOFOL N/A 10/29/2017   Procedure: ESOPHAGOGASTRODUODENOSCOPY (EGD) WITH PROPOFOL;  Surgeon: Robert Bellow, MD;  Location: [facility] ENDOSCOPY;  Service: Endoscopy;  Laterality: N/A;   EYE SURGERY Bilateral 2021   cataract removal   LIPOMA  EXCISION N/A 12/12/2017   Procedure: EXCISION BACK LIPOMA;  Surgeon: Robert Bellow, MD;  Location: [facility] ORS;  Service: General;  Laterality: N/A;   TUBAL LIGATION  1969    Medications:  Current Outpatient Medications on File Prior to Visit  Medication Sig   Cholecalciferol 25 MCG (1000 UT) tablet Take 1 tablet (1,000 Units total) by mouth daily.   dorzolamide (TRUSOPT) 2 % ophthalmic solution 1 drop 2 (two) times daily.   dorzolamide-timolol (COSOPT) 22.3-6.8 MG/ML ophthalmic solution 1 drop 2 (two) times daily.   escitalopram (LEXAPRO) 10 MG tablet Take 10 mg by mouth daily.   folic acid (FOLVITE) 314 MCG tablet Take 400 mcg by mouth daily.   levETIRAcetam (KEPPRA) 250 MG tablet Take 250 mg by mouth at bedtime.    levothyroxine (SYNTHROID) 75 MCG tablet Take 1 tablet (75 mcg total) by mouth daily. Take one tablet every morning   mirtazapine (REMERON) 30 MG tablet Take 1 tablet (30 mg total) by mouth every morning.  Multiple Vitamin (MULTIVITAMIN) tablet Take 1 tablet by mouth 3 (three) times a week.    vitamin B-12 (CYANOCOBALAMIN) 500 MCG tablet Take 500 mcg by mouth daily.   No current facility-administered medications on file prior to visit.    Allergies:  Allergies  Allergen Reactions   Aspirin Nausea Only   Flonase [Fluticasone Propionate]     Make glaucoma worse   Codeine Palpitations    Pt vocalized    Social History:  Social History   Socioeconomic History   Marital status: Married    Spouse name: Girard Cooter   Number of children: 4   Years of education: Not on file   Highest education level: 10th grade  Occupational History   Occupation: Retired  Tobacco Use   Smoking status: Never   Smokeless tobacco: Never  Vaping Use   Vaping Use: Never used  Substance and Sexual Activity   Alcohol use: Yes    Comment: wine occ   Drug use: No   Sexual activity: Not Currently    Birth control/protection: Post-menopausal  Other Topics Concern   Not on file  Social  History Narrative   Not on file   Social Determinants of Health   Financial Resource Strain: Low Risk    Difficulty of Paying Living Expenses: Not hard at all  Food Insecurity: No Food Insecurity   Worried About Charity fundraiser in the Last Year: Never true   [place] in the Last Year: Never true  Transportation Needs: No Transportation Needs   Lack of Transportation (Medical): No   Lack of Transportation (Non-Medical): No  Physical Activity: Inactive   Days of Exercise per Week: 0 days   Minutes of Exercise per Session: 0 min  Stress: No Stress Concern Present   Feeling of Stress : Not at all  Social Connections: Moderately Integrated   Frequency of Communication with Friends and Family: More than three times a week   Frequency of Social Gatherings with Friends and Family: More than three times a week   Attends Religious Services: More than 4 times per year   Active Member of [employer] or Organizations: No   Attends Music therapist: Never   Marital Status: Married  Human resources officer Violence: Not At Risk   Fear of Current or Ex-Partner: No   Emotionally Abused: No   Physically Abused: No   Sexually Abused: No   Social History   Tobacco Use  Smoking Status Never  Smokeless Tobacco Never   Social History   Substance and Sexual Activity  Alcohol Use Yes   Comment: wine occ    Family History:  Family History  Problem Relation Age of Onset   Hyperlipidemia Mother    Hypertension Mother    CAD Mother    Thyroid disease Mother    Heart disease Mother    COPD Father    Thyroid disease Father    Cancer Brother        liver   Alcohol abuse Brother    Hypertension Brother    Liver disease Brother    Thyroid disease Sister    Diabetes Neg Hx    Stroke Neg Hx     Past medical history, surgical history, medications, allergies, family history and social history reviewed with patient today and changes made to appropriate areas of the chart.    Review of Systems - negative All other ROS negative except what is listed above and in the HPI.  Objective:    BP 136/81   Pulse 85   Temp 99.1 F (37.3 C) (Oral)   Ht _0  (1.626 m)   Wt 186 lb 12.8 oz (84.7 kg)   SpO2 96%   BMI 32.06 kg/m   Wt Readings from Last 3 Encounters:  05/04/21 187 lb (84.8 kg)  02/06/21 186 lb 12.8 oz (84.7 kg)  10/11/20 183 lb 6.4 oz (83.2 kg)    Physical Exam Constitutional:      General: She is awake. She is not in acute distress.    Appearance: She is well-developed. She is not ill-appearing.  HENT:     Head: Normocephalic and atraumatic.     Right Ear: Hearing, tympanic membrane, ear canal and external ear normal. No drainage.     Left Ear: Hearing, tympanic membrane, ear canal and external ear normal. No drainage.     Nose: Nose normal.     Right Sinus: No maxillary sinus tenderness or frontal sinus tenderness.     Left Sinus: No maxillary sinus tenderness or frontal sinus tenderness.     Mouth/Throat:     Mouth: Mucous membranes are moist.     Pharynx: Oropharynx is clear. Uvula midline. No pharyngeal swelling, oropharyngeal exudate or posterior oropharyngeal erythema.  Eyes:     General: Lids are normal.        Right eye: No discharge.        Left eye: No discharge.     Extraocular Movements: Extraocular movements intact.     Conjunctiva/sclera: Conjunctivae normal.     Pupils: Pupils are equal, round, and reactive to light.     Visual Fields: Right eye visual fields normal and left eye visual fields normal.  Neck:     Thyroid: No thyromegaly.     Vascular: No carotid bruit.     Trachea: Trachea normal.  Cardiovascular:     Rate and Rhythm: Normal rate and regular rhythm.     Heart sounds: Normal heart sounds. No murmur heard.   No gallop.  Pulmonary:     Effort: Pulmonary effort is normal. No accessory muscle usage or respiratory distress.     Breath sounds: Normal breath sounds.  Abdominal:     General: Bowel sounds  are normal.     Palpations: Abdomen is soft. There is no hepatomegaly or splenomegaly.     Tenderness: There is no abdominal tenderness.  Musculoskeletal:        General: Normal range of motion.     Cervical back: Normal range of motion and neck supple.     Right lower leg: No edema.     Left lower leg: No edema.  Lymphadenopathy:     Head:     Right side of head: No submental, submandibular, tonsillar, preauricular or posterior auricular adenopathy.     Left side of head: No submental, submandibular, tonsillar, preauricular or posterior auricular adenopathy.     Cervical: No cervical adenopathy.  Skin:    General: Skin is warm and dry.     Capillary Refill: Capillary refill takes less than 2 seconds.     Findings: No rash.  Neurological:     Mental Status: She is alert and oriented to person, place, and time.     Cranial Nerves: Cranial nerves are intact.     Gait: Gait is intact.     Deep Tendon Reflexes: Reflexes are normal and symmetric.     Reflex Scores:      Brachioradialis reflexes are 2+  on the right side and 2+ on the left side.      Patellar reflexes are 2+ on the right side and 2+ on the left side. Psychiatric:        Attention and Perception: Attention normal.        Mood and Affect: Mood normal.        Speech: Speech normal.        Behavior: Behavior normal. Behavior is cooperative.        Thought Content: Thought content normal.        Judgment: Judgment normal.    Results for orders placed or performed in visit on 02/06/21  CBC with Differential/Platelet  Result Value Ref Range   WBC 5.4 3.4 - 10.8 x10E3/uL   RBC 4.89 3.77 - 5.28 x10E6/uL   Hemoglobin 14.7 11.1 - 15.9 g/dL   Hematocrit 44.9 34.0 - 46.6 %   MCV 92 79 - 97 fL   MCH 30.1 26.6 - 33.0 pg   MCHC 32.7 31.5 - 35.7 g/dL   RDW 13.1 11.7 - 15.4 %   Platelets 293 150 - 450 x10E3/uL   Neutrophils 49 Not Estab. %   Lymphs 37 Not Estab. %   Monocytes 11 Not Estab. %   Eos 2 Not Estab. %   Basos 1  Not Estab. %   Neutrophils Absolute 2.7 1.4 - 7.0 x10E3/uL   Lymphocytes Absolute 2.0 0.7 - 3.1 x10E3/uL   Monocytes Absolute 0.6 0.1 - 0.9 x10E3/uL   EOS (ABSOLUTE) 0.1 0.0 - 0.4 x10E3/uL   Basophils Absolute 0.0 0.0 - 0.2 x10E3/uL   Immature Granulocytes 0 Not Estab. %   Immature Grans (Abs) 0.0 0.0 - 0.1 x10E3/uL  Comprehensive metabolic panel  Result Value Ref Range   Glucose 101 (H) 65 - 99 mg/dL   BUN 22 8 - 27 mg/dL   Creatinine, Ser 1.19 (H) 0.57 - 1.00 mg/dL   eGFR 49 (L) >59 mL/min/1.73   BUN/Creatinine Ratio 18 12 - 28   Sodium 140 134 - 144 mmol/L   Potassium 3.4 (L) 3.5 - 5.2 mmol/L   Chloride 100 96 - 106 mmol/L   CO2 26 20 - 29 mmol/L   Calcium 10.2 8.7 - 10.3 mg/dL   Total Protein 7.1 6.0 - 8.5 g/dL   Albumin 4.6 3.7 - 4.7 g/dL   Globulin, Total 2.5 1.5 - 4.5 g/dL   Albumin/Globulin Ratio 1.8 1.2 - 2.2   Bilirubin Total 0.6 0.0 - 1.2 mg/dL   Alkaline Phosphatase 98 44 - 121 IU/L   AST 20 0 - 40 IU/L   ALT 13 0 - 32 IU/L  Lipid Panel w/o Chol/HDL Ratio  Result Value Ref Range   Cholesterol, Total 186 100 - 199 mg/dL   Triglycerides 105 0 - 149 mg/dL   HDL 79 >39 mg/dL   VLDL Cholesterol Cal 18 5 - 40 mg/dL   LDL Chol Calc (NIH) 89 0 - 99 mg/dL  VITAMIN D 25 Hydroxy (Vit-D Deficiency, Fractures)  Result Value Ref Range   Vit D, 25-Hydroxy 38.0 30.0 - 100.0 ng/mL  Vitamin B12  Result Value Ref Range   Vitamin B-12 729 232 - 1,245 pg/mL  Iron, TIBC and Ferritin Panel  Result Value Ref Range   Total Iron Binding Capacity 333 250 - 450 ug/dL   UIBC 259 118 - 369 ug/dL   Iron 74 27 - 139 ug/dL   Iron Saturation 22 15 - 55 %   Ferritin 30 15 -  150 ng/mL  Microalbumin, Urine Waived  Result Value Ref Range   Microalb, Ur Waived 30 (H) 0 - 19 mg/L   Creatinine, Urine Waived 300 10 - 300 mg/dL   Microalb/Creat Ratio <30 <30 mg/g  Magnesium  Result Value Ref Range   Magnesium 1.8 1.6 - 2.3 mg/dL      Assessment & Plan:   Problem List Items Addressed This  Visit       Cardiovascular and Mediastinum   Essential hypertension    Chronic, ongoing with BP at goal for age today and on occasional home readings at goal.  Recommend she monitor BP at least a few mornings a week at home and document.  DASH diet at home.  Continue current medication regimen and adjust as needed.  Labs today: CMP, CBC, Lipid.  Refills sent in.  Return in 6 months.       Relevant Medications   atorvastatin (LIPITOR) 40 MG tablet   losartan (COZAAR) 100 MG tablet   Other Relevant Orders   Comprehensive metabolic panel (Completed)   Microalbumin, Urine Waived (Completed)     Digestive   Reflux esophagitis    Chronic, ongoing, continues stable on Protonix.  Will check Mag level today and continue regimen.  Consider trial reduction in future, if tolerates would benefit from time off medication.  If poor tolerance, will continue current dose.  Refills sent in.      Relevant Orders   Magnesium     Endocrine   Hypothyroidism    Chronic, ongoing.  Continue current medication regimen as recommended by Dr. Lissa Merlin and collaboration with endocrinology.  Recent notes and labs reviewed.        Nervous and Auditory   Epilepsy ([place]) - Primary    Chronic, stable.  Continue current medication regimen as recommended by neuro and collaboration with them.  CMP today.  Recent neuro note reviewed.        Musculoskeletal and Integument   Osteopenia (Chronic)    Ongoing with DEXA last in 2020 -- repeat in May 2025.  Continue Vit D daily and recommend fall precautions.  Check Vit D level today.        Genitourinary   CKD (chronic kidney disease) stage 3, GFR 30-59 ml/min (HCC) (Chronic)    Chronic, stable.  Avoid NSAIDs.  Continue Losartan for kidney protection.  CMP and urine ALB today.      Relevant Orders   Comprehensive metabolic panel (Completed)   Microalbumin, Urine Waived (Completed)     Other   B12 deficiency    Chronic, ongoing.  Continue daily supplement and  adjust as needed.  Check B12 level today.      Relevant Orders   Vitamin B12 (Completed)   Depression, major, single episode, mild (HCC)    Chronic, ongoing.  Continue current medication regimen, Remeron and Lexapro, and adjust as needed -- currently followed by neurology for this.  Denies SI/HI.  Refills performed by neurology who prescribes.  Return in 6 months for follow-up.      Relevant Medications   escitalopram (LEXAPRO) 10 MG tablet   Hyperlipemia    Chronic, ongoing.  Continue current medication regimen and adjust as needed.  Lipid panel today. Return in 6 months for follow-up.  Refills sent in.      Relevant Medications   atorvastatin (LIPITOR) 40 MG tablet   losartan (COZAAR) 100 MG tablet   Other Relevant Orders   Comprehensive metabolic panel (Completed)   Lipid Panel w/o Chol/HDL  Ratio (Completed)   Insomnia secondary to anxiety    Chronic, ongoing.  Continue current medication regimen, Remeron, and adjust as needed.        Relevant Medications   escitalopram (LEXAPRO) 10 MG tablet   Iron deficiency anemia    Chronic, stable.  Check CBC, ferritin, iron, and B12 today.  Continue daily supplement and adjust plan of care as needed based on labs.      Relevant Orders   CBC with Differential/Platelet (Completed)   Iron, TIBC and Ferritin Panel (Completed)   Microalbuminuria    Continue Losartan for kidney protection, check urine ALB today.      Obesity    BMI 32.06.  Recommended eating smaller high protein, low fat meals more frequently and exercising 30 mins a day 5 times a week with a goal of 10-15lb weight loss in the next 3 months. Patient voiced their understanding and motivation to adhere to these recommendations.       Primary open-angle glaucoma, bilateral, mild stage    Followed by Dr. Gloriann Loan, continue this collaboration and recommend avoiding Flonase products.      Relevant Medications   dorzolamide (TRUSOPT) 2 % ophthalmic solution   Vitamin D  deficiency    Chronic, ongoing.  Continue daily supplement and adjust as needed.  Last DEXA 2020.  Check Vit D level today.      Relevant Orders   VITAMIN D 25 Hydroxy (Vit-D Deficiency, Fractures) (Completed)   Other Visit Diagnoses     Annual physical exam       Annual labs today and health maintenance reviewed with patient -- refuses tetanus and shingrix.        Follow up plan: Return in about 6 months (around 08/06/2021) for HTN/HLD, MOOD, SEIZURES, THYROID, CKD.   LABORATORY TESTING:  - Pap smear: not applicable  IMMUNIZATIONS:   - Tdap: Tetanus vaccination status reviewed: refused. - Influenza: Up to date - Pneumovax: Up to date - Prevnar: Up to date - HPV: Not applicable - Zostavax vaccine: Refuses  SCREENING: -Mammogram: Up to date -- [facility] 06/09/20 - Colonoscopy: Up to date  - Bone Density: Up to date -- [facility] 02/16/2019 -Hearing Test: Not applicable  -Spirometry: Not applicable   PATIENT COUNSELING:   Advised to take 1 mg of folate supplement per day if capable of pregnancy.   Sexuality: Discussed sexually transmitted diseases, partner selection, use of condoms, avoidance of unintended pregnancy  and contraceptive alternatives.   Advised to avoid cigarette smoking.  I discussed with the patient that most people either abstain from alcohol or drink within safe limits (<=14/week and <=4 drinks/occasion for males, <=7/weeks and <= 3 drinks/occasion for females) and that the risk for alcohol disorders and other health effects rises proportionally with the number of drinks per week and how often a drinker exceeds daily limits.  Discussed cessation/primary prevention of drug use and availability of treatment for abuse.   Diet: Encouraged to adjust caloric intake to maintain  or achieve ideal body weight, to reduce intake of dietary saturated fat and total fat, to limit sodium intake by avoiding high sodium foods and not adding table salt, and to maintain adequate dietary  potassium and calcium preferably from fresh fruits, vegetables, and low-fat dairy products.    Stressed the importance of regular exercise  Injury prevention: Discussed safety belts, safety helmets, smoke detector, smoking near bedding or upholstery.   Dental health: Discussed importance of regular tooth brushing, flossing, and dental visits.    NEXT  PREVENTATIVE PHYSICAL DUE IN 1 YEAR. Return in about 6 months (around 08/06/2021) for HTN/HLD, MOOD, SEIZURES, THYROID, CKD.

## 2021-02-06 NOTE — Assessment & Plan Note (Signed)
Chronic, ongoing.  Continue daily supplement and adjust as needed.  Last DEXA 2020.  Check Vit D level today.

## 2021-02-06 NOTE — Assessment & Plan Note (Signed)
Followed by Dr. Bell, continue this collaboration and recommend avoiding Flonase products. 

## 2021-02-06 NOTE — Assessment & Plan Note (Signed)
BMI 32.06.  Recommended eating smaller high protein, low fat meals more frequently and exercising 30 mins a day 5 times a week with a goal of 10-15lb weight loss in the next 3 months. Patient voiced their understanding and motivation to adhere to these recommendations.

## 2021-02-06 NOTE — Assessment & Plan Note (Signed)
Chronic, ongoing, continues stable on Protonix.  Will check Mag level today and continue regimen.  Consider trial reduction in future, if tolerates would benefit from time off medication.  If poor tolerance, will continue current dose.  Refills sent in.

## 2021-02-06 NOTE — Assessment & Plan Note (Addendum)
Chronic, stable.  Avoid NSAIDs.  Continue Losartan for kidney protection.  CMP and urine ALB today.

## 2021-02-06 NOTE — Assessment & Plan Note (Signed)
Chronic, ongoing.  Continue current medication regimen, Remeron, and adjust as needed.   

## 2021-02-07 ENCOUNTER — Other Ambulatory Visit: Payer: Self-pay | Admitting: Nurse Practitioner

## 2021-02-07 DIAGNOSIS — E876 Hypokalemia: Secondary | ICD-10-CM

## 2021-02-07 LAB — COMPREHENSIVE METABOLIC PANEL
ALT: 13 IU/L (ref 0–32)
AST: 20 IU/L (ref 0–40)
Albumin/Globulin Ratio: 1.8 (ref 1.2–2.2)
Albumin: 4.6 g/dL (ref 3.7–4.7)
Alkaline Phosphatase: 98 IU/L (ref 44–121)
BUN/Creatinine Ratio: 18 (ref 12–28)
BUN: 22 mg/dL (ref 8–27)
Bilirubin Total: 0.6 mg/dL (ref 0.0–1.2)
CO2: 26 mmol/L (ref 20–29)
Calcium: 10.2 mg/dL (ref 8.7–10.3)
Chloride: 100 mmol/L (ref 96–106)
Creatinine, Ser: 1.19 mg/dL — ABNORMAL HIGH (ref 0.57–1.00)
Globulin, Total: 2.5 g/dL (ref 1.5–4.5)
Glucose: 101 mg/dL — ABNORMAL HIGH (ref 65–99)
Potassium: 3.4 mmol/L — ABNORMAL LOW (ref 3.5–5.2)
Sodium: 140 mmol/L (ref 134–144)
Total Protein: 7.1 g/dL (ref 6.0–8.5)
eGFR: 49 mL/min/{1.73_m2} — ABNORMAL LOW (ref >59)

## 2021-02-07 LAB — CBC WITH DIFFERENTIAL/PLATELET
Basophils Absolute: 0 10*3/uL (ref 0.0–0.2)
Basos: 1 %
EOS (ABSOLUTE): 0.1 10*3/uL (ref 0.0–0.4)
Eos: 2 %
Hematocrit: 44.9 % (ref 34.0–46.6)
Hemoglobin: 14.7 g/dL (ref 11.1–15.9)
Immature Grans (Abs): 0 10*3/uL (ref 0.0–0.1)
Immature Granulocytes: 0 %
Lymphocytes Absolute: 2 10*3/uL (ref 0.7–3.1)
Lymphs: 37 %
MCH: 30.1 pg (ref 26.6–33.0)
MCHC: 32.7 g/dL (ref 31.5–35.7)
MCV: 92 fL (ref 79–97)
Monocytes Absolute: 0.6 10*3/uL (ref 0.1–0.9)
Monocytes: 11 %
Neutrophils Absolute: 2.7 10*3/uL (ref 1.4–7.0)
Neutrophils: 49 %
Platelets: 293 10*3/uL (ref 150–450)
RBC: 4.89 x10E6/uL (ref 3.77–5.28)
RDW: 13.1 % (ref 11.7–15.4)
WBC: 5.4 10*3/uL (ref 3.4–10.8)

## 2021-02-07 LAB — IRON,TIBC AND FERRITIN PANEL
Ferritin: 30 ng/mL (ref 15–150)
Iron Saturation: 22 % (ref 15–55)
Iron: 74 ug/dL (ref 27–139)
Total Iron Binding Capacity: 333 ug/dL (ref 250–450)
UIBC: 259 ug/dL (ref 118–369)

## 2021-02-07 LAB — VITAMIN B12: Vitamin B-12: 729 pg/mL (ref 232–1245)

## 2021-02-07 LAB — LIPID PANEL W/O CHOL/HDL RATIO
Cholesterol, Total: 186 mg/dL (ref 100–199)
HDL: 79 mg/dL (ref >39)
LDL Chol Calc (NIH): 89 mg/dL (ref 0–99)
Triglycerides: 105 mg/dL (ref 0–149)
VLDL Cholesterol Cal: 18 mg/dL (ref 5–40)

## 2021-02-07 LAB — VITAMIN D 25 HYDROXY (VIT D DEFICIENCY, FRACTURES): Vit D, 25-Hydroxy: 38 ng/mL (ref 30.0–100.0)

## 2021-02-07 LAB — MAGNESIUM: Magnesium: 1.8 mg/dL (ref 1.6–2.3)

## 2021-02-07 NOTE — Progress Notes (Signed)
Contacted via [employer] -- please call to schedule lab visit only for 4 weeks Good evening Rachel Cox, your labs have returned.  Overall they remain stable, including kidney function which continues to show some mild kidney disease that is staying baseline.  The only exception is potassium level is slightly low at 3.4.  I recommend increasing potassium rich foods like bananas, mangoes, avocados, nuts.  I would like you to return in 4 weeks for lab visit only to recheck this.  I will have staff call you to schedule.  Any questions? Keep being stellar!!  Thank you for allowing me to participate in your care.  I appreciate you. Kindest regards, Nura Cahoon

## 2021-02-27 ENCOUNTER — Ambulatory Visit (INDEPENDENT_AMBULATORY_CARE_PROVIDER_SITE_OTHER): Payer: PPO

## 2021-02-27 ENCOUNTER — Telehealth: Payer: PPO | Admitting: General Practice

## 2021-02-27 DIAGNOSIS — E782 Mixed hyperlipidemia: Secondary | ICD-10-CM

## 2021-02-27 DIAGNOSIS — E876 Hypokalemia: Secondary | ICD-10-CM

## 2021-02-27 DIAGNOSIS — I10 Essential (primary) hypertension: Secondary | ICD-10-CM

## 2021-02-27 NOTE — Patient Instructions (Signed)
Visit Information  PATIENT GOALS:  Goals Addressed             This Visit's Progress    RNCM: K level of 3.4 on 02-06-2021       02-27-2021: The patient with slightly low K+ on last office visit on 02-06-2021. The patient will have repeat blood work on 03-09-2021.  The patient states he has been eating a lot of bananas and white beans. Denies any cramps.  Patient Goals: *K+ level to return to WNL *No issues related to arrhythmias or changes in heart rate *No evidence of hypokalemia- fatigue, muscle cramps, changes in hear rhythms *Patient to call the office for any changes, questions, or concerns     RNCM: Track and Manage My Blood Pressure-Hypertension       Timeframe:  Long-Range Goal Priority:  High Start Date:      12-06-2020                       Expected End Date:        12-06-2021               Follow Up Date 05-15-2021    - check blood pressure daily - choose a place to take my blood pressure (home, clinic or office, retail store) - write blood pressure results in a log or diary    Why is this important?   You won't feel high blood pressure, but it can still hurt your blood vessels.  High blood pressure can cause heart or kidney problems. It can also cause a stroke.  Making lifestyle changes like losing a little weight or eating less salt will help.  Checking your blood pressure at home and at different times of the day can help to control blood pressure.  If the doctor prescribes medicine remember to take it the way the doctor ordered.  Call the office if you cannot afford the medicine or if there are questions about it.   BP Readings from Last 3 Encounters:  02/06/21 136/81  10/11/20 (!) 150/78  08/01/20 116/78      Notes: 12-06-2020: The patient has white coat syndrome and her blood pressure is generally high in the provider office but WNL at home. Education and support given. Will continue to monitor. 02-27-2021: The patient is doing well with blood pressures. Denies any new  concerns with blood pressure management . Will continue to monitor.         Patient verbalizes understanding of instructions provided today and agrees to view in [place].   Telephone follow up appointment with care management team member scheduled for: 05-15-2021 at 1 pm  Noreene Larsson RN, MSN, [facility] Mobile: [phone]

## 2021-02-27 NOTE — Chronic Care Management (AMB) (Signed)
Chronic Care Management   CCM RN Visit Note  02/27/2021 Name: Rachel Cox MRN: [id number] DOB: Nov 03, 1950  Subjective: Rachel Cox is a 70 y.o. year old female who is a primary care patient of Cannady, Barbaraann Faster, NP. The care management team was consulted for assistance with disease management and care coordination needs.    Engaged with patient by telephone for follow up visit in response to provider referral for case management and/or care coordination services.   Consent to Services:  The patient was given information about Chronic Care Management services, agreed to services, and gave verbal consent prior to initiation of services.  Please see initial visit note for detailed documentation.   Patient agreed to services and verbal consent obtained.   Assessment: Review of patient past medical history, allergies, medications, health status, including review of consultants reports, laboratory and other test data, was performed as part of comprehensive evaluation and provision of chronic care management services.   SDOH (Social Determinants of Health) assessments and interventions performed:    CCM Care Plan  Allergies  Allergen Reactions   Aspirin Nausea Only   Flonase [Fluticasone Propionate]     Make glaucoma worse   Codeine Palpitations    Pt vocalized    Outpatient Encounter Medications as of 02/27/2021  Medication Sig   atorvastatin (LIPITOR) 40 MG tablet Take 1 tablet (40 mg total) by mouth at bedtime.   Cholecalciferol 25 MCG (1000 UT) tablet Take 1 tablet (1,000 Units total) by mouth daily.   dorzolamide (TRUSOPT) 2 % ophthalmic solution 1 drop 2 (two) times daily.   dorzolamide-timolol (COSOPT) 22.3-6.8 MG/ML ophthalmic solution 1 drop 2 (two) times daily.   escitalopram (LEXAPRO) 10 MG tablet Take 10 mg by mouth daily.   folic acid (FOLVITE) 935 MCG tablet Take 400 mcg by mouth daily.   hydrochlorothiazide (HYDRODIURIL) 25 MG tablet Take 1 tablet (25 mg total) by  mouth daily.   levETIRAcetam (KEPPRA) 250 MG tablet Take 250 mg by mouth at bedtime.    levothyroxine (SYNTHROID) 75 MCG tablet Take 1 tablet (75 mcg total) by mouth daily. Take one tablet every morning   losartan (COZAAR) 100 MG tablet Take 1 tablet (100 mg total) by mouth daily.   mirtazapine (REMERON) 30 MG tablet Take 1 tablet (30 mg total) by mouth every morning.   Multiple Vitamin (MULTIVITAMIN) tablet Take 1 tablet by mouth 3 (three) times a week.    pantoprazole (PROTONIX) 40 MG tablet Take 1 tablet (40 mg total) by mouth daily.   vitamin B-12 (CYANOCOBALAMIN) 500 MCG tablet Take 500 mcg by mouth daily.   No facility-administered encounter medications on file as of 02/27/2021.    Patient Active Problem List   Diagnosis Date Noted   Primary open-angle glaucoma, bilateral, mild stage 08/01/2020   History of scarlet fever 08/02/2019   CKD (chronic kidney disease) stage 3, GFR 30-59 ml/min (HCC) 11/24/2017   Obesity 11/24/2017   Reflux esophagitis 70/17/7939   History of Helicobacter pylori infection 10/14/2017   Iron deficiency anemia 07/28/2017   Vitamin D deficiency 03/02/2015   Microalbuminuria 03/02/2015   Thyroid nodule 03/02/2015   Allergic dermatitis 12/22/2014   Allergic rhinitis    Insomnia secondary to anxiety    Depression, major, single episode, mild ([place])    Epilepsy ([place])    Osteopenia    Hyperlipemia 11/30/2013   Essential hypertension 11/30/2013   Hypothyroidism 11/30/2013   B12 deficiency 11/30/2013    Conditions to be addressed/monitored:HTN, HLD, and Potassium  low  Care Plan : RNCM: Hypertension (Adult)  Updates made by Vanita Ingles, RN since 02/27/2021 12:00 AM     Problem: RNCM: Hypertension (Hypertension)   Priority: Medium     Long-Range Goal: Hypertension Monitored   Start Date: 09/21/2020  Expected End Date: 12/01/2021  This Visit's Progress: On track  Recent Progress: On track  Priority: Medium  Note:   Objective:  Last practice  recorded BP readings:  BP Readings from Last 3 Encounters:  02/06/21 136/81  10/11/20 (!) 150/78  08/01/20 116/78    Most recent eGFR/CrCl:  Lab Results  Component Value Date   EGFR 49 (L) 02/06/2021    No components found for: CRCL Current Barriers:  Knowledge Deficits related to basic understanding of hypertension pathophysiology and self care management Knowledge Deficits related to understanding of medications prescribed for management of hypertension Does not contact provider office for questions/concerns Case Manager Clinical Goal(s):  patient will verbalize understanding of plan for hypertension management patient will attend all scheduled medical appointments: 02-06-2021 patient will demonstrate improved adherence to prescribed treatment plan for hypertension as evidenced by taking all medications as prescribed, monitoring and recording blood pressure as directed, adhering to low sodium/DASH diet patient will demonstrate improved health management independence as evidenced by checking blood pressure as directed and notifying PCP if SBP>160 or DBP > 90, taking all medications as prescribe, and adhering to a low sodium diet as discussed. patient will verbalize basic understanding of hypertension disease process and self health management plan as evidenced by medication compliance, heart healthy diet compliance and working with the CCM team to manage health and well being.  Interventions:  Collaboration with Venita Lick, NP regarding development and update of comprehensive plan of care as evidenced by provider attestation and co-signature Inter-disciplinary care team collaboration (see longitudinal plan of care) Evaluation of current treatment plan related to hypertension self management and patient's adherence to plan as established by provider. 12-06-2020: The patient states that her blood pressures have been good at home but she does have white coat syndrome and her blood pressure  shoots up at the provider office. Education and support given. 02-27-2021: The patient is doing well and denies any acute distress. The patient states she feels good and is continuing with her normal routine.  Provided education to patient re: stroke prevention, s/s of heart attack and stroke, DASH diet, complications of uncontrolled blood pressure Reviewed medications with patient and discussed importance of compliance. 02-27-2021: States compliance with medications  Discussed plans with patient for ongoing care management follow up and provided patient with direct contact information for care management team Advised patient, providing education and rationale, to monitor blood pressure daily and record, calling PCP for findings outside established parameters.  Reviewed scheduled/upcoming provider appointments including: 08-06-2021 at 0800 am Self-Care Activities: - Self administers medications as prescribed Attends all scheduled provider appointments Calls provider office for new concerns, questions, or BP outside discussed parameters Checks BP and records as discussed Follows a low sodium diet/DASH diet Patient Goals: - check blood pressure weekly - choose a place to take my blood pressure (home, clinic or office, retail store) - write blood pressure results in a log or diary - agree on reward when goals are met - agree to work together to make changes - ask questions to understand - have a family meeting to talk about healthy habits - learn about high blood pressure - blood pressure trends reviewed - depression screen reviewed - home or ambulatory blood  pressure monitoring encouraged  Follow Up Plan: Telephone follow up appointment with care management team member scheduled for: 05-15-2021 at 1 pm    Care Plan : RNCM: HLD Management  Updates made by Vanita Ingles, RN since 02/27/2021 12:00 AM     Problem: RNCM: HLD Management   Priority: Medium     Long-Range Goal: RNCM:  Self-Management Plan Developed   Start Date: 09/21/2020  Expected End Date: 10/22/2021  This Visit's Progress: On track  Recent Progress: On track  Priority: Medium  Note:   Current Barriers:  Poorly controlled hyperlipidemia, complicated by history of seizures, HTN Current antihyperlipidemic regimen: Lipitor 40 mg QD Most recent lipid panel:     Component Value Date/Time   CHOL 186 02/06/2021 0931   TRIG 105 02/06/2021 0931   HDL 79 02/06/2021 0931   CHOLHDL 2.6 07/13/2018 1015   VLDL 16 01/06/2017 0928   LDLCALC 89 02/06/2021 0931   LDLCALC 103 (H) 07/13/2018 1015   ASCVD risk enhancing conditions: age >21, HTN, CKD Lacks social connections Does not contact provider office for questions/concerns RN Care Manager Clinical Goal(s):  patient will work with Consulting civil engineer, providers, and care team towards execution of optimized self-health management plan patient will verbalize understanding of plan for effective management of HLD  patient will work with [facility] and pcp  to address needs related to HLD patient will attend all scheduled medical appointments: 08-06-2021 at 0800 am Interventions: Collaboration with Venita Lick, NP regarding development and update of comprehensive plan of care as evidenced by provider attestation and co-signature Inter-disciplinary care team collaboration (see longitudinal plan of care) Medication review performed; medication list updated in electronic medical record.  Inter-disciplinary care team collaboration (see longitudinal plan of care) Referred to pharmacy team for assistance with HLD medication management Evaluation of current treatment plan related to HLD  and patient's adherence to plan as established by provider. 02-27-2021: The patient states that she is doing well. Denies any issues with medications compliance or dietary restrictions. Will continue to monitor for changes.  Advised patient to call the office for questions or concerns Provided  education to patient re: heart healthy diet, medication compliance and working with CCM team to optimize health and well being  Reviewed scheduled/upcoming provider appointments including: 08-06-2021 at 0800 am Discussed plans with patient for ongoing care management follow up and provided patient with direct contact information for care management team Patient Goals/Self-Care Activities: - call for medicine refill 2 or 3 days before it runs out - call if I am sick and can't take my medicine - keep a list of all the medicines I take; vitamins and herbals too - learn to read medicine labels - use a pillbox to sort medicine - use an alarm clock or phone to remind me to take my medicine - change to whole grain breads, cereal, pasta - drink 6 to 8 glasses of water each day - eat 3 to 5 servings of fruits and vegetables each day - eat 5 or 6 small meals each day - fill half the plate with nonstarchy vegetables - limit fast food meals to no more than 1 per week - manage portion size - prepare main meal at home 3 to 5 days each week - be open to making changes - I can manage, know and watch for signs of a heart attack - if I have chest pain, call for help - learn about small changes that will make a big difference - learn  my personal risk factors - barriers to meeting goals identified - change-talk evoked - choices provided - collaboration with team encouraged - decision-making supported - difficulty of making life-long changes acknowledged - health risks reviewed - problem-solving facilitated - questions answered - readiness for change evaluated - reassurance provided - self-reflection promoted - self-reliance encouraged Follow Up Plan: Telephone follow up appointment with care management team member scheduled for: 05-15-2021 at  1 pm       Plan:Telephone follow up appointment with care management team member scheduled for:  05-15-2021 at 1 pm  [place], MSN, [facility] Mobile: [phone]

## 2021-03-02 DIAGNOSIS — E782 Mixed hyperlipidemia: Secondary | ICD-10-CM

## 2021-03-02 DIAGNOSIS — I10 Essential (primary) hypertension: Secondary | ICD-10-CM | POA: Diagnosis not present

## 2021-03-09 ENCOUNTER — Other Ambulatory Visit: Payer: PPO

## 2021-03-09 ENCOUNTER — Other Ambulatory Visit: Payer: Self-pay

## 2021-03-09 DIAGNOSIS — E876 Hypokalemia: Secondary | ICD-10-CM

## 2021-03-10 LAB — BASIC METABOLIC PANEL
BUN/Creatinine Ratio: 12 (ref 12–28)
BUN: 12 mg/dL (ref 8–27)
CO2: 23 mmol/L (ref 20–29)
Calcium: 9.6 mg/dL (ref 8.7–10.3)
Chloride: 104 mmol/L (ref 96–106)
Creatinine, Ser: 1.03 mg/dL — ABNORMAL HIGH (ref 0.57–1.00)
Glucose: 99 mg/dL (ref 70–99)
Potassium: 3.4 mmol/L — ABNORMAL LOW (ref 3.5–5.2)
Sodium: 143 mmol/L (ref 134–144)
eGFR: 58 mL/min/{1.73_m2} — ABNORMAL LOW (ref >59)

## 2021-03-11 ENCOUNTER — Other Ambulatory Visit: Payer: Self-pay | Admitting: Nurse Practitioner

## 2021-03-11 MED ORDER — HYDROCHLOROTHIAZIDE 25 MG PO TABS: 12.5000 mg | ORAL_TABLET | Freq: Every day | ORAL | 4 refills | Status: DC

## 2021-03-11 NOTE — Progress Notes (Signed)
Contacted via [place] -- please call and schedule visit in office for 4 weeks for BP check and labs  Good morning Rachel Cox, your labs have returned.  You continue to show a little mild kidney disease with eGFR 58 and creatinine 1.03 -- but these are slightly better then last visit.  Your potassium remains on lower side at 3.4.  I would like you to cut your Hydrochlorothiazide in 1/2 to 12.5 MG daily as this can lower potassium + continue to add potassium rich foods to diet.  I will have staff call to schedule a follow-up in office in 4 weeks to check BP with medication changes and recheck potassium level.  Any questions? Keep being awesome!!  Thank you for allowing me to participate in your care.  I appreciate you. Kindest regards, Tsutomu Barfoot

## 2021-03-12 DIAGNOSIS — H40153 Residual stage of open-angle glaucoma, bilateral: Secondary | ICD-10-CM | POA: Diagnosis not present

## 2021-03-12 NOTE — Progress Notes (Signed)
Pt is scheduled °

## 2021-03-31 ENCOUNTER — Other Ambulatory Visit: Payer: Self-pay | Admitting: Nurse Practitioner

## 2021-03-31 NOTE — Telephone Encounter (Signed)
last RF 02/06/21 #90 4 RF

## 2021-04-09 ENCOUNTER — Ambulatory Visit: Payer: PPO | Admitting: Nurse Practitioner

## 2021-04-09 ENCOUNTER — Ambulatory Visit: Payer: Self-pay | Admitting: *Deleted

## 2021-04-09 DIAGNOSIS — I10 Essential (primary) hypertension: Secondary | ICD-10-CM

## 2021-04-09 NOTE — Telephone Encounter (Signed)
Reason for Disposition  SEVERE diarrhea (e.g., 7 or more times / day more than normal)  Answer Assessment - Initial Assessment Questions 1. DIARRHEA SEVERITY: "How bad is the diarrhea?" "How many more stools have you had in the past 24 hours than normal?"    - NO DIARRHEA (SCALE 0)   - MILD (SCALE 1-3): Few loose or mushy BMs; increase of 1-3 stools over normal daily number of stools; mild increase in ostomy output.   -  MODERATE (SCALE 4-7): Increase of 4-6 stools daily over normal; moderate increase in ostomy output. * SEVERE (SCALE 8-10; OR 'WORST POSSIBLE'): Increase of 7 or more stools daily over normal; moderate increase in ostomy output; incontinence.     Diarrhea started yesterday morning.   Went 7 times yesterday. 2. ONSET: "When did the diarrhea begin?"      Yesterday morning. Today been 2 1/2 times with diarrhea.   I didn't sleep last night because I was cramping.    I've not taken anything.   My husband talked to our pharmacist and he has gone to get me some Imodium AD now. 3. BM CONSISTENCY: "How loose or watery is the diarrhea?"      It's watery and yellow.   It was brown. 4. VOMITING: "Are you also vomiting?" If Yes, ask: "How many times in the past 24 hours?"      No vomiting or nausea. I had a headache yesterday too.   Today I'm just cramping but no headache.   No fever. 5. ABDOMINAL PAIN: "Are you having any abdominal pain?" If Yes, ask: "What does it feel like?" (e.g., crampy, dull, intermittent, constant)      Yes Cramping when I need to go to the bathroom.   It was severe yesterday but better today.   I'm feeling tired. 6. ABDOMINAL PAIN SEVERITY: If present, ask: "How bad is the pain?"  (e.g., Scale 1-10; mild, moderate, or severe)   - MILD (1-3): doesn't interfere with normal activities, abdomen soft and not tender to touch    - MODERATE (4-7): interferes with normal activities or awakens from sleep, abdomen tender to touch    - SEVERE (8-10): excruciating pain,  doubled over, unable to do any normal activities       2 right now.    At it's worst it's a 5.   It's not that bad but going to the bathroom so much.   7. ORAL INTAKE: If vomiting, "Have you been able to drink liquids?" "How much liquids have you had in the past 24 hours?"     No vomiting 8. HYDRATION: "Any signs of dehydration?" (e.g., dry mouth [not just dry lips], too weak to stand, dizziness, new weight loss) "When did you last urinate?"     I've not eaten much.   I'm drinking water.  No dizziness just feeling tired.   No sore throat or runny nose.     9. EXPOSURE: "Have you traveled to a foreign country recently?" "Have you been exposed to anyone with diarrhea?" "Could you have eaten any food that was spoiled?"     Our pastor had a GI bug.  I was with him last  Sunday and he got sick with a GI bug on Tuesday.   Now I think I have it. 10. ANTIBIOTIC USE: "Are you taking antibiotics now or have you taken antibiotics in the past 2 months?"       No antibiotics 11. OTHER SYMPTOMS: "Do you have any other  symptoms?" (e.g., fever, blood in stool)       No fever, or blood in stool, no runny nose or sore throat. 12. PREGNANCY: "Is there any chance you are pregnant?" "When was your last menstrual period?"       N/A due to age  Protocols used: [facility]

## 2021-04-09 NOTE — Telephone Encounter (Signed)
I returned pt's call.  She had called in earlier because she is having diarrhea that started yesterday morning.   Her husband talked with their pharmacist who recommended she take Imodium AD.   He has gone to [place] now to get it.   She thinks she may have gotten a stomach bug from her pastor who has been sick  this past week but not really sure.  I went over the home care advice with her.   She was agreeable to trying these interventions.   I went over the s/s to call us back for listed in the care advice.  She denies any underlying medical problems like heart, lung or having diabetes when I asked her.  I sent my notes to [facility] for Leward Quan NP information.

## 2021-04-11 NOTE — Telephone Encounter (Signed)
Called and spoke with patient. She states that her symptoms have resolved and she is feeling better. Asked for patient to please give [place] a call back if needed.

## 2021-05-04 ENCOUNTER — Encounter: Payer: Self-pay | Admitting: Nurse Practitioner

## 2021-05-04 ENCOUNTER — Encounter: Payer: PPO | Admitting: Nurse Practitioner

## 2021-05-04 ENCOUNTER — Other Ambulatory Visit: Payer: Self-pay

## 2021-05-04 VITALS — BP 140/78 | HR 97 | Temp 98.2°F | Wt 187.0 lb

## 2021-05-04 DIAGNOSIS — I10 Essential (primary) hypertension: Secondary | ICD-10-CM | POA: Diagnosis not present

## 2021-05-04 LAB — MICROALBUMIN, URINE WAIVED
Creatinine, Urine Waived: 300 mg/dL (ref 10–300)
Microalb, Ur Waived: 150 mg/L — ABNORMAL HIGH (ref 0–19)

## 2021-05-04 NOTE — Patient Instructions (Signed)

## 2021-05-04 NOTE — Progress Notes (Signed)
   BP 140/78   Pulse 97   Temp 98.2 F (36.8 C) (Oral)   Wt 187 lb (84.8 kg)   SpO2 96%   BMI 32.10 kg/m    Subjective:    Patient ID: Rachel Cox, female    DOB: 1951/04/19, 70 y.o.   MRN: [id number]  HPI: Rachel Cox is a 70 y.o. female  Chief Complaint  Patient presents with   Blood Pressure Check   Labs   Medication Management    Patient is here to follow up on Medication Management. Patient states she thinks the medication is working well as she doesn't notice a difference.    Patient left without being seen.  Relevant past medical, surgical, family and social history reviewed and updated as indicated. Interim medical history since our last visit reviewed. Allergies and medications reviewed and updated.  Review of Systems  Per HPI unless specifically indicated above     Objective:    BP 140/78   Pulse 97   Temp 98.2 F (36.8 C) (Oral)   Wt 187 lb (84.8 kg)   SpO2 96%   BMI 32.10 kg/m   Wt Readings from Last 3 Encounters:  05/04/21 187 lb (84.8 kg)  02/06/21 186 lb 12.8 oz (84.7 kg)  10/11/20 183 lb 6.4 oz (83.2 kg)    Physical Exam  Results for orders placed or performed in visit on 59/09/31  Basic metabolic panel  Result Value Ref Range   Glucose 99 70 - 99 mg/dL   BUN 12 8 - 27 mg/dL   Creatinine, Ser 1.03 (H) 0.57 - 1.00 mg/dL   eGFR 58 (L) >59 mL/min/1.73   BUN/Creatinine Ratio 12 12 - 28   Sodium 143 134 - 144 mmol/L   Potassium 3.4 (L) 3.5 - 5.2 mmol/L   Chloride 104 96 - 106 mmol/L   CO2 23 20 - 29 mmol/L   Calcium 9.6 8.7 - 10.3 mg/dL      Assessment & Plan:   Problem List Items Addressed This Visit       Cardiovascular and Mediastinum   Essential hypertension - Primary   Relevant Orders   Basic metabolic panel   Microalbumin, Urine Waived     Follow up plan: No follow-ups on file.

## 2021-05-05 LAB — BASIC METABOLIC PANEL
BUN/Creatinine Ratio: 12 (ref 12–28)
BUN: 14 mg/dL (ref 8–27)
CO2: 24 mmol/L (ref 20–29)
Calcium: 10.3 mg/dL (ref 8.7–10.3)
Chloride: 105 mmol/L (ref 96–106)
Creatinine, Ser: 1.15 mg/dL — ABNORMAL HIGH (ref 0.57–1.00)
Glucose: 104 mg/dL — ABNORMAL HIGH (ref 70–99)
Potassium: 3.6 mmol/L (ref 3.5–5.2)
Sodium: 143 mmol/L (ref 134–144)
eGFR: 51 mL/min/{1.73_m2} — ABNORMAL LOW (ref >59)

## 2021-05-05 NOTE — Progress Notes (Signed)
Contacted via [place] morning Rachel Cox, so sorry I missed you yesterday -- I hear there was some confusion.  Your labs have returned.  Kidney function continues to show mild kidney disease, but this is staying stable.  Potassium level has improved with medication changes made, continue current medication regimen and we will recheck blood pressure next visit.  Please check at home too, our goal is BP <130/90.  Urine continues to show protein passing, we will continue to monitor this.  Any questions? Keep being amazing!!  Thank you for allowing me to participate in your care.  I appreciate you. Kindest regards, Emonnie Azlaan Isidore

## 2021-05-07 DIAGNOSIS — I10 Essential (primary) hypertension: Secondary | ICD-10-CM | POA: Diagnosis not present

## 2021-05-07 DIAGNOSIS — R569 Unspecified convulsions: Secondary | ICD-10-CM | POA: Diagnosis not present

## 2021-05-07 DIAGNOSIS — F419 Anxiety disorder, unspecified: Secondary | ICD-10-CM | POA: Diagnosis not present

## 2021-05-08 ENCOUNTER — Ambulatory Visit (INDEPENDENT_AMBULATORY_CARE_PROVIDER_SITE_OTHER): Payer: PPO | Admitting: Nurse Practitioner

## 2021-05-08 ENCOUNTER — Encounter: Payer: Self-pay | Admitting: Nurse Practitioner

## 2021-05-08 ENCOUNTER — Other Ambulatory Visit: Payer: Self-pay

## 2021-05-08 VITALS — BP 132/77 | HR 72 | Temp 98.3°F | Wt 187.0 lb

## 2021-05-08 DIAGNOSIS — I10 Essential (primary) hypertension: Secondary | ICD-10-CM | POA: Diagnosis not present

## 2021-05-08 DIAGNOSIS — N1831 Chronic kidney disease, stage 3a: Secondary | ICD-10-CM | POA: Diagnosis not present

## 2021-05-08 NOTE — Progress Notes (Signed)
BP 132/77   Pulse 72   Temp 98.3 F (36.8 C)   Wt 187 lb (84.8 kg)   SpO2 95%   BMI 32.10 kg/m    Subjective:    Patient ID: Rachel Cox, female    DOB: April 10, 1951, 70 y.o.   MRN: [id number]  HPI: Rachel Cox is a 70 y.o. female  Chief Complaint  Patient presents with   Hypertension    Patient is here to follow up on Hypertension. Patient denies having any concerns at today's visit.    HYPERTENSION We reduced her HCTZ on 03/09/21 due to lower BP and lower K+ levels on labs.  Repeat K+ normal level.  Continues on Losartan 100 MG daily and HCTZ 12.5 MG daily.    Hypertension status: stable  Satisfied with current treatment? yes Duration of hypertension: chronic BP monitoring frequency:  not checking BP range: 123/84 yesterday BP medication side effects:  no Medication compliance: good compliance Previous BP meds: HCTZ and Lisinopril Aspirin: no Recurrent headaches: no Visual changes: no Palpitations: no Dyspnea: no Chest pain: no Lower extremity edema: no Dizzy/lightheaded: no   CHRONIC KIDNEY DISEASE CKD status: stable Medications renally dose: yes Previous renal evaluation: no Pneumovax:  Up to Date Influenza Vaccine:  Up to Date   Relevant past medical, surgical, family and social history reviewed and updated as indicated. Interim medical history since our last visit reviewed. Allergies and medications reviewed and updated.  Review of Systems  Constitutional:  Negative for activity change, appetite change, diaphoresis, fatigue and fever.  Respiratory:  Negative for cough, chest tightness, shortness of breath and wheezing.   Cardiovascular:  Negative for chest pain, palpitations and leg swelling.  Gastrointestinal: Negative.   Endocrine: Negative for cold intolerance and heat intolerance.  Neurological: Negative.   Psychiatric/Behavioral: Negative.     Per HPI unless specifically indicated above     Objective:    BP 132/77   Pulse 72   Temp 98.3  F (36.8 C)   Wt 187 lb (84.8 kg)   SpO2 95%   BMI 32.10 kg/m   Wt Readings from Last 3 Encounters:  05/08/21 187 lb (84.8 kg)  05/04/21 187 lb (84.8 kg)  02/06/21 186 lb 12.8 oz (84.7 kg)    Physical Exam Vitals and nursing note reviewed.  Constitutional:      General: She is awake. She is not in acute distress.    Appearance: She is well-developed and overweight. She is not ill-appearing.  HENT:     Head: Normocephalic.     Right Ear: Hearing normal.     Left Ear: Hearing normal.  Eyes:     General: Lids are normal.        Right eye: No discharge.        Left eye: No discharge.     Conjunctiva/sclera: Conjunctivae normal.     Pupils: Pupils are equal, round, and reactive to light.  Neck:     Thyroid: No thyromegaly.     Vascular: No carotid bruit.  Cardiovascular:     Rate and Rhythm: Normal rate and regular rhythm.     Heart sounds: Normal heart sounds. No murmur heard.   No gallop.  Pulmonary:     Effort: Pulmonary effort is normal. No accessory muscle usage or respiratory distress.     Breath sounds: Normal breath sounds.  Abdominal:     General: Bowel sounds are normal.     Palpations: Abdomen is soft. There is no hepatomegaly  or splenomegaly.  Musculoskeletal:     Cervical back: Normal range of motion and neck supple.     Right lower leg: No edema.     Left lower leg: No edema.  Skin:    General: Skin is warm and dry.  Neurological:     Mental Status: She is alert and oriented to person, place, and time.  Psychiatric:        Attention and Perception: Attention normal.        Mood and Affect: Mood normal.        Speech: Speech normal.        Behavior: Behavior normal. Behavior is cooperative.    Results for orders placed or performed in visit on 16/10/96  Basic metabolic panel  Result Value Ref Range   Glucose 104 (H) 70 - 99 mg/dL   BUN 14 8 - 27 mg/dL   Creatinine, Ser 1.15 (H) 0.57 - 1.00 mg/dL   eGFR 51 (L) >59 mL/min/1.73   BUN/Creatinine Ratio  12 12 - 28   Sodium 143 134 - 144 mmol/L   Potassium 3.6 3.5 - 5.2 mmol/L   Chloride 105 96 - 106 mmol/L   CO2 24 20 - 29 mmol/L   Calcium 10.3 8.7 - 10.3 mg/dL  Microalbumin, Urine Waived  Result Value Ref Range   Microalb, Ur Waived 150 (H) 0 - 19 mg/L   Creatinine, Urine Waived 300 10 - 300 mg/dL   Microalb/Creat Ratio 30-300 (H) <30 mg/g      Assessment & Plan:   Problem List Items Addressed This Visit       Cardiovascular and Mediastinum   Essential hypertension    Chronic, ongoing with BP at goal for age today and on occasional home readings at goal.  Recommend she monitor BP at least a few mornings a week at home and document.  DASH diet at home.  Continue current medication regimen and adjust as needed.  Labs up to date.  Return in 6 months.         Genitourinary   CKD (chronic kidney disease) stage 3, GFR 30-59 ml/min (HCC) - Primary (Chronic)    Chronic, stable.  Avoid NSAIDs.  Continue Losartan for kidney protection.  Labs up to date.  Consider nephrology referral if any decline.        Follow up plan: Return for as scheduled in March.

## 2021-05-08 NOTE — Assessment & Plan Note (Signed)
Chronic, stable.  Avoid NSAIDs.  Continue Losartan for kidney protection.  Labs up to date.  Consider nephrology referral if any decline.

## 2021-05-08 NOTE — Patient Instructions (Signed)

## 2021-05-08 NOTE — Assessment & Plan Note (Signed)
Chronic, ongoing with BP at goal for age today and on occasional home readings at goal.  Recommend she monitor BP at least a few mornings a week at home and document.  DASH diet at home.  Continue current medication regimen and adjust as needed.  Labs up to date.  Return in 6 months.

## 2021-05-15 ENCOUNTER — Telehealth: Payer: PPO | Admitting: General Practice

## 2021-05-15 ENCOUNTER — Ambulatory Visit: Payer: Self-pay

## 2021-05-15 DIAGNOSIS — E782 Mixed hyperlipidemia: Secondary | ICD-10-CM

## 2021-05-15 DIAGNOSIS — I10 Essential (primary) hypertension: Secondary | ICD-10-CM

## 2021-05-15 NOTE — Chronic Care Management (AMB) (Signed)
Chronic Care Management   CCM RN Visit Note  05/15/2021 Name: Rachel Cox MRN: [id number] DOB: 1951-03-27  Subjective: Rachel Cox is a 70 y.o. year old female who is a primary care patient of Cannady, Barbaraann Faster, NP. The care management team was consulted for assistance with disease management and care coordination needs.    Engaged with patient by telephone for follow up visit in response to provider referral for case management and/or care coordination services.   Consent to Services:  The patient was given information about Chronic Care Management services, agreed to services, and gave verbal consent prior to initiation of services.  Please see initial visit note for detailed documentation.   Patient agreed to services and verbal consent obtained.   Assessment: Review of patient past medical history, allergies, medications, health status, including review of consultants reports, laboratory and other test data, was performed as part of comprehensive evaluation and provision of chronic care management services.   SDOH (Social Determinants of Health) assessments and interventions performed:    CCM Care Plan  Allergies  Allergen Reactions   Aspirin Nausea Only   Flonase [Fluticasone Propionate]     Make glaucoma worse   Codeine Palpitations    Pt vocalized    Outpatient Encounter Medications as of 05/15/2021  Medication Sig   atorvastatin (LIPITOR) 40 MG tablet Take 1 tablet (40 mg total) by mouth at bedtime.   Cholecalciferol 25 MCG (1000 UT) tablet Take 1 tablet (1,000 Units total) by mouth daily.   dorzolamide (TRUSOPT) 2 % ophthalmic solution 1 drop 2 (two) times daily.   dorzolamide-timolol (COSOPT) 22.3-6.8 MG/ML ophthalmic solution 1 drop 2 (two) times daily.   escitalopram (LEXAPRO) 10 MG tablet Take 10 mg by mouth daily.   folic acid (FOLVITE) 300 MCG tablet Take 400 mcg by mouth daily.   hydrochlorothiazide (HYDRODIURIL) 25 MG tablet Take 0.5 tablets (12.5 mg  total) by mouth daily.   levETIRAcetam (KEPPRA) 250 MG tablet Take 250 mg by mouth at bedtime.    levETIRAcetam (KEPPRA) 250 MG tablet Take by mouth.   levothyroxine (SYNTHROID) 75 MCG tablet Take 1 tablet (75 mcg total) by mouth daily. Take one tablet every morning   losartan (COZAAR) 100 MG tablet Take 1 tablet (100 mg total) by mouth daily.   mirtazapine (REMERON) 30 MG tablet Take 1 tablet (30 mg total) by mouth every morning.   mirtazapine (REMERON) 30 MG tablet Take by mouth.   Multiple Vitamin (MULTIVITAMIN) tablet Take 1 tablet by mouth 3 (three) times a week.    pantoprazole (PROTONIX) 40 MG tablet Take 1 tablet (40 mg total) by mouth daily.   vitamin B-12 (CYANOCOBALAMIN) 500 MCG tablet Take 500 mcg by mouth daily.   No facility-administered encounter medications on file as of 05/15/2021.    Patient Active Problem List   Diagnosis Date Noted   Primary open-angle glaucoma, bilateral, mild stage 08/01/2020   History of scarlet fever 08/02/2019   CKD (chronic kidney disease) stage 3, GFR 30-59 ml/min (HCC) 11/24/2017   Obesity 11/24/2017   Reflux esophagitis 92/33/0076   History of Helicobacter pylori infection 10/14/2017   Iron deficiency anemia 07/28/2017   Vitamin D deficiency 03/02/2015   Microalbuminuria 03/02/2015   Thyroid nodule 03/02/2015   Allergic dermatitis 12/22/2014   Allergic rhinitis    Insomnia secondary to anxiety    Depression, major, single episode, mild ([place])    Epilepsy ([place])    Osteopenia    Hyperlipemia 11/30/2013   Essential hypertension  11/30/2013   Hypothyroidism 11/30/2013   B12 deficiency 11/30/2013    Conditions to be addressed/monitored:HTN and HLD  Care Plan : RNCM: Hypertension (Adult)  Updates made by Vanita Ingles, RN since 05/15/2021 12:00 AM     Problem: RNCM: Hypertension (Hypertension)   Priority: Medium     Long-Range Goal: Hypertension Monitored   Start Date: 09/21/2020  Expected End Date: 12/01/2021  This Visit's  Progress: On track  Recent Progress: On track  Priority: Medium  Note:   Objective:  Last practice recorded BP readings:  BP Readings from Last 3 Encounters:  05/08/21 132/77  05/04/21 140/78  02/06/21 136/81    Most recent eGFR/CrCl:  Lab Results  Component Value Date   EGFR 51 (L) 05/04/2021    No components found for: CRCL Current Barriers:  Knowledge Deficits related to basic understanding of hypertension pathophysiology and self care management Knowledge Deficits related to understanding of medications prescribed for management of hypertension Does not contact provider office for questions/concerns Case Manager Clinical Goal(s):  patient will verbalize understanding of plan for hypertension management patient will attend all scheduled medical appointments: 08-06-2021 patient will demonstrate improved adherence to prescribed treatment plan for hypertension as evidenced by taking all medications as prescribed, monitoring and recording blood pressure as directed, adhering to low sodium/DASH diet patient will demonstrate improved health management independence as evidenced by checking blood pressure as directed and notifying PCP if SBP>160 or DBP > 90, taking all medications as prescribe, and adhering to a low sodium diet as discussed. patient will verbalize basic understanding of hypertension disease process and self health management plan as evidenced by medication compliance, heart healthy diet compliance and working with the CCM team to manage health and well being.  Interventions:  Collaboration with Venita Lick, NP regarding development and update of comprehensive plan of care as evidenced by provider attestation and co-signature Inter-disciplinary care team collaboration (see longitudinal plan of care) Evaluation of current treatment plan related to hypertension self management and patient's adherence to plan as established by provider. 12-06-2020: The patient states that her  blood pressures have been good at home but she does have white coat syndrome and her blood pressure shoots up at the provider office. Education and support given. 02-27-2021: The patient is doing well and denies any acute distress. The patient states she feels good and is continuing with her normal routine. 05-15-2021: The patient is doing well with blood pressure management. Currently has a head cold but is taking OTC products. Knows to call the office for changes or new concerns. Will continue to monitor.  Provided education to patient re: stroke prevention, s/s of heart attack and stroke, DASH diet, complications of uncontrolled blood pressure Reviewed medications with patient and discussed importance of compliance. 05-15-2021: States compliance with medications  Discussed plans with patient for ongoing care management follow up and provided patient with direct contact information for care management team Advised patient, providing education and rationale, to monitor blood pressure daily and record, calling PCP for findings outside established parameters.  Reviewed scheduled/upcoming provider appointments including: 08-06-2021 at 0800 am Self-Care Activities: - Self administers medications as prescribed Attends all scheduled provider appointments Calls provider office for new concerns, questions, or BP outside discussed parameters Checks BP and records as discussed Follows a low sodium diet/DASH diet Patient Goals: - check blood pressure weekly - choose a place to take my blood pressure (home, clinic or office, retail store) - write blood pressure results in a log or diary -  agree on reward when goals are met - agree to work together to make changes - ask questions to understand - have a family meeting to talk about healthy habits - learn about high blood pressure - blood pressure trends reviewed - depression screen reviewed - home or ambulatory blood pressure monitoring encouraged      Care Plan : RNCM: HLD Management  Updates made by Vanita Ingles, RN since 05/15/2021 12:00 AM     Problem: RNCM: HLD Management   Priority: Medium     Long-Range Goal: RNCM: Self-Management Plan Developed   Start Date: 09/21/2020  Expected End Date: 10/22/2021  This Visit's Progress: On track  Recent Progress: On track  Priority: Medium  Note:   Current Barriers:  Poorly controlled hyperlipidemia, complicated by history of seizures, HTN Current antihyperlipidemic regimen: Lipitor 40 mg QD Most recent lipid panel:     Component Value Date/Time   CHOL 186 02/06/2021 0931   TRIG 105 02/06/2021 0931   HDL 79 02/06/2021 0931   CHOLHDL 2.6 07/13/2018 1015   VLDL 16 01/06/2017 0928   LDLCALC 89 02/06/2021 0931   LDLCALC 103 (H) 07/13/2018 1015   ASCVD risk enhancing conditions: age >62, HTN, CKD Lacks social connections Does not contact provider office for questions/concerns RN Care Manager Clinical Goal(s):  patient will work with Consulting civil engineer, providers, and care team towards execution of optimized self-health management plan patient will verbalize understanding of plan for effective management of HLD  patient will work with [facility] and pcp  to address needs related to HLD patient will attend all scheduled medical appointments: 08-06-2021 at 0800 am Interventions: Collaboration with Venita Lick, NP regarding development and update of comprehensive plan of care as evidenced by provider attestation and co-signature Inter-disciplinary care team collaboration (see longitudinal plan of care) Medication review performed; medication list updated in electronic medical record.  Inter-disciplinary care team collaboration (see longitudinal plan of care) Referred to pharmacy team for assistance with HLD medication management Evaluation of current treatment plan related to HLD  and patient's adherence to plan as established by provider. 05-15-2021: The patient states that she is doing  well. Denies any issues with medications compliance or dietary restrictions. Will continue to monitor for changes.  Advised patient to call the office for questions or concerns Provided education to patient re: heart healthy diet, medication compliance and working with CCM team to optimize health and well being  Reviewed scheduled/upcoming provider appointments including: 08-06-2021 at 0800 am Discussed plans with patient for ongoing care management follow up and provided patient with direct contact information for care management team Patient Goals/Self-Care Activities: - call for medicine refill 2 or 3 days before it runs out - call if I am sick and can't take my medicine - keep a list of all the medicines I take; vitamins and herbals too - learn to read medicine labels - use a pillbox to sort medicine - use an alarm clock or phone to remind me to take my medicine - change to whole grain breads, cereal, pasta - drink 6 to 8 glasses of water each day - eat 3 to 5 servings of fruits and vegetables each day - eat 5 or 6 small meals each day - fill half the plate with nonstarchy vegetables - limit fast food meals to no more than 1 per week - manage portion size - prepare main meal at home 3 to 5 days each week - be open to making changes - I can  manage, know and watch for signs of a heart attack - if I have chest pain, call for help - learn about small changes that will make a big difference - learn my personal risk factors - barriers to meeting goals identified - change-talk evoked - choices provided - collaboration with team encouraged - decision-making supported - difficulty of making life-long changes acknowledged - health risks reviewed - problem-solving facilitated - questions answered - readiness for change evaluated - reassurance provided - self-reflection promoted - self-reliance encouraged       Plan:Telephone follow up appointment with care management team member  scheduled for:  07-31-2021 at 1 pm  [place], MSN, [facility] Mobile: [phone]

## 2021-05-15 NOTE — Patient Instructions (Signed)
Visit Information  Thank you for taking time to visit with me today. Please don't hesitate to contact me if I can be of assistance to you before our next scheduled telephone appointment.  Following are the goals we discussed today:  Case Manager Clinical Goal(s):  patient will verbalize understanding of plan for hypertension management patient will attend all scheduled medical appointments: 08-06-2021 patient will demonstrate improved adherence to prescribed treatment plan for hypertension as evidenced by taking all medications as prescribed, monitoring and recording blood pressure as directed, adhering to low sodium/DASH diet patient will demonstrate improved health management independence as evidenced by checking blood pressure as directed and notifying PCP if SBP>160 or DBP > 90, taking all medications as prescribe, and adhering to a low sodium diet as discussed. patient will verbalize basic understanding of hypertension disease process and self health management plan as evidenced by medication compliance, heart healthy diet compliance and working with the CCM team to manage health and well being.  Interventions:  Collaboration with Venita Lick, NP regarding development and update of comprehensive plan of care as evidenced by provider attestation and co-signature Inter-disciplinary care team collaboration (see longitudinal plan of care) Evaluation of current treatment plan related to hypertension self management and patient's adherence to plan as established by provider. 12-06-2020: The patient states that her blood pressures have been good at home but she does have white coat syndrome and her blood pressure shoots up at the provider office. Education and support given. 02-27-2021: The patient is doing well and denies any acute distress. The patient states she feels good and is continuing with her normal routine. 05-15-2021: The patient is doing well with blood pressure management. Currently has a  head cold but is taking OTC products. Knows to call the office for changes or new concerns. Will continue to monitor.  Provided education to patient re: stroke prevention, s/s of heart attack and stroke, DASH diet, complications of uncontrolled blood pressure Reviewed medications with patient and discussed importance of compliance. 05-15-2021: States compliance with medications  Discussed plans with patient for ongoing care management follow up and provided patient with direct contact information for care management team Advised patient, providing education and rationale, to monitor blood pressure daily and record, calling PCP for findings outside established parameters.  Reviewed scheduled/upcoming provider appointments including: 08-06-2021 at 0800 am Self-Care Activities: - Self administers medications as prescribed Attends all scheduled provider appointments Calls provider office for new concerns, questions, or BP outside discussed parameters Checks BP and records as discussed Follows a low sodium diet/DASH diet Patient Goals: - check blood pressure weekly - choose a place to take my blood pressure (home, clinic or office, retail store) - write blood pressure results in a log or diary - agree on reward when goals are met - agree to work together to make changes - ask questions to understand - have a family meeting to talk about healthy habits - learn about high blood pressure - blood pressure trends reviewed - depression screen reviewed - home or ambulatory blood pressure monitoring encouraged        Care Plan : RNCM: HLD Management  Updates made by Vanita Ingles, RN since 05/15/2021 12:00 AM       Problem: RNCM: HLD Management    Priority: Medium       Long-Range Goal: RNCM: Self-Management Plan Developed    Start Date: 09/21/2020  Expected End Date: 10/22/2021  This Visit's Progress: On track  Recent Progress: On track  Priority:  Medium  Note:   Current Barriers:  Poorly  controlled hyperlipidemia, complicated by history of seizures, HTN Current antihyperlipidemic regimen: Lipitor 40 mg QD Most recent lipid panel:          Component Value Date/Time    CHOL 186 02/06/2021 0931    TRIG 105 02/06/2021 0931    HDL 79 02/06/2021 0931    CHOLHDL 2.6 07/13/2018 1015    VLDL 16 01/06/2017 0928    LDLCALC 89 02/06/2021 0931    LDLCALC 103 (H) 07/13/2018 1015    ASCVD risk enhancing conditions: age >78, HTN, CKD Lacks social connections Does not contact provider office for questions/concerns RN Care Manager Clinical Goal(s):  patient will work with Consulting civil engineer, providers, and care team towards execution of optimized self-health management plan patient will verbalize understanding of plan for effective management of HLD  patient will work with [facility] and pcp  to address needs related to HLD patient will attend all scheduled medical appointments: 08-06-2021 at 0800 am Interventions: Collaboration with Venita Lick, NP regarding development and update of comprehensive plan of care as evidenced by provider attestation and co-signature Inter-disciplinary care team collaboration (see longitudinal plan of care) Medication review performed; medication list updated in electronic medical record.  Inter-disciplinary care team collaboration (see longitudinal plan of care) Referred to pharmacy team for assistance with HLD medication management Evaluation of current treatment plan related to HLD  and patient's adherence to plan as established by provider. 05-15-2021: The patient states that she is doing well. Denies any issues with medications compliance or dietary restrictions. Will continue to monitor for changes.  Advised patient to call the office for questions or concerns Provided education to patient re: heart healthy diet, medication compliance and working with CCM team to optimize health and well being  Reviewed scheduled/upcoming provider appointments including:  08-06-2021 at 0800 am Discussed plans with patient for ongoing care management follow up and provided patient with direct contact information for care management team Patient Goals/Self-Care Activities: - call for medicine refill 2 or 3 days before it runs out - call if I am sick and can't take my medicine - keep a list of all the medicines I take; vitamins and herbals too - learn to read medicine labels - use a pillbox to sort medicine - use an alarm clock or phone to remind me to take my medicine - change to whole grain breads, cereal, pasta - drink 6 to 8 glasses of water each day - eat 3 to 5 servings of fruits and vegetables each day - eat 5 or 6 small meals each day - fill half the plate with nonstarchy vegetables - limit fast food meals to no more than 1 per week - manage portion size - prepare main meal at home 3 to 5 days each week - be open to making changes - I can manage, know and watch for signs of a heart attack - if I have chest pain, call for help - learn about small changes that will make a big difference - learn my personal risk factors - barriers to meeting goals identified - change-talk evoked - choices provided - collaboration with team encouraged - decision-making supported - difficulty of making life-long changes acknowledged - health risks reviewed - problem-solving facilitated - questions answered - readiness for change evaluated - reassurance provided - self-reflection promoted - self-reliance encouraged          Our next appointment is by telephone on 07-31-2021 at 1 pm  Please call the care guide team at [phone] if you need to cancel or reschedule your appointment.   If you are experiencing a Mental Health or [place] or need someone to talk to, please call the Suicide and Crisis Lifeline: 988 call the [place] National Suicide Prevention Lifeline: 978-017-3416 or TTY: 716-619-8458 TTY 509-525-7686) to talk to a trained  counselor call 1-800-273-TALK (toll free, 24 hour hotline)   Patient verbalizes understanding of instructions provided today and agrees to view in [place].   Noreene Larsson RN, MSN, [facility] Mobile: [phone]

## 2021-07-09 ENCOUNTER — Ambulatory Visit (INDEPENDENT_AMBULATORY_CARE_PROVIDER_SITE_OTHER): Payer: PPO | Admitting: *Deleted

## 2021-07-09 DIAGNOSIS — Z780 Asymptomatic menopausal state: Secondary | ICD-10-CM

## 2021-07-09 DIAGNOSIS — Z0000 Encounter for general adult medical examination without abnormal findings: Secondary | ICD-10-CM | POA: Diagnosis not present

## 2021-07-09 NOTE — Patient Instructions (Signed)
[name] , Thank you for taking time to come for your Medicare Wellness Visit. I appreciate your ongoing commitment to your health goals. Please review the following plan we discussed and let me know if I can assist you in the future.   Screening recommendations/referrals: Colonoscopy: up to date Mammogram: up to date Bone Density: up to date Recommended yearly ophthalmology/optometry visit for glaucoma screening and checkup Recommended yearly dental visit for hygiene and checkup  Vaccinations: Influenza vaccine: up to date Pneumococcal vaccine: up to date Tdap vaccine: Education provided Shingles vaccine: Education provided    Advanced directives: Education provided  Conditions/risks identified:   Next appointment: 08-06-2021 @ 8:00 [place]   Preventive Care 73 Years and Older, Female Preventive care refers to lifestyle choices and visits with your health care provider that can promote health and wellness. What does preventive care include? A yearly physical exam. This is also called an annual well check. Dental exams once or twice a year. Routine eye exams. Ask your health care provider how often you should have your eyes checked. Personal lifestyle choices, including: Daily care of your teeth and gums. Regular physical activity. Eating a healthy diet. Avoiding to---b-acco and drug use. Limiting alcohol use. Practicing safe sex. Taking low-dose aspirin every day. Taking vitamin and mineral supplements as recommended by your health care provider. What happens during an annual well check? The services and screenings done by your health care provider during your annual well check will depend on your age, overall health, lifestyle risk factors, and family history of disease. Counseling  Your health care provider may ask you questions about your: Alcohol use. Tobacco use. Drug use. Emotional well-being. Home and relationship well-being. Sexual activity. Eating  habits. History of falls. Memory and ability to understand (cognition). Work and work Statistician. Reproductive health. Screening  You may have the following tests or measurements: Height, weight, and BMI. Blood pressure. Lipid and cholesterol levels. These may be checked every 5 years, or more frequently if you are over 73 years old. Skin check. Lung cancer screening. You may have this screening every year starting at age 73 if you have a 30-pack-year history of smoking and currently smoke or have quit within the past 15 years. Fecal occult blood test (FOBT) of the stool. You may have this test every year starting at age 73. Flexible sigmoidoscopy or colonoscopy. You may have a sigmoidoscopy every 5 years or a colonoscopy every 10 years starting at age 73. Hepatitis C blood test. Hepatitis B blood test. Sexually transmitted disease (STD) testing. Diabetes screening. This is done by checking your blood sugar (glucose) after you have not eaten for a while (fasting). You may have this done every 1-3 years. Bone density scan. This is done to screen for osteoporosis. You may have this done starting at age 73. Mammogram. This may be done every 1-2 years. Talk to your health care provider about how often you should have regular mammograms. Talk with your health care provider about your test results, treatment options, and if necessary, the need for more tests. Vaccines  Your health care provider may recommend certain vaccines, such as: Influenza vaccine. This is recommended every year. Tetanus, diphtheria, and acellular pertussis (Tdap, Td) vaccine. You may need a Td booster every 10 years. Zoster vaccine. You may need this after age 73. Pneumococcal 13-valent conjugate (PCV13) vaccine. One dose is recommended after age 73. Pneumococcal polysaccharide (PPSV23) vaccine. One dose is recommended after age 73. Talk to your health care provider about  which screenings and vaccines you need and how  often you need them. This information is not intended to replace advice given to you by your health care provider. Make sure you discuss any questions you have with your health care provider. Document Released: 06/16/2015 Document Revised: 02/07/2016 Document Reviewed: 03/21/2015 Elsevier Interactive Patient Education  2017 [place] Prevention in the Home Falls can cause injuries. They can happen to people of all ages. There are many things you can do to make your home safe and to help prevent falls. What can I do on the outside of my home? Regularly fix the edges of walkways and driveways and fix any cracks. Remove anything that might make you trip as you walk through a door, such as a raised step or threshold. Trim any bushes or trees on the path to your home. Use bright outdoor lighting. Clear any walking paths of anything that might make someone trip, such as rocks or tools. Regularly check to see if handrails are loose or broken. Make sure that both sides of any steps have handrails. Any raised decks and porches should have guardrails on the edges. Have any leaves, snow, or ice cleared regularly. Use sand or salt on walking paths during winter. Clean up any spills in your garage right away. This includes oil or grease spills. What can I do in the bathroom? Use night lights. Install grab bars by the toilet and in the tub and shower. Do not use towel bars as grab bars. Use non-skid mats or decals in the tub or shower. If you need to sit down in the shower, use a plastic, non-slip stool. Keep the floor dry. Clean up any water that spills on the floor as soon as it happens. Remove soap buildup in the tub or shower regularly. Attach bath mats securely with double-sided non-slip rug tape. Do not have throw rugs and other things on the floor that can make you trip. What can I do in the bedroom? Use night lights. Make sure that you have a light by your bed that is easy to  reach. Do not use any sheets or blankets that are too big for your bed. They should not hang down onto the floor. Have a firm chair that has side arms. You can use this for support while you get dressed. Do not have throw rugs and other things on the floor that can make you trip. What can I do in the kitchen? Clean up any spills right away. Avoid walking on wet floors. Keep items that you use a lot in easy-to-reach places. If you need to reach something above you, use a strong step stool that has a grab bar. Keep electrical cords out of the way. Do not use floor polish or wax that makes floors slippery. If you must use wax, use non-skid floor wax. Do not have throw rugs and other things on the floor that can make you trip. What can I do with my stairs? Do not leave any items on the stairs. Make sure that there are handrails on both sides of the stairs and use them. Fix handrails that are broken or loose. Make sure that handrails are as long as the stairways. Check any carpeting to make sure that it is firmly attached to the stairs. Fix any carpet that is loose or worn. Avoid having throw rugs at the top or bottom of the stairs. If you do have throw rugs, attach them to the floor with  carpet tape. Make sure that you have a light switch at the top of the stairs and the bottom of the stairs. If you do not have them, ask someone to add them for you. What else can I do to help prevent falls? Wear shoes that: Do not have high heels. Have rubber bottoms. Are comfortable and fit you well. Are closed at the toe. Do not wear sandals. If you use a stepladder: Make sure that it is fully opened. Do not climb a closed stepladder. Make sure that both sides of the stepladder are locked into place. Ask someone to hold it for you, if possible. Clearly mark and make sure that you can see: Any grab bars or handrails. First and last steps. Where the edge of each step is. Use tools that help you move  around (mobility aids) if they are needed. These include: Canes. Walkers. Scooters. Crutches. Turn on the lights when you go into a dark area. Replace any light bulbs as soon as they burn out. Set up your furniture so you have a clear path. Avoid moving your furniture around. If any of your floors are uneven, fix them. If there are any pets around you, be aware of where they are. Review your medicines with your doctor. Some medicines can make you feel dizzy. This can increase your chance of falling. Ask your doctor what other things that you can do to help prevent falls. This information is not intended to replace advice given to you by your health care provider. Make sure you discuss any questions you have with your health care provider. Document Released: 03/16/2009 Document Revised: 10/26/2015 Document Reviewed: 06/24/2014 Elsevier Interactive Patient Education  2017 [employer].

## 2021-07-09 NOTE — Progress Notes (Signed)
Subjective:   Rachel Cox is a 71 y.o. female who presents for Medicare Annual (Subsequent) preventive examination.  I connected with  Hurley Cisco on 07/09/21 by a telephone enabled telemedicine application and verified that I am speaking with the correct person using two identifiers.   I discussed the limitations of evaluation and management by telemedicine. The patient expressed understanding and agreed to proceed.  Patient location: home  Provider location:  Tele-Health not in office    Review of Systems     Cardiac Risk Factors include: hypertension;obesity (BMI >30kg/m2);advanced age (>54men, >22 women)     Objective:    Today's Vitals   There is no height or weight on file to calculate BMI.  Advanced Directives 07/09/2021 07/07/2020 02/01/2019 12/12/2017 12/05/2017 11/14/2017 10/29/2017  Does Patient Have a Medical Advance Directive? No No No No No No No  Would patient like information on creating a medical advance directive? No - Patient declined - Yes (MAU/Ambulatory/Procedural Areas - Information given) No - Patient declined - Yes (MAU/Ambulatory/Procedural Areas - Information given) -  Pre-existing out of facility DNR order (yellow form or pink MOST form) - - - - - - Physician notified to receive inpatient order    Current Medications (verified) Outpatient Encounter Medications as of 07/09/2021  Medication Sig   atorvastatin (LIPITOR) 40 MG tablet Take 1 tablet (40 mg total) by mouth at bedtime.   Cholecalciferol 25 MCG (1000 UT) tablet Take 1 tablet (1,000 Units total) by mouth daily.   dorzolamide (TRUSOPT) 2 % ophthalmic solution 1 drop 2 (two) times daily.   dorzolamide-timolol (COSOPT) 22.3-6.8 MG/ML ophthalmic solution 1 drop 2 (two) times daily.   escitalopram (LEXAPRO) 10 MG tablet Take 10 mg by mouth daily.   folic acid (FOLVITE) 366 MCG tablet Take 400 mcg by mouth daily.   hydrochlorothiazide (HYDRODIURIL) 25 MG tablet Take 0.5 tablets (12.5 mg total) by mouth  daily.   levETIRAcetam (KEPPRA) 250 MG tablet Take 250 mg by mouth at bedtime.    levETIRAcetam (KEPPRA) 250 MG tablet Take by mouth.   levothyroxine (SYNTHROID) 75 MCG tablet Take 1 tablet (75 mcg total) by mouth daily. Take one tablet every morning   losartan (COZAAR) 100 MG tablet Take 1 tablet (100 mg total) by mouth daily.   mirtazapine (REMERON) 30 MG tablet Take 1 tablet (30 mg total) by mouth every morning.   mirtazapine (REMERON) 30 MG tablet Take by mouth.   Multiple Vitamin (MULTIVITAMIN) tablet Take 1 tablet by mouth 3 (three) times a week.    pantoprazole (PROTONIX) 40 MG tablet Take 1 tablet (40 mg total) by mouth daily.   vitamin B-12 (CYANOCOBALAMIN) 500 MCG tablet Take 500 mcg by mouth daily.   No facility-administered encounter medications on file as of 07/09/2021.    Allergies (verified) Aspirin, Flonase [fluticasone propionate], and Codeine   History: Past Medical History:  Diagnosis Date   Allergic rhinitis    Allergy    Anemia    Anxiety    Depression    Epilepsy ([place])    managed by Dr. Rexene Alberts 2015/2016   GERD (gastroesophageal reflux disease)    History of abnormal cervical Pap smear 1980's   Hyperglycemia    Hyperlipidemia    Hypertension    Hypothyroidism    Insomnia    Obesity (BMI 30.0-34.9) 01/06/2017   Osteopenia    Reflux esophagitis 10/31/2017   EGD   Past Surgical History:  Procedure Laterality Date   COLONOSCOPY  Sept 2015  COLONOSCOPY WITH PROPOFOL N/A 09/10/2017   Procedure: COLONOSCOPY WITH PROPOFOL;  Surgeon: Robert Bellow, MD;  Location: [facility] END[facility]COPY;  Service: Endoscopy;  Laterality: N/A;   Cryo Procedure  1980s   for abnormal pap   ESOPHAGOGASTRODUODENOSCOPY (EGD) WITH PROPOFOL N/A 09/10/2017   Procedure: ESOPHAGOGASTRODUODENOSCOPY (EGD) WITH PROPOFOL;  Surgeon: Robert Bellow, MD;  Location: [facility] ENDOSCOPY;  Service: Endoscopy;  Laterality: N/A;   ESOPHAGOGASTRODUODENOSCOPY (EGD) WITH PROPOFOL N/A 10/29/2017    Procedure: ESOPHAGOGASTRODUODENOSCOPY (EGD) WITH PROPOFOL;  Surgeon: Robert Bellow, MD;  Location: [facility] ENDOSCOPY;  Service: Endoscopy;  Laterality: N/A;   EYE SURGERY Bilateral 2021   cataract removal   LIPOMA EXCISION N/A 12/12/2017   Procedure: EXCISION BACK LIPOMA;  Surgeon: Robert Bellow, MD;  Location: [facility] ORS;  Service: General;  Laterality: N/A;   TUBAL LIGATION  1969   Family History  Problem Relation Age of Onset   Hyperlipidemia Mother    Hypertension Mother    CAD Mother    Thyroid disease Mother    Heart disease Mother    COPD Father    Thyroid disease Father    Cancer Brother        liver   Alcohol abuse Brother    Hypertension Brother    Liver disease Brother    Thyroid disease Sister    Diabetes Neg Hx    Stroke Neg Hx    Social History   Socioeconomic History   Marital status: Married    Spouse name: Girard Cooter   Number of children: 4   Years of education: Not on file   Highest education level: 10th grade  Occupational History   Occupation: Retired  Tobacco Use   Smoking status: Never   Smokeless tobacco: Never  Vaping Use   Vaping Use: Never used  Substance and Sexual Activity   Alcohol use: Yes    Comment: wine occ   Drug use: No   Sexual activity: Not Currently    Birth control/protection: Post-menopausal  Other Topics Concern   Not on file  Social History Narrative   Not on file   Social Determinants of Health   Financial Resource Strain: Low Risk    Difficulty of Paying Living Expenses: Not hard at all  Food Insecurity: No Food Insecurity   Worried About Charity fundraiser in the Last Year: Never true   [place] in the Last Year: Never true  Transportation Needs: No Transportation Needs   Lack of Transportation (Medical): No   Lack of Transportation (Non-Medical): No  Physical Activity: Inactive   Days of Exercise per Week: 0 days   Minutes of Exercise per Session: 0 min  Stress: No Stress Concern Present   Feeling  of Stress : Not at all  Social Connections: Socially Integrated   Frequency of Communication with Friends and Family: More than three times a week   Frequency of Social Gatherings with Friends and Family: Three times a week   Attends Religious Services: More than 4 times per year   Active Member of Clubs or Organizations: Yes   Attends Music therapist: More than 4 times per year   Marital Status: Married    Tobacco Counseling Counseling given: Not Answered   Clinical Intake:  Pre-visit preparation completed: Yes  Pain : No/denies pain     Nutritional Risks: None Diabetes: No  How often do you need to have someone help you when you read instructions, pamphlets, or other written materials from  your doctor or pharmacy?: 1 - Never  Diabetic?  no  Interpreter Needed?: No  Information entered by :: Leroy Kennedy LPN   Activities of Daily Living In your present state of health, do you have any difficulty performing the following activities: 07/09/2021 02/06/2021  Hearing? N N  Vision? N N  Difficulty concentrating or making decisions? N N  Walking or climbing stairs? N N  Dressing or bathing? N N  Doing errands, shopping? N N  Preparing Food and eating ? N -  Using the Toilet? N -  In the past six months, have you accidently leaked urine? N -  Do you have problems with loss of bowel control? N -  Managing your Medications? N -  Managing your Finances? N -  Housekeeping or managing your Housekeeping? N -  Some recent data might be hidden    Patient Care Team: Venita Lick, NP as PCP - General (Nurse Practitioner) Anabel Bene, MD as Referring Physician (Neurology) Renato Shin, MD as Consulting Physician (Endocrinology) Bary Castilla Forest Gleason, MD as Consulting Physician (General Surgery) Vanita Ingles, RN as Case Manager (General Practice)  Indicate any recent Medical Services you may have received from other than [facility] providers in the past year  (date may be approximate).     Assessment:   This is a routine wellness examination for Dean.  Hearing/Vision screen Hearing Screening - Comments:: No trouble hearing Vision Screening - Comments:: Up to date Dr. Gloriann Loan  Dietary issues and exercise activities discussed: Current Exercise Habits: The patient does not participate in regular exercise at present, Exercise limited by: None identified   Goals Addressed             This Visit's Progress    DIET - INCREASE WATER INTAKE   On track    Recommend to drink at least 6-8 8oz glasses of water per day.     Weight (lb) < 200 lb (90.7 kg)         Depression Screen PHQ 2/9 Scores 07/09/2021 05/08/2021 02/06/2021 08/01/2020 07/07/2020 02/02/2020 10/13/2019  PHQ - 2 Score 0 0 0 0 0 0 0  PHQ- 9 Score - 0 0 1 0 0 -    Fall Risk Fall Risk  07/09/2021 02/06/2021 08/01/2020 07/07/2020 02/01/2019  Falls in the past year? 0 0 0 0 0  Number falls in past yr: 0 0 - - -  Injury with Fall? 0 0 0 - -  Risk for fall due to : - No Fall Risks - Medication side effect -  Follow up Falls evaluation completed;Falls prevention discussed Falls prevention discussed - Falls evaluation completed;Education provided;Falls prevention discussed -    FALL RISK PREVENTION PERTAINING TO THE HOME:  Any stairs in or around the home? No  If so, are there any without handrails? No  Home free of loose throw rugs in walkways, pet beds, electrical cords, etc? Yes  Adequate lighting in your home to reduce risk of falls? Yes   ASSISTIVE DEVICES UTILIZED TO PREVENT FALLS:  Life alert? No  Use of a cane, [place] or w/c? No  Grab bars in the bathroom? Yes  Shower chair or bench in shower? No  Elevated toilet seat or a handicapped toilet? No   TIMED UP AND GO:  Was the test performed? No .    Cognitive Function:     6CIT Screen 07/07/2020 02/01/2019 11/14/2017 11/20/2016  What Year? 0 points 0 points 0 points 0 points  What month?  0 points 0 points 0 points 0 points  What  time? 0 points 0 points 0 points 0 points  Count back from 20 0 points 0 points 0 points 0 points  Months in reverse 2 points 0 points 0 points 2 points  Repeat phrase 4 points 0 points 2 points 2 points  Total Score 6 0 2 4    Immunizations Immunization History  Administered Date(s) Administered   Fluad Quad(high Dose 65+) 02/01/2019   Influenza, High Dose Seasonal PF 03/14/2017, 03/10/2018, 03/07/2021   Influenza,inj,Quad PF,6+ Mos 03/02/2015   Influenza-Unspecified 04/16/2016, 02/15/2020   PFIZER(Purple Top)SARS-COV-2 Vaccination 07/14/2019, 08/04/2019, 03/03/2020, 02/19/2021   Pneumococcal Conjugate-13 11/15/2013   Pneumococcal Polysaccharide-23 07/04/2015   Td 07/04/1998   Zoster, Live 06/03/2010    TDAP status: Due, Education has been provided regarding the importance of this vaccine. Advised may receive this vaccine at local pharmacy or Health Dept. Aware to provide a copy of the vaccination record if obtained from local pharmacy or Health Dept. Verbalized acceptance and understanding.  Flu Vaccine status: Up to date  Pneumococcal vaccine status: Up to date  Covid-19 vaccine status: Information provided on how to obtain vaccines.   Qualifies for Shingles Vaccine? Yes   Zostavax completed Yes   Shingrix Completed?: No.    Education has been provided regarding the importance of this vaccine. Patient has been advised to call insurance company to determine out of pocket expense if they have not yet received this vaccine. Advised may also receive vaccine at local pharmacy or Health Dept. Verbalized acceptance and understanding.  Screening Tests Health Maintenance  Topic Date Due   COVID-19 Vaccine (5 - Booster for Pfizer series) 04/16/2021   MAMMOGRAM  06/09/2021   TETANUS/TDAP  08/01/2021 (Originally 07/04/2008)   Zoster Vaccines- Shingrix (1 of 2) 10/06/2021 (Originally 11/16/1998)   COLONOSCOPY (Pts 45-6yrs Insurance coverage will need to be confirmed)  09/11/2022   DEXA  SCAN  02/16/2024   Pneumonia Vaccine 68+ Years old  Completed   INFLUENZA VACCINE  Completed   Hepatitis C Screening  Addressed   HPV VACCINES  Aged Out    Health Maintenance  Health Maintenance Due  Topic Date Due   COVID-19 Vaccine (5 - Booster for [employer] series) 04/16/2021   MAMMOGRAM  06/09/2021    Colorectal cancer screening: Type of screening: Colonoscopy. Completed 2019. Repeat every 5 years  Mammogram status: Completed  . Repeat every year  Bone Density status: Completed 2017. Results reflect: Bone density results: OSTEOPENIA. Repeat every 5 years.  Patient is aware when she is due will have with mammogram   Lung Cancer Screening: (Low Dose CT Chest recommended if Age 52-80 years, 30 pack-year currently smoking OR have quit w/in 15years.) does not qualify.   Lung Cancer Screening Referral:   Additional Screening:  Hepatitis C Screening: does not qualify; Completed 2015  Vision Screening: Recommended annual ophthalmology exams for early detection of glaucoma and other disorders of the eye. Is the patient up to date with their annual eye exam?  Yes  Who is the provider or what is the name of the office in which the patient attends annual eye exams? Dr. Gloriann Loan If pt is not established with a provider, would they like to be referred to a provider to establish care? No .   Dental Screening: Recommended annual dental exams for proper oral hygiene  Community Resource Referral / Chronic Care Management: CRR required this visit?  No   CCM required this visit?  No  Plan:     I have personally reviewed and noted the following in the patients chart:   Medical and social history Use of alcohol, tobacco or illicit drugs  Current medications and supplements including opioid prescriptions.  Functional ability and status Nutritional status Physical activity Advanced directives List of other physicians Hospitalizations, surgeries, and ER visits in previous 12  months Vitals Screenings to include cognitive, depression, and falls Referrals and appointments  In addition, I have reviewed and discussed with patient certain preventive protocols, quality metrics, and best practice recommendations. A written personalized care plan for preventive services as well as general preventive health recommendations were provided to patient.     Leroy Kennedy, LPN   09/07/3956   Nurse Notes:

## 2021-07-31 ENCOUNTER — Ambulatory Visit (INDEPENDENT_AMBULATORY_CARE_PROVIDER_SITE_OTHER): Payer: PPO

## 2021-07-31 ENCOUNTER — Telehealth: Payer: PPO

## 2021-07-31 DIAGNOSIS — E782 Mixed hyperlipidemia: Secondary | ICD-10-CM | POA: Diagnosis not present

## 2021-07-31 DIAGNOSIS — I10 Essential (primary) hypertension: Secondary | ICD-10-CM | POA: Diagnosis not present

## 2021-07-31 NOTE — Chronic Care Management (AMB) (Signed)
Chronic Care Management   CCM RN Visit Note  07/31/2021 Name: Rachel Cox MRN: [id number] DOB: [birth year]  Subjective: Rachel Cox is a 71 y.o. year old female who is a primary care patient of Cannady, Barbaraann Faster, NP. The care management team was consulted for assistance with disease management and care coordination needs.    Engaged with patient by telephone for follow up visit in response to provider referral for case management and/or care coordination services.   Consent to Services:  The patient was given information about Chronic Care Management services, agreed to services, and gave verbal consent prior to initiation of services.  Please see initial visit note for detailed documentation.   Patient agreed to services and verbal consent obtained.   Assessment: Review of patient past medical history, allergies, medications, health status, including review of consultants reports, laboratory and other test data, was performed as part of comprehensive evaluation and provision of chronic care management services.   SDOH (Social Determinants of Health) assessments and interventions performed:    CCM Care Plan  Allergies  Allergen Reactions   Aspirin Nausea Only   Flonase [Fluticasone Propionate]     Make glaucoma worse   Codeine Palpitations    Pt vocalized    Outpatient Encounter Medications as of 07/31/2021  Medication Sig   atorvastatin (LIPITOR) 40 MG tablet Take 1 tablet (40 mg total) by mouth at bedtime.   Cholecalciferol 25 MCG (1000 UT) tablet Take 1 tablet (1,000 Units total) by mouth daily.   dorzolamide (TRUSOPT) 2 % ophthalmic solution 1 drop 2 (two) times daily.   dorzolamide-timolol (COSOPT) 22.3-6.8 MG/ML ophthalmic solution 1 drop 2 (two) times daily.   escitalopram (LEXAPRO) 10 MG tablet Take 10 mg by mouth daily.   folic acid (FOLVITE) 409 MCG tablet Take 400 mcg by mouth daily.   hydrochlorothiazide (HYDRODIURIL) 25 MG tablet Take 0.5 tablets (12.5 mg total)  by mouth daily.   levETIRAcetam (KEPPRA) 250 MG tablet Take 250 mg by mouth at bedtime.    levETIRAcetam (KEPPRA) 250 MG tablet Take by mouth.   levothyroxine (SYNTHROID) 75 MCG tablet Take 1 tablet (75 mcg total) by mouth daily. Take one tablet every morning   losartan (COZAAR) 100 MG tablet Take 1 tablet (100 mg total) by mouth daily.   mirtazapine (REMERON) 30 MG tablet Take 1 tablet (30 mg total) by mouth every morning.   mirtazapine (REMERON) 30 MG tablet Take by mouth.   Multiple Vitamin (MULTIVITAMIN) tablet Take 1 tablet by mouth 3 (three) times a week.    pantoprazole (PROTONIX) 40 MG tablet Take 1 tablet (40 mg total) by mouth daily.   vitamin B-12 (CYANOCOBALAMIN) 500 MCG tablet Take 500 mcg by mouth daily.   No facility-administered encounter medications on file as of 07/31/2021.    Patient Active Problem List   Diagnosis Date Noted   Primary open-angle glaucoma, bilateral, mild stage 08/01/2020   History of scarlet fever 08/02/2019   CKD (chronic kidney disease) stage 3, GFR 30-59 ml/min (HCC) 11/24/2017   Obesity 11/24/2017   Reflux esophagitis 81/19/1478   History of Helicobacter pylori infection 10/14/2017   Iron deficiency anemia 07/28/2017   Vitamin D deficiency 03/02/2015   Microalbuminuria 03/02/2015   Thyroid nodule 03/02/2015   Allergic dermatitis 12/22/2014   Allergic rhinitis    Insomnia secondary to anxiety    Depression, major, single episode, mild ([place])    Epilepsy ([place])    Osteopenia    Hyperlipemia 11/30/2013   Essential hypertension  11/30/2013   Hypothyroidism 11/30/2013   B12 deficiency 11/30/2013    Conditions to be addressed/monitored:HTN and HLD  Care Plan : RNCM: Hypertension (Adult)  Updates made by Vanita Ingles, RN since 07/31/2021 12:00 AM     Problem: RNCM: Hypertension (Hypertension)   Priority: Medium     Long-Range Goal: Hypertension Monitored   Start Date: 09/21/2020  Expected End Date: 12/01/2021  This Visit's Progress: On  track  Recent Progress: On track  Priority: Medium  Note:   Objective:  Last practice recorded BP readings:  BP Readings from Last 3 Encounters:  05/08/21 132/77  05/04/21 140/78  02/06/21 136/81    Most recent eGFR/CrCl:  Lab Results  Component Value Date   EGFR 51 (L) 05/04/2021    No components found for: CRCL Current Barriers:  Knowledge Deficits related to basic understanding of hypertension pathophysiology and self care management Knowledge Deficits related to understanding of medications prescribed for management of hypertension Does not contact provider office for questions/concerns Case Manager Clinical Goal(s):  patient will verbalize understanding of plan for hypertension management patient will attend all scheduled medical appointments: 08-06-2021 patient will demonstrate improved adherence to prescribed treatment plan for hypertension as evidenced by taking all medications as prescribed, monitoring and recording blood pressure as directed, adhering to low sodium/DASH diet patient will demonstrate improved health management independence as evidenced by checking blood pressure as directed and notifying PCP if SBP>160 or DBP > 90, taking all medications as prescribe, and adhering to a low sodium diet as discussed. patient will verbalize basic understanding of hypertension disease process and self health management plan as evidenced by medication compliance, heart healthy diet compliance and working with the CCM team to manage health and well being.  Interventions:  Collaboration with Venita Lick, NP regarding development and update of comprehensive plan of care as evidenced by provider attestation and co-signature Inter-disciplinary care team collaboration (see longitudinal plan of care) Evaluation of current treatment plan related to hypertension self management and patient's adherence to plan as established by provider. 12-06-2020: The patient states that her blood  pressures have been good at home but she does have white coat syndrome and her blood pressure shoots up at the provider office. Education and support given. 02-27-2021: The patient is doing well and denies any acute distress. The patient states she feels good and is continuing with her normal routine. 05-15-2021: The patient is doing well with blood pressure management. Currently has a head cold but is taking OTC products. Knows to call the office for changes or new concerns. Will continue to monitor. 07-31-2021: The patient denies any issues with HTN or heart health. Will continue to monitor. Will transition to new care plan at the next outreach. Provided education to patient re: stroke prevention, s/s of heart attack and stroke, DASH diet, complications of uncontrolled blood pressure Reviewed medications with patient and discussed importance of compliance. 07-31-2021: States compliance with medications  Discussed plans with patient for ongoing care management follow up and provided patient with direct contact information for care management team Advised patient, providing education and rationale, to monitor blood pressure daily and record, calling PCP for findings outside established parameters.  Reviewed scheduled/upcoming provider appointments including: 08-06-2021 at 0800 am Self-Care Activities: - Self administers medications as prescribed Attends all scheduled provider appointments Calls provider office for new concerns, questions, or BP outside discussed parameters Checks BP and records as discussed Follows a low sodium diet/DASH diet Patient Goals: - check blood pressure weekly -  choose a place to take my blood pressure (home, clinic or office, retail store) - write blood pressure results in a log or diary - agree on reward when goals are met - agree to work together to make changes - ask questions to understand - have a family meeting to talk about healthy habits - learn about high blood  pressure - blood pressure trends reviewed - depression screen reviewed - home or ambulatory blood pressure monitoring encouraged     Care Plan : RNCM: HLD Management  Updates made by Vanita Ingles, RN since 07/31/2021 12:00 AM     Problem: RNCM: HLD Management   Priority: Medium     Long-Range Goal: RNCM: Self-Management Plan Developed   Start Date: 09/21/2020  Expected End Date: 10/22/2021  Recent Progress: On track  Priority: Medium  Note:   Current Barriers:  Poorly controlled hyperlipidemia, complicated by history of seizures, HTN Current antihyperlipidemic regimen: Lipitor 40 mg QD Most recent lipid panel:     Component Value Date/Time   CHOL 186 02/06/2021 0931   TRIG 105 02/06/2021 0931   HDL 79 02/06/2021 0931   CHOLHDL 2.6 07/13/2018 1015   VLDL 16 01/06/2017 0928   LDLCALC 89 02/06/2021 0931   LDLCALC 103 (H) 07/13/2018 1015   ASCVD risk enhancing conditions: age >44, HTN, CKD Lacks social connections Does not contact provider office for questions/concerns RN Care Manager Clinical Goal(s):  patient will work with Consulting civil engineer, providers, and care team towards execution of optimized self-health management plan patient will verbalize understanding of plan for effective management of HLD  patient will work with [facility] and pcp  to address needs related to HLD patient will attend all scheduled medical appointments: 08-06-2021 at 0800 am Interventions: Collaboration with Venita Lick, NP regarding development and update of comprehensive plan of care as evidenced by provider attestation and co-signature Inter-disciplinary care team collaboration (see longitudinal plan of care) Medication review performed; medication list updated in electronic medical record.  Inter-disciplinary care team collaboration (see longitudinal plan of care) Referred to pharmacy team for assistance with HLD medication management Evaluation of current treatment plan related to HLD  and  patient's adherence to plan as established by provider. 07-31-2021: The patient states that she is doing well. Denies any issues with medications compliance or dietary restrictions. Will continue to monitor for changes.  Advised patient to call the office for questions or concerns Provided education to patient re: heart healthy diet, medication compliance and working with CCM team to optimize health and well being  Reviewed scheduled/upcoming provider appointments including: 08-06-2021 at 0800 am Discussed plans with patient for ongoing care management follow up and provided patient with direct contact information for care management team Patient Goals/Self-Care Activities: - call for medicine refill 2 or 3 days before it runs out - call if I am sick and can't take my medicine - keep a list of all the medicines I take; vitamins and herbals too - learn to read medicine labels - use a pillbox to sort medicine - use an alarm clock or phone to remind me to take my medicine - change to whole grain breads, cereal, pasta - drink 6 to 8 glasses of water each day - eat 3 to 5 servings of fruits and vegetables each day - eat 5 or 6 small meals each day - fill half the plate with nonstarchy vegetables - limit fast food meals to no more than 1 per week - manage portion size - prepare  main meal at home 3 to 5 days each week - be open to making changes - I can manage, know and watch for signs of a heart attack - if I have chest pain, call for help - learn about small changes that will make a big difference - learn my personal risk factors - barriers to meeting goals identified - change-talk evoked - choices provided - collaboration with team encouraged - decision-making supported - difficulty of making life-long changes acknowledged - health risks reviewed - problem-solving facilitated - questions answered - readiness for change evaluated - reassurance provided - self-reflection promoted -  self-reliance encouraged       Plan:Telephone follow up appointment with care management team member scheduled for:  10-09-2021 at 1 pm  [place], MSN, [facility] Mobile: [phone]

## 2021-07-31 NOTE — Patient Instructions (Signed)
Visit Information  Thank you for taking time to visit with me today. Please don't hesitate to contact me if I can be of assistance to you before our next scheduled telephone appointment.  Following are the goals we discussed today:  Interventions:  Collaboration with Venita Lick, NP regarding development and update of comprehensive plan of care as evidenced by provider attestation and co-signature Inter-disciplinary care team collaboration (see longitudinal plan of care) Evaluation of current treatment plan related to hypertension self management and patient's adherence to plan as established by provider. 12-06-2020: The patient states that her blood pressures have been good at home but she does have white coat syndrome and her blood pressure shoots up at the provider office. Education and support given. 02-27-2021: The patient is doing well and denies any acute distress. The patient states she feels good and is continuing with her normal routine. 05-15-2021: The patient is doing well with blood pressure management. Currently has a head cold but is taking OTC products. Knows to call the office for changes or new concerns. Will continue to monitor. 07-31-2021: The patient denies any issues with HTN or heart health. Will continue to monitor. Will transition to new care plan at the next outreach. Provided education to patient re: stroke prevention, s/s of heart attack and stroke, DASH diet, complications of uncontrolled blood pressure Reviewed medications with patient and discussed importance of compliance. 07-31-2021: States compliance with medications  Discussed plans with patient for ongoing care management follow up and provided patient with direct contact information for care management team Advised patient, providing education and rationale, to monitor blood pressure daily and record, calling PCP for findings outside established parameters.  Reviewed scheduled/upcoming provider appointments  including: 08-06-2021 at 0800 am Self-Care Activities: - Self administers medications as prescribed Attends all scheduled provider appointments Calls provider office for new concerns, questions, or BP outside discussed parameters Checks BP and records as discussed Follows a low sodium diet/DASH diet Patient Goals: - check blood pressure weekly - choose a place to take my blood pressure (home, clinic or office, retail store) - write blood pressure results in a log or diary - agree on reward when goals are met - agree to work together to make changes - ask questions to understand - have a family meeting to talk about healthy habits - learn about high blood pressure - blood pressure trends reviewed - depression screen reviewed - home or ambulatory blood pressure monitoring encouraged        Care Plan : RNCM: HLD Management  Updates made by Vanita Ingles, RN since 07/31/2021 12:00 AM       Problem: RNCM: HLD Management    Priority: Medium       Long-Range Goal: RNCM: Self-Management Plan Developed    Start Date: 09/21/2020  Expected End Date: 10/22/2021  Recent Progress: On track  Priority: Medium  Note:   Current Barriers:  Poorly controlled hyperlipidemia, complicated by history of seizures, HTN Current antihyperlipidemic regimen: Lipitor 40 mg QD Most recent lipid panel:          Component Value Date/Time    CHOL 186 02/06/2021 0931    TRIG 105 02/06/2021 0931    HDL 79 02/06/2021 0931    CHOLHDL 2.6 07/13/2018 1015    VLDL 16 01/06/2017 0928    LDLCALC 89 02/06/2021 0931    LDLCALC 103 (H) 07/13/2018 1015    ASCVD risk enhancing conditions: age >31, HTN, CKD Lacks social connections Does not contact provider office  for questions/concerns RN Care Manager Clinical Goal(s):  patient will work with Medical illustrator, providers, and care team towards execution of optimized self-health management plan patient will verbalize understanding of plan for effective management of  HLD  patient will work with [facility] and pcp  to address needs related to HLD patient will attend all scheduled medical appointments: 08-06-2021 at 0800 am Interventions: Collaboration with Marjie Skiff, NP regarding development and update of comprehensive plan of care as evidenced by provider attestation and co-signature Inter-disciplinary care team collaboration (see longitudinal plan of care) Medication review performed; medication list updated in electronic medical record.  Inter-disciplinary care team collaboration (see longitudinal plan of care) Referred to pharmacy team for assistance with HLD medication management Evaluation of current treatment plan related to HLD  and patient's adherence to plan as established by provider. 07-31-2021: The patient states that she is doing well. Denies any issues with medications compliance or dietary restrictions. Will continue to monitor for changes.  Advised patient to call the office for questions or concerns Provided education to patient re: heart healthy diet, medication compliance and working with CCM team to optimize health and well being  Reviewed scheduled/upcoming provider appointments including: 08-06-2021 at 0800 am Discussed plans with patient for ongoing care management follow up and provided patient with direct contact information for care management team Patient Goals/Self-Care Activities: - call for medicine refill 2 or 3 days before it runs out - call if I am sick and can't take my medicine - keep a list of all the medicines I take; vitamins and herbals too - learn to read medicine labels - use a pillbox to sort medicine - use an alarm clock or phone to remind me to take my medicine - change to whole grain breads, cereal, pasta - drink 6 to 8 glasses of water each day - eat 3 to 5 servings of fruits and vegetables each day - eat 5 or 6 small meals each day - fill half the plate with nonstarchy vegetables - limit fast food meals to no  more than 1 per week - manage portion size - prepare main meal at home 3 to 5 days each week - be open to making changes - I can manage, know and watch for signs of a heart attack - if I have chest pain, call for help - learn about small changes that will make a big difference - learn my personal risk factors - barriers to meeting goals identified - change-talk evoked - choices provided - collaboration with team encouraged - decision-making supported - difficulty of making life-long changes acknowledged - health risks reviewed - problem-solving facilitated - questions answered - readiness for change evaluated - reassurance provided - self-reflection promoted - self-reliance encouraged          Our next appointment is by telephone on 10-09-2021 at 1 pm  Please call the care guide team at [phone] if you need to cancel or reschedule your appointment.   If you are experiencing a Mental Health or Behavioral Health Crisis or need someone to talk to, please call the Suicide and Crisis Lifeline: 988 call the [place] National Suicide Prevention Lifeline: 817-278-4164 or TTY: 9106719379 TTY 901-403-6998) to talk to a trained counselor call 1-800-273-TALK (toll free, 24 hour hotline)   Patient verbalizes understanding of instructions and care plan provided today and agrees to view in MyChart. Active MyChart status confirmed with patient.    Alto Denver RN, MSN, CCM Community Care Coordinator [facility]  [facility] manager Mobile: [phone]

## 2021-08-05 NOTE — Patient Instructions (Signed)
Proteinuria °Proteinuria is when there is too much protein in the urine. Proteins are important for building muscles and bones. Proteins are also needed to fight infections, help the blood to clot, and keep body fluids in balance. °Proteinuria may be mild and temporary, or it may be an early sign of kidney disease. The kidneys make urine. Healthy kidneys also keep substances like proteins from leaving the blood and ending up in the urine. °What are the causes? °This condition may be caused by damage to the kidneys or by temporary causes such as fever or stress. °Proteinuria may happen when the kidneys are not working well. Healthy kidneys have filters (glomeruli) that keep proteins out of the urine. Proteinuria may mean that the glomeruli are damaged. The main causes of this type of damage are: °Diabetes. °High blood pressure. °Other causes of kidney damage can also cause proteinuria, such as: °Diseases of the immune system, such as lupus, rheumatoid arthritis, sarcoidosis, and Goodpasture syndrome. °Heart disease or heart failure. °Kidney infection. °Certain cancers, including kidney cancer, lymphoma, leukemia, and multiple myeloma. °Amyloidosis. This is a disease that causes abnormal proteins to build up in body tissues. °Reactions to certain medicines, such as NSAIDs. °Injuries or poisons (toxins). °High blood pressure that occurs during pregnancy (preeclampsia and eclampsia). °Temporary proteinuria may result from conditions that put stress on the kidneys. These conditions usually do not cause kidney damage. They include: °Fever. °Exposure to cold or heat. °Emotional or physical stress. °Extreme exercise. °Standing for long periods of time. °What increases the risk? °You are more likely to develop this condition if you: °Have diabetes. °Have high blood pressure. °Have heart disease or heart failure. °Have an immune disease, cancer, or other disease that affects the kidneys. °Have a family history of kidney  disease. °Are 65 years of age or older. °Are overweight. °Are of African American, American Indian, Hispanic/Latino, or Pacific Islander descent. °Are pregnant. °Have an infection. °What are the signs or symptoms? °Mild proteinuria may not cause symptoms. As more proteins enter the urine, symptoms of kidney disease may develop, such as: °Foamy urine. °Swelling of the face, abdomen, hands, legs, or feet (edema). °Needing to urinate frequently. °Fatigue. °Difficulty sleeping. °Dry and itchy skin. °Nausea and vomiting. °Muscle cramps. °Shortness of breath. °How is this diagnosed? °This condition may be diagnosed with a urine test. You may have this test as part of a routine physical exam or because you have symptoms of kidney disease or risk factors for kidney disease. You may also have: °Blood tests to measure the level of a certain substance (creatinine) that increases with kidney disease. °Imaging tests of your kidney, such as a CT scan or an ultrasound, to look for signs of kidney damage. °How is this treated? °If your proteinuria is mild or temporary, treatment may not be needed for this condition. Your health care provider may show you how to monitor the level of protein in your urine at home. Identifying proteinuria early is important so that the cause of the condition can be treated. °Treatment for this condition depends on the cause of your proteinuria. Treatment may include: °Making diet and lifestyle changes. °Getting blood pressure under control. °Getting blood sugar under control, if you have diabetes. °Managing any other medical conditions you have that affect your kidneys. °Giving birth, if you are pregnant. °Avoiding medicines that damage your kidneys. °In severe cases, kidney disease may need to be treated with medicines or dialysis. °Follow these instructions at home: °Activity °Return to your normal activities   as told by your health care provider. Ask your health care provider what activities are  safe for you. °Ask your health care provider to recommend an exercise program. °General instructions °Check your protein levels at home if directed by your health care provider. °Follow instructions from your health care provider about eating or drinking restrictions. °If you are overweight, ask your health care provider about diets that can help you get to a healthy weight. °Take over-the-counter and prescription medicines only as told by your health care provider. °Keep all follow-up visits as told by your health care provider. This is important. °Contact a health care provider if: °You have new symptoms. °Your symptoms get worse or do not improve. °Get help right away if you: °Have back pain. °Have diarrhea. °Vomit. °Have a fever. °Have a rash. °Summary °Proteinuria is when there is too much protein in the urine. °Proteinuria may be mild and temporary, or it may be an early sign of kidney disease. °This condition may be diagnosed with a urine test. °Treatment for this condition depends on the cause of your proteinuria. °Treatment may include diet and lifestyle changes, blood pressure and blood sugar management, and avoiding medicines that may damage the kidneys. If the proteinuria is severe, it may need to be treated with medicines or dialysis. °This information is not intended to replace advice given to you by your health care provider. Make sure you discuss any questions you have with your health care provider. °Document Revised: 01/09/2021 Document Reviewed: 01/09/2021 °Elsevier Patient Education © 2022 [employer] ° °

## 2021-08-06 ENCOUNTER — Encounter: Payer: Self-pay | Admitting: Nurse Practitioner

## 2021-08-06 ENCOUNTER — Ambulatory Visit (INDEPENDENT_AMBULATORY_CARE_PROVIDER_SITE_OTHER): Payer: PPO | Admitting: Nurse Practitioner

## 2021-08-06 ENCOUNTER — Other Ambulatory Visit: Payer: Self-pay

## 2021-08-06 VITALS — BP 126/78 | HR 82 | Temp 98.2°F | Ht 64.0 in | Wt 191.6 lb

## 2021-08-06 DIAGNOSIS — R809 Proteinuria, unspecified: Secondary | ICD-10-CM

## 2021-08-06 DIAGNOSIS — E041 Nontoxic single thyroid nodule: Secondary | ICD-10-CM | POA: Diagnosis not present

## 2021-08-06 DIAGNOSIS — F419 Anxiety disorder, unspecified: Secondary | ICD-10-CM

## 2021-08-06 DIAGNOSIS — M8588 Other specified disorders of bone density and structure, other site: Secondary | ICD-10-CM

## 2021-08-06 DIAGNOSIS — E039 Hypothyroidism, unspecified: Secondary | ICD-10-CM

## 2021-08-06 DIAGNOSIS — N1831 Chronic kidney disease, stage 3a: Secondary | ICD-10-CM | POA: Diagnosis not present

## 2021-08-06 DIAGNOSIS — I10 Essential (primary) hypertension: Secondary | ICD-10-CM

## 2021-08-06 DIAGNOSIS — E6609 Other obesity due to excess calories: Secondary | ICD-10-CM

## 2021-08-06 DIAGNOSIS — F5105 Insomnia due to other mental disorder: Secondary | ICD-10-CM

## 2021-08-06 DIAGNOSIS — H401131 Primary open-angle glaucoma, bilateral, mild stage: Secondary | ICD-10-CM

## 2021-08-06 DIAGNOSIS — E538 Deficiency of other specified B group vitamins: Secondary | ICD-10-CM | POA: Diagnosis not present

## 2021-08-06 DIAGNOSIS — E782 Mixed hyperlipidemia: Secondary | ICD-10-CM

## 2021-08-06 DIAGNOSIS — G40802 Other epilepsy, not intractable, without status epilepticus: Secondary | ICD-10-CM

## 2021-08-06 DIAGNOSIS — Z6832 Body mass index (BMI) 32.0-32.9, adult: Secondary | ICD-10-CM

## 2021-08-06 DIAGNOSIS — F320 Major depressive disorder, single episode, mild: Secondary | ICD-10-CM | POA: Diagnosis not present

## 2021-08-06 DIAGNOSIS — E559 Vitamin D deficiency, unspecified: Secondary | ICD-10-CM | POA: Diagnosis not present

## 2021-08-06 DIAGNOSIS — D508 Other iron deficiency anemias: Secondary | ICD-10-CM

## 2021-08-06 LAB — MICROALBUMIN, URINE WAIVED
Creatinine, Urine Waived: 50 mg/dL (ref 10–300)
Microalb, Ur Waived: 10 mg/L (ref 0–19)
Microalb/Creat Ratio: <30 mg/g (ref <30)

## 2021-08-06 MED ORDER — ESCITALOPRAM OXALATE 10 MG PO TABS: 10.0000 mg | ORAL_TABLET | Freq: Every day | ORAL | 4 refills | Status: DC

## 2021-08-06 MED ORDER — MIRTAZAPINE 30 MG PO TABS
30.0000 mg | ORAL_TABLET | ORAL | 4 refills | Status: DC
Start: 2021-08-06 — End: 2023-02-12

## 2021-08-06 NOTE — Assessment & Plan Note (Signed)
Chronic, ongoing.  Continue current medication regimen and adjust as needed.  Lipid panel today.  Return in 6 months. 

## 2021-08-06 NOTE — Assessment & Plan Note (Signed)
Chronic, ongoing.  Continue current medication regimen as recommended by Dr. Lissa Merlin and collaboration with endocrinology.  Recent notes and labs reviewed. ?

## 2021-08-06 NOTE — Assessment & Plan Note (Signed)
BMI 32.89.  Recommended eating smaller high protein, low fat meals more frequently and exercising 30 mins a day 5 times a week with a goal of 10-15lb weight loss in the next 3 months. Patient voiced their understanding and motivation to adhere to these recommendations. ? ?

## 2021-08-06 NOTE — Assessment & Plan Note (Signed)
Chronic, ongoing with BP at goal for age today and on occasional home readings at goal.  Recommend she monitor BP at least a few mornings a week at home and document.  DASH diet at home.  Continue current medication regimen and adjust as needed.  Labs: BMP and urine ALB.  Return in 6 months. ? ?

## 2021-08-06 NOTE — Assessment & Plan Note (Signed)
Chronic, stable.  Check CBC, ferritin, iron, and B12 at physical.  Continue daily supplement and adjust plan of care as needed based on labs. ?

## 2021-08-06 NOTE — Progress Notes (Signed)
BP 126/78 (BP Location: Left Arm, Patient Position: Sitting, Cuff Size: Normal)    Pulse 82    Temp 98.2 F (36.8 C) (Oral)    Ht '5\' 4"'$  (1.626 m)    Wt 191 lb 10.1 oz (86.9 kg)    SpO2 99%    BMI 32.89 kg/m    Subjective:    Patient ID: Rachel Cox, female    DOB: April 26, 1951, 71 y.o.   MRN: [id number]  HPI: Rachel Cox is a 71 y.o. female  Chief Complaint  Patient presents with   Hypertension   Hyperlipidemia   Chronic Kidney Disease   Mood    s   Seizures   HYPERTENSION / HYPERLIPIDEMIA Continues on Losartan 100 MG daily, HCTZ 12.5 MG daily, and Atorvastatin 40 MG daily.    Has eye exam every 4 months ago and diagnosed with open-angle glaucoma, bilateral.   Satisfied with current treatment? yes Duration of hypertension: chronic BP monitoring frequency: once a week BP range: 130-140/70-80 BP medication side effects: yes Duration of hyperlipidemia: chronic Cholesterol medication side effects: no Cholesterol supplements: none Medication compliance: good compliance Aspirin: no Recent stressors: no Recurrent headaches: no Visual changes: no Palpitations: no Dyspnea: no Chest pain: no Lower extremity edema: no Dizzy/lightheaded: no   SEIZURE DISORDER Followed by neurology, recently saw on 05/07/21.  Has had no seizure since 2016. Keppra 250 MG at bedtime -- level in September 2021 was 6.2.     ANEMIA Takes B12 and multivitamin daily at this time. Anemia status: stable Etiology of anemia: Duration of anemia treatment:  Compliance with treatment: good compliance Iron supplementation side effects: no Severity of anemia: mild Fatigue: no Decreased exercise tolerance: no  Dyspnea on exertion: no Palpitations: no Bleeding: no Pica: no    CHRONIC KIDNEY DISEASE Has underlying osteopenia noted on DEXA August 2020, continues on daily supplements.  No recent falls or fractures. CKD status: stable Medications renally dose: yes Previous renal evaluation:  no Pneumovax:  Up to Date Influenza Vaccine:  Up to Date    HYPOTHYROIDISM Followed by Dr. Lissa Merlin and last seen 10/11/20. Thyroid control status:stable Satisfied with current treatment? yes Medication side effects: no Medication compliance: good compliance Etiology of hypothyroidism:  Recent dose adjustment:no Fatigue: no Cold intolerance: no Heat intolerance: no Weight gain: no Weight loss: no Constipation: no Diarrhea/loose stools: no Palpitations: no Lower extremity edema: no Anxiety/depressed mood: no    ANXIETY/STRESS Continues on Lexapro and Remeron 30 MG for mood and insomnia, reports this works well. Duration:stable Anxious mood: no  Excessive worrying: no Irritability: no  Sweating: no Nausea: no Palpitations:no Hyperventilation: no Panic attacks: no Agoraphobia: no  Obscessions/compulsions: no Depressed mood: no Depression screen [facility] 2/9 08/06/2021 08/06/2021 07/09/2021 05/08/2021 02/06/2021  Decreased Interest 0 0 0 0 0  Down, Depressed, Hopeless 0 0 0 0 0  PHQ - 2 Score 0 0 0 0 0  Altered sleeping 0 - - 0 0  Tired, decreased energy 0 - - 0 0  Change in appetite 0 - - 0 0  Feeling bad or failure about yourself  0 - - 0 0  Trouble concentrating 0 - - 0 0  Moving slowly or fidgety/restless 0 - - 0 0  Suicidal thoughts 0 - - 0 0  PHQ-9 Score 0 - - 0 0  Difficult doing work/chores Not difficult at all - - Not difficult at all -  Some recent data might be hidden   GAD 7 : Generalized Anxiety  Score 08/06/2021 02/02/2020 08/02/2019  Nervous, Anxious, on Edge 0 0 1  Control/stop worrying 0 0 0  Worry too much - different things 0 0 0  Trouble relaxing 0 0 0  Restless 0 0 0  Easily annoyed or irritable 0 0 1  Afraid - awful might happen 0 0 0  Total GAD 7 Score 0 0 2  Anxiety Difficulty Not difficult at all Not difficult at all Not difficult at all      Relevant past medical, surgical, family and social history reviewed and updated as indicated. Interim medical  history since our last visit reviewed. Allergies and medications reviewed and updated.  Review of Systems  Constitutional:  Negative for activity change, appetite change, diaphoresis, fatigue and fever.  Respiratory:  Negative for cough, chest tightness, shortness of breath and wheezing.   Cardiovascular:  Negative for chest pain, palpitations and leg swelling.  Gastrointestinal: Negative.   Endocrine: Negative for cold intolerance and heat intolerance.  Neurological: Negative.   Psychiatric/Behavioral: Negative.     Per HPI unless specifically indicated above     Objective:    BP 126/78 (BP Location: Left Arm, Patient Position: Sitting, Cuff Size: Normal)    Pulse 82    Temp 98.2 F (36.8 C) (Oral)    Ht $R'5\' 4"'KU$  (1.626 m)    Wt 191 lb 10.1 oz (86.9 kg)    SpO2 99%    BMI 32.89 kg/m   Wt Readings from Last 3 Encounters:  08/06/21 191 lb 10.1 oz (86.9 kg)  05/08/21 187 lb (84.8 kg)  05/04/21 187 lb (84.8 kg)    Physical Exam Vitals and nursing note reviewed.  Constitutional:      General: She is awake. She is not in acute distress.    Appearance: She is well-developed and overweight. She is not ill-appearing.  HENT:     Head: Normocephalic.     Right Ear: Hearing normal.     Left Ear: Hearing normal.  Eyes:     General: Lids are normal.        Right eye: No discharge.        Left eye: No discharge.     Conjunctiva/sclera: Conjunctivae normal.     Pupils: Pupils are equal, round, and reactive to light.  Neck:     Thyroid: No thyromegaly.     Vascular: No carotid bruit.  Cardiovascular:     Rate and Rhythm: Normal rate and regular rhythm.     Heart sounds: Normal heart sounds. No murmur heard.   No gallop.  Pulmonary:     Effort: Pulmonary effort is normal. No accessory muscle usage or respiratory distress.     Breath sounds: Normal breath sounds.  Abdominal:     General: Bowel sounds are normal.     Palpations: Abdomen is soft. There is no hepatomegaly or splenomegaly.   Musculoskeletal:     Cervical back: Normal range of motion and neck supple.     Right lower leg: No edema.     Left lower leg: No edema.  Skin:    General: Skin is warm and dry.  Neurological:     Mental Status: She is alert and oriented to person, place, and time.  Psychiatric:        Attention and Perception: Attention normal.        Mood and Affect: Mood normal.        Speech: Speech normal.        Behavior: Behavior normal. Behavior  is cooperative.    Results for orders placed or performed in visit on 38/93/73  Basic metabolic panel  Result Value Ref Range   Glucose 104 (H) 70 - 99 mg/dL   BUN 14 8 - 27 mg/dL   Creatinine, Ser 1.15 (H) 0.57 - 1.00 mg/dL   eGFR 51 (L) >59 mL/min/1.73   BUN/Creatinine Ratio 12 12 - 28   Sodium 143 134 - 144 mmol/L   Potassium 3.6 3.5 - 5.2 mmol/L   Chloride 105 96 - 106 mmol/L   CO2 24 20 - 29 mmol/L   Calcium 10.3 8.7 - 10.3 mg/dL  Microalbumin, Urine Waived  Result Value Ref Range   Microalb, Ur Waived 150 (H) 0 - 19 mg/L   Creatinine, Urine Waived 300 10 - 300 mg/dL   Microalb/Creat Ratio 30-300 (H) <30 mg/g      Assessment & Plan:   Problem List Items Addressed This Visit       Cardiovascular and Mediastinum   Essential hypertension    Chronic, ongoing with BP at goal for age today and on occasional home readings at goal.  Recommend she monitor BP at least a few mornings a week at home and document.  DASH diet at home.  Continue current medication regimen and adjust as needed.  Labs: BMP and urine ALB.  Return in 6 months.       Relevant Orders   Basic metabolic panel   Microalbumin, Urine Waived     Endocrine   Hypothyroidism    Chronic, ongoing.  Continue current medication regimen as recommended by Dr. Lissa Merlin and collaboration with endocrinology.  Recent notes and labs reviewed.      Thyroid nodule    Chronic, ongoing.  Continue current medication regimen as recommended by Dr. Lissa Merlin and collaboration with  endocrinology.          Nervous and Auditory   Epilepsy ([place]) - Primary    Chronic, stable.  Continue current medication regimen as recommended by neuro and collaboration with them.  BMP and Keppra leve today.  Recent neuro note reviewed.      Relevant Orders   Levetiracetam level     Musculoskeletal and Integument   Osteopenia (Chronic)    Ongoing with DEXA last in 2020 -- repeat in May 2025.  Continue Vit D daily and recommend fall precautions.  Check Vit D level at physical.        Genitourinary   CKD (chronic kidney disease) stage 3, GFR 30-59 ml/min (HCC) (Chronic)    Chronic, stable.  Avoid NSAIDs.  Continue Losartan for kidney protection.  Labs BMP and urine ALB.  Consider nephrology referral if any decline.      Relevant Orders   Basic metabolic panel   Microalbumin, Urine Waived     Other   B12 deficiency    Chronic, ongoing.  Continue daily supplement and adjust as needed.  Check B12 level at physical.      Depression, major, single episode, mild (HCC)    Chronic, ongoing.  Continue current medication regimen, Remeron and Lexapro, and adjust as needed -- currently followed by neurology for this.  Denies SI/HI.  Refills performed by neurology who prescribes.  Return in 6 months for follow-up.      Relevant Medications   mirtazapine (REMERON) 30 MG tablet   escitalopram (LEXAPRO) 10 MG tablet   Hyperlipemia    Chronic, ongoing.  Continue current medication regimen and adjust as needed.  Lipid panel today. Return in 6  months.      Relevant Orders   Lipid Panel w/o Chol/HDL Ratio   Insomnia secondary to anxiety    Chronic, ongoing.  Continue current medication regimen, Remeron, and adjust as needed.        Relevant Medications   mirtazapine (REMERON) 30 MG tablet   escitalopram (LEXAPRO) 10 MG tablet   Iron deficiency anemia    Chronic, stable.  Check CBC, ferritin, iron, and B12 at physical.  Continue daily supplement and adjust plan of care as needed based  on labs.      Microalbuminuria    Continue Losartan for kidney protection, check urine ALB today.      Relevant Orders   Basic metabolic panel   Microalbumin, Urine Waived   Obesity    BMI 32.89.  Recommended eating smaller high protein, low fat meals more frequently and exercising 30 mins a day 5 times a week with a goal of 10-15lb weight loss in the next 3 months. Patient voiced their understanding and motivation to adhere to these recommendations.       Primary open-angle glaucoma, bilateral, mild stage    Followed by Dr. Gloriann Loan, continue this collaboration and recommend avoiding Flonase products.      Vitamin D deficiency    Chronic, ongoing.  Continue daily supplement and adjust as needed.  Last DEXA 2020.  Check Vit D level at physical.        Follow up plan: Return in about 6 months (around 02/07/2022) for Annual physical after 02/06/22.

## 2021-08-06 NOTE — Assessment & Plan Note (Signed)
Chronic, ongoing.  Continue current medication regimen, Remeron and Lexapro, and adjust as needed -- currently followed by neurology for this.  Denies SI/HI.  Refills performed by neurology who prescribes.  Return in 6 months for follow-up. ?

## 2021-08-06 NOTE — Assessment & Plan Note (Signed)
Chronic, ongoing.  Continue daily supplement and adjust as needed.  Check B12 level at physical. ?

## 2021-08-06 NOTE — Assessment & Plan Note (Signed)
Followed by Dr. Bell, continue this collaboration and recommend avoiding Flonase products. 

## 2021-08-06 NOTE — Assessment & Plan Note (Signed)
Chronic, ongoing.  Continue daily supplement and adjust as needed.  Last DEXA 2020.  Check Vit D level at physical. ?

## 2021-08-06 NOTE — Assessment & Plan Note (Signed)
Ongoing with DEXA last in 2020 -- repeat in May 2025.  Continue Vit D daily and recommend fall precautions.  Check Vit D level at physical. ?

## 2021-08-06 NOTE — Assessment & Plan Note (Signed)
Chronic, stable.  Avoid NSAIDs.  Continue Losartan for kidney protection.  Labs BMP and urine ALB.  Consider nephrology referral if any decline. ?

## 2021-08-06 NOTE — Assessment & Plan Note (Signed)
Chronic, ongoing.  Continue current medication regimen as recommended by Dr. Lissa Merlin and collaboration with endocrinology.   ?

## 2021-08-06 NOTE — Assessment & Plan Note (Signed)
Continue Losartan for kidney protection, check urine ALB today. 

## 2021-08-06 NOTE — Assessment & Plan Note (Signed)
Chronic, stable.  Continue current medication regimen as recommended by neuro and collaboration with them.  BMP and Keppra leve today.  Recent neuro note reviewed. ?

## 2021-08-06 NOTE — Assessment & Plan Note (Signed)
Chronic, ongoing.  Continue current medication regimen, Remeron, and adjust as needed.   

## 2021-08-07 LAB — BASIC METABOLIC PANEL
BUN/Creatinine Ratio: 14 (ref 12–28)
BUN: 16 mg/dL (ref 8–27)
CO2: 23 mmol/L (ref 20–29)
Calcium: 9.9 mg/dL (ref 8.7–10.3)
Chloride: 105 mmol/L (ref 96–106)
Creatinine, Ser: 1.14 mg/dL — ABNORMAL HIGH (ref 0.57–1.00)
Glucose: 96 mg/dL (ref 70–99)
Potassium: 3.5 mmol/L (ref 3.5–5.2)
Sodium: 146 mmol/L — ABNORMAL HIGH (ref 134–144)
eGFR: 51 mL/min/{1.73_m2} — ABNORMAL LOW (ref >59)

## 2021-08-07 LAB — LIPID PANEL W/O CHOL/HDL RATIO
Cholesterol, Total: 166 mg/dL (ref 100–199)
HDL: 70 mg/dL (ref >39)
LDL Chol Calc (NIH): 76 mg/dL (ref 0–99)
Triglycerides: 114 mg/dL (ref 0–149)
VLDL Cholesterol Cal: 20 mg/dL (ref 5–40)

## 2021-08-07 LAB — LEVETIRACETAM LEVEL: Levetiracetam Lvl: 7.1 ug/mL — ABNORMAL LOW (ref 10.0–40.0)

## 2021-08-07 NOTE — Progress Notes (Signed)
Contacted via [place] ? ? ?Good evening Rachel Cox, your labs have returned.  Kidney function shows stable kidney disease, no worsening.  Sodium, salt level, a little elevated -- decrease salt intake and increase water.  Keppra level on low side, which is okay we are ensuring this is not too high and it is not.  Cholesterol levels stable.  Any questions? ?Keep being amazing!!  Thank you for allowing me to participate in your care.  I appreciate you. ?Kindest regards, ?Genevieve Arbaugh ?

## 2021-09-20 DIAGNOSIS — Z1231 Encounter for screening mammogram for malignant neoplasm of breast: Secondary | ICD-10-CM | POA: Diagnosis not present

## 2021-09-20 LAB — HM MAMMOGRAPHY

## 2021-10-04 DIAGNOSIS — H40153 Residual stage of open-angle glaucoma, bilateral: Secondary | ICD-10-CM | POA: Diagnosis not present

## 2021-10-08 ENCOUNTER — Ambulatory Visit: Payer: Self-pay

## 2021-10-09 ENCOUNTER — Ambulatory Visit (INDEPENDENT_AMBULATORY_CARE_PROVIDER_SITE_OTHER): Payer: PPO

## 2021-10-09 ENCOUNTER — Telehealth: Payer: PPO

## 2021-10-09 DIAGNOSIS — I10 Essential (primary) hypertension: Secondary | ICD-10-CM

## 2021-10-09 DIAGNOSIS — E039 Hypothyroidism, unspecified: Secondary | ICD-10-CM

## 2021-10-09 DIAGNOSIS — E782 Mixed hyperlipidemia: Secondary | ICD-10-CM

## 2021-10-09 NOTE — Chronic Care Management (AMB) (Signed)
?Chronic Care Management  ? ?CCM RN Visit Note ? ?10/09/2021 ?Name: Rachel Cox MRN: [id number] DOB: [birth year] ? ?Subjective: ?Rachel Cox is a 71 y.o. year old female who is a primary care patient of Cannady, Barbaraann Faster, NP. The care management team was consulted for assistance with disease management and care coordination needs.   ? ?Engaged with patient by telephone for follow up visit in response to provider referral for case management and/or care coordination services.  ? ?Consent to Services:  ?The patient was given information about Chronic Care Management services, agreed to services, and gave verbal consent prior to initiation of services.  Please see initial visit note for detailed documentation.  ? ?Patient agreed to services and verbal consent obtained.  ? ?Assessment: Review of patient past medical history, allergies, medications, health status, including review of consultants reports, laboratory and other test data, was performed as part of comprehensive evaluation and provision of chronic care management services.  ? ?SDOH (Social Determinants of Health) assessments and interventions performed:   ? ?CCM Care Plan ? ?Allergies  ?Allergen Reactions  ? Aspirin Nausea Only  ? Flonase [Fluticasone Propionate]   ?  Make glaucoma worse  ? Codeine Palpitations  ?  Pt vocalized  ? ? ?Outpatient Encounter Medications as of 10/09/2021  ?Medication Sig  ? atorvastatin (LIPITOR) 40 MG tablet Take 1 tablet (40 mg total) by mouth at bedtime.  ? Cholecalciferol 25 MCG (1000 UT) tablet Take 1 tablet (1,000 Units total) by mouth daily.  ? dorzolamide (TRUSOPT) 2 % ophthalmic solution 1 drop 2 (two) times daily.  ? escitalopram (LEXAPRO) 10 MG tablet Take 1 tablet (10 mg total) by mouth daily.  ? folic acid (FOLVITE) 409 MCG tablet Take 400 mcg by mouth daily.  ? hydrochlorothiazide (HYDRODIURIL) 25 MG tablet Take 0.5 tablets (12.5 mg total) by mouth daily.  ? levETIRAcetam (KEPPRA) 250 MG tablet Take 250 mg by mouth at  bedtime.   ? levothyroxine (SYNTHROID) 75 MCG tablet Take 1 tablet (75 mcg total) by mouth daily. Take one tablet every morning  ? losartan (COZAAR) 100 MG tablet Take 1 tablet (100 mg total) by mouth daily.  ? mirtazapine (REMERON) 30 MG tablet Take 1 tablet (30 mg total) by mouth every morning.  ? Multiple Vitamin (MULTIVITAMIN) tablet Take 1 tablet by mouth 3 (three) times a week.   ? pantoprazole (PROTONIX) 40 MG tablet Take 1 tablet (40 mg total) by mouth daily.  ? vitamin B-12 (CYANOCOBALAMIN) 500 MCG tablet Take 500 mcg by mouth daily.  ? ?No facility-administered encounter medications on file as of 10/09/2021.  ? ? ?Patient Active Problem List  ? Diagnosis Date Noted  ? Primary open-angle glaucoma, bilateral, mild stage 08/01/2020  ? History of scarlet fever 08/02/2019  ? CKD (chronic kidney disease) stage 3, GFR 30-59 ml/min (HCC) 11/24/2017  ? Obesity 11/24/2017  ? Reflux esophagitis 10/31/2017  ? History of Helicobacter pylori infection 10/14/2017  ? Iron deficiency anemia 07/28/2017  ? Vitamin D deficiency 03/02/2015  ? Microalbuminuria 03/02/2015  ? Thyroid nodule 03/02/2015  ? Allergic rhinitis   ? Insomnia secondary to anxiety   ? Depression, major, single episode, mild ([place])   ? Epilepsy ([place])   ? Osteopenia   ? Hyperlipemia 11/30/2013  ? Essential hypertension 11/30/2013  ? Hypothyroidism 11/30/2013  ? B12 deficiency 11/30/2013  ? ? ?Conditions to be addressed/monitored:HTN, HLD, and Hypothyroidism ? ? ? ? ? ? ? ?Care Plan : RNCM: General Plan of Care (Adult)  for Chronic Disease Management and Care Coordination Needs  ?Updates made by Vanita Ingles, RN since 10/09/2021 12:00 AM  ?  ? ?Problem: RNCM: Development of plan of care for Chronic Disease Management (HTN, HLD, Hypothyroidism)   ?Priority: High  ?  ? ?Long-Range Goal: RNCM: Effective management  of plan of care for Chronic Disease Management (HTN, HLD, Hypothyroidism)   ?Start Date: 10/09/2021  ?Expected End Date: 10/10/2022  ?Priority: High  ?Note:    ?Current Barriers:  ?Chronic Disease Management support and education needs related to HTN, HLD, and Hypothyroidism ? ?RNCM Clinical Goal(s):  ?Patient will verbalize basic understanding of HTN, HLD, and Hypothyroidism disease process and self health management plan as evidenced by routine office visits, compliance with medications, compliance with the plan of care, adherence to heart healthy diet and working with the CCM team to effectively manage health and well being ?take all medications exactly as prescribed and will call provider for medication related questions as evidenced by compliance with medications and calling for refills before running out of medications    ?attend all scheduled medical appointments: 02-07-2022 at 0800 am as evidenced by keeping appointments and calling for schedule change needs         ?demonstrate improved and ongoing health management independence as evidenced by stable lab work, stable VS, no exacerbations in chronic conditions.        ?demonstrate ongoing self health care management ability for effective management of chronic conditions as evidenced by working with the CCM team through collaboration with Consulting civil engineer, provider, and care team.  ? ?Interventions: ?1:1 collaboration with primary care provider regarding development and update of comprehensive plan of care as evidenced by provider attestation and co-signature ?Inter-disciplinary care team collaboration (see longitudinal plan of care) ?Evaluation of current treatment plan related to  self management and patient's adherence to plan as established by provider ? ? ?Hypothyroidism  (Status: Goal on Track (progressing): YES.) Long Term Goal  ?Evaluation of current treatment plan related to Hypothyroidism,  self-management and patient's adherence to plan as established by provider. ?Discussed plans with patient for ongoing care management follow up and provided patient with direct contact information for care management  team ?Advised patient to discuss changes in hypothyroidism, questions, or concerns; ?Provided education to patient re: taking medications, getting adequate rest, monitoring for changes in condition; ?Reviewed medications with patient and discussed compliance; ?Reviewed scheduled/upcoming provider appointments including 10-11-2021. States she will discuss a new provider as her specialist is retiring.; ?Discussed plans with patient for ongoing care management follow up and provided patient with direct contact information for care management team; ?Advised patient to discuss thyroid health and new concerns  with provider; ?Screening for signs and symptoms of depression related to chronic disease state;  ?Assessed social determinant of health barriers;  ? ?Hyperlipidemia:  (Status: Goal on Track (progressing): YES.) Long Term Goal  ?Lab Results  ?Component Value Date  ? CHOL 166 08/06/2021  ? HDL 70 08/06/2021  ? [place] 76 08/06/2021  ? TRIG 114 08/06/2021  ? CHOLHDL 2.6 07/13/2018  ?  ? ?Medication review performed; medication list updated in electronic medical record.  ?Provider established cholesterol goals reviewed; ?Counseled on importance of regular laboratory monitoring as prescribed; ?Provided HLD educational materials; ?Reviewed role and benefits of statin for ASCVD risk reduction; ?Discussed strategies to manage statin-induced myalgias; ?Reviewed importance of limiting foods high in cholesterol; ?Reviewed exercise goals and target of 150 minutes per week; ? ?Hypertension: (Status: Goal on Track (progressing): YES.)  Long Term Goal  ?Last practice recorded BP readings:  ?BP Readings from Last 3 Encounters:  ?08/06/21 126/78  ?05/08/21 132/77  ?05/04/21 140/78  ?Most recent eGFR/CrCl:  ?Lab Results  ?Component Value Date  ? EGFR 51 (L) 08/06/2021  ?  No components found for: CRCL ? ?Evaluation of current treatment plan related to hypertension self management and patient's adherence to plan as established by  provider;   ?Provided education to patient re: stroke prevention, s/s of heart attack and stroke; ?Reviewed prescribed diet heart healthy  ?Reviewed medications with patient and discussed importance of comp

## 2021-10-09 NOTE — Patient Instructions (Signed)
Visit Information ? ?Thank you for taking time to visit with me today. Please don't hesitate to contact me if I can be of assistance to you before our next scheduled telephone appointment. ? ?Following are the goals we discussed today:  ?RNCM Clinical Goal(s):  ?Patient will verbalize basic understanding of HTN, HLD, and Hypothyroidism disease process and self health management plan as evidenced by routine office visits, compliance with medications, compliance with the plan of care, adherence to heart healthy diet and working with the CCM team to effectively manage health and well being ?take all medications exactly as prescribed and will call provider for medication related questions as evidenced by compliance with medications and calling for refills before running out of medications    ?attend all scheduled medical appointments: 02-07-2022 at 0800 am as evidenced by keeping appointments and calling for schedule change needs         ?demonstrate improved and ongoing health management independence as evidenced by stable lab work, stable VS, no exacerbations in chronic conditions.        ?demonstrate ongoing self health care management ability for effective management of chronic conditions as evidenced by working with the CCM team through collaboration with Consulting civil engineer, provider, and care team.  ?  ?Interventions: ?1:1 collaboration with primary care provider regarding development and update of comprehensive plan of care as evidenced by provider attestation and co-signature ?Inter-disciplinary care team collaboration (see longitudinal plan of care) ?Evaluation of current treatment plan related to  self management and patient's adherence to plan as established by provider ?  ?  ?Hypothyroidism  (Status: Goal on Track (progressing): YES.) Long Term Goal  ?Evaluation of current treatment plan related to Hypothyroidism,  self-management and patient's adherence to plan as established by provider. ?Discussed plans with  patient for ongoing care management follow up and provided patient with direct contact information for care management team ?Advised patient to discuss changes in hypothyroidism, questions, or concerns; ?Provided education to patient re: taking medications, getting adequate rest, monitoring for changes in condition; ?Reviewed medications with patient and discussed compliance; ?Reviewed scheduled/upcoming provider appointments including 10-11-2021. States she will discuss a new provider as her specialist is retiring.; ?Discussed plans with patient for ongoing care management follow up and provided patient with direct contact information for care management team; ?Advised patient to discuss thyroid health and new concerns  with provider; ?Screening for signs and symptoms of depression related to chronic disease state;  ?Assessed social determinant of health barriers;  ?  ?Hyperlipidemia:  (Status: Goal on Track (progressing): YES.) Long Term Goal  ?     ?Lab Results  ?Component Value Date  ?  CHOL 166 08/06/2021  ?  HDL 70 08/06/2021  ?  [place] 76 08/06/2021  ?  TRIG 114 08/06/2021  ?  CHOLHDL 2.6 07/13/2018  ?  ?  ?Medication review performed; medication list updated in electronic medical record.  ?Provider established cholesterol goals reviewed; ?Counseled on importance of regular laboratory monitoring as prescribed; ?Provided HLD educational materials; ?Reviewed role and benefits of statin for ASCVD risk reduction; ?Discussed strategies to manage statin-induced myalgias; ?Reviewed importance of limiting foods high in cholesterol; ?Reviewed exercise goals and target of 150 minutes per week; ?  ?Hypertension: (Status: Goal on Track (progressing): YES.) Long Term Goal  ?Last practice recorded BP readings:  ?   ?BP Readings from Last 3 Encounters:  ?08/06/21 126/78  ?05/08/21 132/77  ?05/04/21 140/78  ?Most recent eGFR/CrCl:  ?     ?Lab Results  ?  Component Value Date  ?  EGFR 51 (L) 08/06/2021  ?  No components found  for: CRCL ?  ?Evaluation of current treatment plan related to hypertension self management and patient's adherence to plan as established by provider;   ?Provided education to patient re: stroke prevention, s/s of heart attack and stroke; ?Reviewed prescribed diet heart healthy  ?Reviewed medications with patient and discussed importance of compliance;  ?Discussed plans with patient for ongoing care management follow up and provided patient with direct contact information for care management team; ?Advised patient, providing education and rationale, to monitor blood pressure daily and record, calling PCP for findings outside established parameters;  ?Reviewed scheduled/upcoming provider appointments including:  ?Advised patient to discuss changes in HTN or heart health with provider; ?Provided education on prescribed diet heart healthy diet ;  ?Discussed complications of poorly controlled blood pressure such as heart disease, stroke, circulatory complications, vision complications, kidney impairment, sexual dysfunction;  ?  ?Patient Goals/Self-Care Activities: ?Take medications as prescribed   ?Attend all scheduled provider appointments ?Call pharmacy for medication refills 3-7 days in advance of running out of medications ?Attend church or other social activities ?Perform all self care activities independently  ?Perform IADL's (shopping, preparing meals, housekeeping, managing finances) independently ?Call provider office for new concerns or questions  ?Work with the Education officer, museum to address care coordination needs and will continue to work with the clinical team to address health care and disease management related needs ?call the Suicide and Crisis Lifeline: 988 ?call the [place] National Suicide Prevention Lifeline: 213-083-7415 or TTY: (980)184-4681 TTY (220)343-0673) to talk to a trained counselor ?call 1-800-273-TALK (toll free, 24 hour hotline) if experiencing a Mental Health or [place]   ?check blood pressure weekly ?choose a place to take my blood pressure (home, clinic or office, retail store) ?write blood pressure results in a log or diary ?learn about high blood pressure ?keep a blood pressure log ?take blood pressure log to all doctor appointments ?call doctor for signs and symptoms of high blood pressure ?develop an action plan for high blood pressure ?keep all doctor appointments ?take medications for blood pressure exactly as prescribed ?report new symptoms to your doctor ?eat more whole grains, fruits and vegetables, lean meats and healthy fats ?- call for medicine refill 2 or 3 days before it runs out ?- take all medications exactly as prescribed ?- call doctor with any symptoms you believe are related to your medicine ?- call doctor when you experience any new symptoms ?- go to all doctor appointments as scheduled ?  ?   ?  ? ? ?Our next appointment is by telephone on 01-22-2022 at 1 pm ? ?Please call the care guide team at [phone] if you need to cancel or reschedule your appointment.  ? ?If you are experiencing a Mental Health or [place] or need someone to talk to, please call the Suicide and Crisis Lifeline: 988 ?call the [place] National Suicide Prevention Lifeline: 2620972467 or TTY: 913-670-3466 TTY (407) 646-8450) to talk to a trained counselor ?call 1-800-273-TALK (toll free, 24 hour hotline)  ? ?Patient verbalizes understanding of instructions and care plan provided today and agrees to view in [place]. Active MyChart status confirmed with patient.   ? ?Noreene Larsson RN, MSN, CCM ?Community Care Coordinator ?[facility] ?[place] ?Mobile: [phone]  ?

## 2021-10-11 ENCOUNTER — Other Ambulatory Visit: Payer: Self-pay

## 2021-10-11 ENCOUNTER — Ambulatory Visit: Payer: PPO | Admitting: Endocrinology

## 2021-10-11 DIAGNOSIS — E039 Hypothyroidism, unspecified: Secondary | ICD-10-CM

## 2021-10-11 MED ORDER — LEVOTHYROXINE SODIUM 75 MCG PO TABS: 75.0000 ug | ORAL_TABLET | Freq: Every day | ORAL | 3 refills | Status: DC

## 2021-10-31 DIAGNOSIS — E039 Hypothyroidism, unspecified: Secondary | ICD-10-CM | POA: Diagnosis not present

## 2021-10-31 DIAGNOSIS — E785 Hyperlipidemia, unspecified: Secondary | ICD-10-CM

## 2021-10-31 DIAGNOSIS — I10 Essential (primary) hypertension: Secondary | ICD-10-CM

## 2021-12-14 ENCOUNTER — Ambulatory Visit (INDEPENDENT_AMBULATORY_CARE_PROVIDER_SITE_OTHER): Payer: PPO

## 2021-12-14 DIAGNOSIS — I10 Essential (primary) hypertension: Secondary | ICD-10-CM

## 2021-12-14 DIAGNOSIS — E039 Hypothyroidism, unspecified: Secondary | ICD-10-CM

## 2021-12-14 DIAGNOSIS — E782 Mixed hyperlipidemia: Secondary | ICD-10-CM

## 2021-12-14 NOTE — Patient Instructions (Signed)
Visit Information  Thank you for taking time to visit with me today. Please don't hesitate to contact me if I can be of assistance to you before our next scheduled telephone appointment.  Following are the goals we discussed today:  Current Barriers: 12-14-2021: Goals met and care plan is being closed  Chronic Disease Management support and education needs related to HTN, HLD, and Hypothyroidism   RNCM Clinical Goal(s):  Patient will verbalize basic understanding of HTN, HLD, and Hypothyroidism disease process and self health management plan as evidenced by routine office visits, compliance with medications, compliance with the plan of care, adherence to heart healthy diet and working with the CCM team to effectively manage health and well being take all medications exactly as prescribed and will call provider for medication related questions as evidenced by compliance with medications and calling for refills before running out of medications    attend all scheduled medical appointments: 02-07-2022 at 0800 am as evidenced by keeping appointments and calling for schedule change needs         demonstrate improved and ongoing health management independence as evidenced by stable lab work, stable VS, no exacerbations in chronic conditions.        demonstrate ongoing self health care management ability for effective management of chronic conditions as evidenced by working with the CCM team through collaboration with Consulting civil engineer, provider, and care team.    Interventions: 1:1 collaboration with primary care provider regarding development and update of comprehensive plan of care as evidenced by provider attestation and co-signature Inter-disciplinary care team collaboration (see longitudinal plan of care) Evaluation of current treatment plan related to  self management and patient's adherence to plan as established by provider     Hypothyroidism  (Status: Goal Met.) Long Term Goal  12-14-2021: Goals  met and care plan is being closed. The patient is stable. No new concerns related to hypothyroidism  Evaluation of current treatment plan related to Hypothyroidism,  self-management and patient's adherence to plan as established by provider. Discussed plans with patient for ongoing care management follow up and provided patient with direct contact information for care management team Advised patient to discuss changes in hypothyroidism, questions, or concerns; Provided education to patient re: taking medications, getting adequate rest, monitoring for changes in condition; Reviewed medications with patient and discussed compliance; Reviewed scheduled/upcoming provider appointments including 10-11-2021. States she will discuss a new provider as her specialist is retiring.; Discussed plans with patient for ongoing care management follow up and provided patient with direct contact information for care management team; Advised patient to discuss thyroid health and new concerns  with provider; Screening for signs and symptoms of depression related to chronic disease state;  Assessed social determinant of health barriers;    Hyperlipidemia:  (Status: Goal Met.) Long Term Goal 12-14-2021: Goals met and care plan is being closed       Lab Results  Component Value Date    CHOL 166 08/06/2021    HDL 70 08/06/2021    [place] 76 08/06/2021    TRIG 114 08/06/2021    CHOLHDL 2.6 07/13/2018      Medication review performed; medication list updated in electronic medical record.  Provider established cholesterol goals reviewed; Counseled on importance of regular laboratory monitoring as prescribed; Provided HLD educational materials; Reviewed role and benefits of statin for ASCVD risk reduction; Discussed strategies to manage statin-induced myalgias; Reviewed importance of limiting foods high in cholesterol; Reviewed exercise goals and target of 150 minutes per  week;   Hypertension: (Status: Goal Met.)  Long Term Goal  12-14-2021: Goals met and care plan is being closed  Last practice recorded BP readings:     BP Readings from Last 3 Encounters:  08/06/21 126/78  05/08/21 132/77  05/04/21 140/78  Most recent eGFR/CrCl:       Lab Results  Component Value Date    EGFR 51 (L) 08/06/2021    No components found for: CRCL   Evaluation of current treatment plan related to hypertension self management and patient's adherence to plan as established by provider;   Provided education to patient re: stroke prevention, s/s of heart attack and stroke; Reviewed prescribed diet heart healthy  Reviewed medications with patient and discussed importance of compliance;  Discussed plans with patient for ongoing care management follow up and provided patient with direct contact information for care management team; Advised patient, providing education and rationale, to monitor blood pressure daily and record, calling PCP for findings outside established parameters;  Reviewed scheduled/upcoming provider appointments including:  Advised patient to discuss changes in HTN or heart health with provider; Provided education on prescribed diet heart healthy diet ;  Discussed complications of poorly controlled blood pressure such as heart disease, stroke, circulatory complications, vision complications, kidney impairment, sexual dysfunction;    Patient Goals/Self-Care Activities: Take medications as prescribed   Attend all scheduled provider appointments Call pharmacy for medication refills 3-7 days in advance of running out of medications Attend church or other social activities Perform all self care activities independently  Perform IADL's (shopping, preparing meals, housekeeping, managing finances) independently Call provider office for new concerns or questions  Work with the social worker to address care coordination needs and will continue to work with the clinical team to address health care and disease  management related needs call the Suicide and Crisis Lifeline: 988 call the [place] National Suicide Prevention Lifeline: 6068815830 or TTY: 708-226-9803 TTY 2508402158) to talk to a trained counselor call 1-800-273-TALK (toll free, 24 hour hotline) if experiencing a Mental Health or [place]  check blood pressure weekly choose a place to take my blood pressure (home, clinic or office, retail store) write blood pressure results in a log or diary learn about high blood pressure keep a blood pressure log take blood pressure log to all doctor appointments call doctor for signs and symptoms of high blood pressure develop an action plan for high blood pressure keep all doctor appointments take medications for blood pressure exactly as prescribed report new symptoms to your doctor eat more whole grains, fruits and vegetables, lean meats and healthy fats - call for medicine refill 2 or 3 days before it runs out - take all medications exactly as prescribed - call doctor with any symptoms you believe are related to your medicine - call doctor when you experience any new symptoms - go to all doctor appointments as scheduled  No further follow up required. The patient has met the goals of care and the care plan is closed. Knows to call the [facility] for new concerns or changes.   Please call the care guide team at [phone] if you need to  schedule an appointment.   If you are experiencing a Mental Health or [place] or need someone to talk to, please call the Suicide and Crisis Lifeline: 988 call the [place] National Suicide Prevention Lifeline: 3611161224 or TTY: (438)138-8435 TTY (615) 364-2440) to talk to a trained counselor call 1-800-273-TALK (toll free, 24 hour hotline)   Patient  verbalizes understanding of instructions and care plan provided today and agrees to view in [place]. Active MyChart status and patient understanding of how to access instructions  and care plan via MyChart confirmed with patient.     Noreene Larsson RN, MSN, [facility] Mobile: [phone]

## 2021-12-14 NOTE — Chronic Care Management (AMB) (Signed)
Chronic Care Management   CCM RN Visit Note  12/14/2021 Name: Rachel Cox MRN: [id number] DOB: Nov 24, 1950  Subjective: Rachel Cox is a 71 y.o. year old female who is a primary care patient of Cannady, Barbaraann Faster, NP. The care management team was consulted for assistance with disease management and care coordination needs.    Engaged with patient by telephone for follow up visit in response to provider referral for case management and/or care coordination services.   Consent to Services:  The patient was given information about Chronic Care Management services, agreed to services, and gave verbal consent prior to initiation of services.  Please see initial visit note for detailed documentation.   Patient agreed to services and verbal consent obtained.   Assessment: Review of patient past medical history, allergies, medications, health status, including review of consultants reports, laboratory and other test data, was performed as part of comprehensive evaluation and provision of chronic care management services.   SDOH (Social Determinants of Health) assessments and interventions performed:    CCM Care Plan  Allergies  Allergen Reactions   Aspirin Nausea Only   Flonase [Fluticasone Propionate]     Make glaucoma worse   Codeine Palpitations    Pt vocalized    Outpatient Encounter Medications as of 12/14/2021  Medication Sig   atorvastatin (LIPITOR) 40 MG tablet Take 1 tablet (40 mg total) by mouth at bedtime.   Cholecalciferol 25 MCG (1000 UT) tablet Take 1 tablet (1,000 Units total) by mouth daily.   dorzolamide (TRUSOPT) 2 % ophthalmic solution 1 drop 2 (two) times daily.   escitalopram (LEXAPRO) 10 MG tablet Take 1 tablet (10 mg total) by mouth daily.   folic acid (FOLVITE) 469 MCG tablet Take 400 mcg by mouth daily.   hydrochlorothiazide (HYDRODIURIL) 25 MG tablet Take 0.5 tablets (12.5 mg total) by mouth daily.   levETIRAcetam (KEPPRA) 250 MG tablet Take 250 mg by mouth  at bedtime.    levothyroxine (SYNTHROID) 75 MCG tablet Take 1 tablet (75 mcg total) by mouth daily. Take one tablet every morning   losartan (COZAAR) 100 MG tablet Take 1 tablet (100 mg total) by mouth daily.   mirtazapine (REMERON) 30 MG tablet Take 1 tablet (30 mg total) by mouth every morning.   Multiple Vitamin (MULTIVITAMIN) tablet Take 1 tablet by mouth 3 (three) times a week.    pantoprazole (PROTONIX) 40 MG tablet Take 1 tablet (40 mg total) by mouth daily.   vitamin B-12 (CYANOCOBALAMIN) 500 MCG tablet Take 500 mcg by mouth daily.   No facility-administered encounter medications on file as of 12/14/2021.    Patient Active Problem List   Diagnosis Date Noted   Primary open-angle glaucoma, bilateral, mild stage 08/01/2020   History of scarlet fever 08/02/2019   CKD (chronic kidney disease) stage 3, GFR 30-59 ml/min (HCC) 11/24/2017   Obesity 11/24/2017   Reflux esophagitis 62/95/2841   History of Helicobacter pylori infection 10/14/2017   Iron deficiency anemia 07/28/2017   Vitamin D deficiency 03/02/2015   Microalbuminuria 03/02/2015   Thyroid nodule 03/02/2015   Allergic rhinitis    Insomnia secondary to anxiety    Depression, major, single episode, mild (HCC)    Epilepsy ([place])    Osteopenia    Hyperlipemia 11/30/2013   Essential hypertension 11/30/2013   Hypothyroidism 11/30/2013   B12 deficiency 11/30/2013    Conditions to be addressed/monitored:HTN, HLD, and Hypothyroidism  Care Plan : RNCM: General Plan of Care (Adult) for Chronic Disease Management and Care  Coordination Needs  Updates made by Vanita Ingles, RN since 12/14/2021 12:00 AM  Completed 12/14/2021   Problem: RNCM: Development of plan of care for Chronic Disease Management (HTN, HLD, Hypothyroidism) Resolved 12/14/2021  Priority: High     Long-Range Goal: RNCM: Effective management  of plan of care for Chronic Disease Management (HTN, HLD, Hypothyroidism) Completed 12/14/2021  Start Date: 10/09/2021   Expected End Date: 10/10/2022  Priority: High  Note:   Current Barriers: 12-14-2021: Goals met and care plan is being closed  Chronic Disease Management support and education needs related to HTN, HLD, and Hypothyroidism  RNCM Clinical Goal(s):  Patient will verbalize basic understanding of HTN, HLD, and Hypothyroidism disease process and self health management plan as evidenced by routine office visits, compliance with medications, compliance with the plan of care, adherence to heart healthy diet and working with the CCM team to effectively manage health and well being take all medications exactly as prescribed and will call provider for medication related questions as evidenced by compliance with medications and calling for refills before running out of medications    attend all scheduled medical appointments: 02-07-2022 at 0800 am as evidenced by keeping appointments and calling for schedule change needs         demonstrate improved and ongoing health management independence as evidenced by stable lab work, stable VS, no exacerbations in chronic conditions.        demonstrate ongoing self health care management ability for effective management of chronic conditions as evidenced by working with the CCM team through collaboration with Consulting civil engineer, provider, and care team.   Interventions: 1:1 collaboration with primary care provider regarding development and update of comprehensive plan of care as evidenced by provider attestation and co-signature Inter-disciplinary care team collaboration (see longitudinal plan of care) Evaluation of current treatment plan related to  self management and patient's adherence to plan as established by provider   Hypothyroidism  (Status: Goal Met.) Long Term Goal  12-14-2021: Goals met and care plan is being closed. The patient is stable. No new concerns related to hypothyroidism  Evaluation of current treatment plan related to Hypothyroidism,  self-management and  patient's adherence to plan as established by provider. Discussed plans with patient for ongoing care management follow up and provided patient with direct contact information for care management team Advised patient to discuss changes in hypothyroidism, questions, or concerns; Provided education to patient re: taking medications, getting adequate rest, monitoring for changes in condition; Reviewed medications with patient and discussed compliance; Reviewed scheduled/upcoming provider appointments including 10-11-2021. States she will discuss a new provider as her specialist is retiring.; Discussed plans with patient for ongoing care management follow up and provided patient with direct contact information for care management team; Advised patient to discuss thyroid health and new concerns  with provider; Screening for signs and symptoms of depression related to chronic disease state;  Assessed social determinant of health barriers;   Hyperlipidemia:  (Status: Goal Met.) Long Term Goal 12-14-2021: Goals met and care plan is being closed  Lab Results  Component Value Date   CHOL 166 08/06/2021   HDL 70 08/06/2021   LDLCALC 76 08/06/2021   TRIG 114 08/06/2021   CHOLHDL 2.6 07/13/2018     Medication review performed; medication list updated in electronic medical record.  Provider established cholesterol goals reviewed; Counseled on importance of regular laboratory monitoring as prescribed; Provided HLD educational materials; Reviewed role and benefits of statin for ASCVD risk reduction; Discussed strategies to  manage statin-induced myalgias; Reviewed importance of limiting foods high in cholesterol; Reviewed exercise goals and target of 150 minutes per week;  Hypertension: (Status: Goal Met.) Long Term Goal  12-14-2021: Goals met and care plan is being closed  Last practice recorded BP readings:  BP Readings from Last 3 Encounters:  08/06/21 126/78  05/08/21 132/77  05/04/21 140/78   Most recent eGFR/CrCl:  Lab Results  Component Value Date   EGFR 51 (L) 08/06/2021    No components found for: CRCL  Evaluation of current treatment plan related to hypertension self management and patient's adherence to plan as established by provider;   Provided education to patient re: stroke prevention, s/s of heart attack and stroke; Reviewed prescribed diet heart healthy  Reviewed medications with patient and discussed importance of compliance;  Discussed plans with patient for ongoing care management follow up and provided patient with direct contact information for care management team; Advised patient, providing education and rationale, to monitor blood pressure daily and record, calling PCP for findings outside established parameters;  Reviewed scheduled/upcoming provider appointments including:  Advised patient to discuss changes in HTN or heart health with provider; Provided education on prescribed diet heart healthy diet ;  Discussed complications of poorly controlled blood pressure such as heart disease, stroke, circulatory complications, vision complications, kidney impairment, sexual dysfunction;   Patient Goals/Self-Care Activities: Take medications as prescribed   Attend all scheduled provider appointments Call pharmacy for medication refills 3-7 days in advance of running out of medications Attend church or other social activities Perform all self care activities independently  Perform IADL's (shopping, preparing meals, housekeeping, managing finances) independently Call provider office for new concerns or questions  Work with the social worker to address care coordination needs and will continue to work with the clinical team to address health care and disease management related needs call the Suicide and Crisis Lifeline: 988 call the [place] National Suicide Prevention Lifeline: 9170791165 or TTY: 3517466307 TTY 585-765-5648) to talk to a trained counselor call  1-800-273-TALK (toll free, 24 hour hotline) if experiencing a Mental Health or [place]  check blood pressure weekly choose a place to take my blood pressure (home, clinic or office, retail store) write blood pressure results in a log or diary learn about high blood pressure keep a blood pressure log take blood pressure log to all doctor appointments call doctor for signs and symptoms of high blood pressure develop an action plan for high blood pressure keep all doctor appointments take medications for blood pressure exactly as prescribed report new symptoms to your doctor eat more whole grains, fruits and vegetables, lean meats and healthy fats - call for medicine refill 2 or 3 days before it runs out - take all medications exactly as prescribed - call doctor with any symptoms you believe are related to your medicine - call doctor when you experience any new symptoms - go to all doctor appointments as scheduled       Plan:No further follow up required: the patient has met the goals of care and care plan is being closed. Knows to call the [facility] for new concerns or needs.   Noreene Larsson RN, MSN, [facility] Mobile: [phone]

## 2021-12-27 ENCOUNTER — Other Ambulatory Visit: Payer: PPO

## 2021-12-31 ENCOUNTER — Other Ambulatory Visit (INDEPENDENT_AMBULATORY_CARE_PROVIDER_SITE_OTHER): Payer: PPO

## 2021-12-31 ENCOUNTER — Other Ambulatory Visit: Payer: Self-pay | Admitting: Endocrinology

## 2021-12-31 DIAGNOSIS — I10 Essential (primary) hypertension: Secondary | ICD-10-CM | POA: Diagnosis not present

## 2021-12-31 DIAGNOSIS — E039 Hypothyroidism, unspecified: Secondary | ICD-10-CM

## 2021-12-31 DIAGNOSIS — E782 Mixed hyperlipidemia: Secondary | ICD-10-CM

## 2021-12-31 LAB — TSH: TSH: 1.46 u[IU]/mL (ref 0.35–5.50)

## 2021-12-31 LAB — T4, FREE: Free T4: 1.24 ng/dL (ref 0.60–1.60)

## 2022-01-01 NOTE — Progress Notes (Unsigned)
Patient ID: Rachel Cox, female   DOB: 1950/11/08, 71 y.o.   MRN: [id number]  Reason for Appointment:  Hypothyroidism, followup visit    History of Present Illness:   HYPOTHYROIDISM was first diagnosed in 2008   She does not remember what symptoms she had when she was diagnosed, was having issues with seizures and may have been diagnosed incidentally Also does not think she felt any change in her energy level with starting levothyroxine  The patient has been treated with levothyroxine and the dose has been the same at 75 mcg daily since 2016 The last visit was a year ago   Patient has no complaints of unusual fatigue, cold sensitivity, dry skin, unusual weight gain or hair loss.            The patient is taking the thyroid supplement very regularly in the morning before breakfast.  Not taking any calcium or iron supplements with the thyroid supplement.   She has not missed any doses  Lab on 12/31/2021  Component Date Value Ref Range Status   TSH 12/31/2021 1.46  0.35 - 5.50 uIU/mL Final   Free T4 12/31/2021 1.24  0.60 - 1.60 ng/dL Final   Comment: Specimens from patients who are undergoing biotin therapy and /or ingesting biotin supplements may contain high levels of biotin.  The higher biotin concentration in these specimens interferes with this Free T4 assay.  Specimens that contain high levels  of biotin may cause false high results for this Free T4 assay.  Please interpret results in light of the total clinical presentation of the patient.      Allergies as of 01/02/2022       Reactions   Aspirin Nausea Only   Flonase [fluticasone Propionate]    Make glaucoma worse   Codeine Palpitations   Pt vocalized        Medication List        Accurate as of January 02, 2022  3:49 PM. If you have any questions, ask your nurse or doctor.          atorvastatin 40 MG tablet Commonly known as: LIPITOR Take 1 tablet (40 mg total) by mouth at bedtime.   Cholecalciferol 25 MCG  (1000 UT) tablet Take 1 tablet (1,000 Units total) by mouth daily.   cyanocobalamin 500 MCG tablet Commonly known as: VITAMIN B12 Take 500 mcg by mouth daily.   dorzolamide 2 % ophthalmic solution Commonly known as: TRUSOPT 1 drop 2 (two) times daily.   escitalopram 10 MG tablet Commonly known as: LEXAPRO Take 1 tablet (10 mg total) by mouth daily.   folic acid 423 MCG tablet Commonly known as: FOLVITE Take 400 mcg by mouth daily.   hydrochlorothiazide 25 MG tablet Commonly known as: HYDRODIURIL Take 0.5 tablets (12.5 mg total) by mouth daily.   levETIRAcetam 250 MG tablet Commonly known as: KEPPRA Take 250 mg by mouth at bedtime.   levothyroxine 75 MCG tablet Commonly known as: SYNTHROID Take 1 tablet (75 mcg total) by mouth daily. Take one tablet every morning   losartan 100 MG tablet Commonly known as: COZAAR Take 1 tablet (100 mg total) by mouth daily.   mirtazapine 30 MG tablet Commonly known as: REMERON Take 1 tablet (30 mg total) by mouth every morning.   multivitamin tablet Take 1 tablet by mouth 3 (three) times a week.   pantoprazole 40 MG tablet Commonly known as: PROTONIX Take 1 tablet (40 mg total) by mouth daily.  Allergies:  Allergies  Allergen Reactions   Aspirin Nausea Only   Flonase [Fluticasone Propionate]     Make glaucoma worse   Codeine Palpitations    Pt vocalized    Past Medical History:  Diagnosis Date   Allergic rhinitis    Allergy    Anemia    Anxiety    Depression    Epilepsy ([place])    managed by Dr. Rexene Alberts 2015/2016   GERD (gastroesophageal reflux disease)    History of abnormal cervical Pap smear 1980's   Hyperglycemia    Hyperlipidemia    Hypertension    Hypothyroidism    Insomnia    Obesity (BMI 30.0-34.9) 01/06/2017   Osteopenia    Reflux esophagitis 10/31/2017   EGD    Past Surgical History:  Procedure Laterality Date   COLONOSCOPY  Sept 2015   COLONOSCOPY WITH PROPOFOL N/A 09/10/2017    Procedure: COLONOSCOPY WITH PROPOFOL;  Surgeon: Robert Bellow, MD;  Location: [facility] ENDOSCOPY;  Service: Endoscopy;  Laterality: N/A;   Cryo Procedure  1980s   for abnormal pap   ESOPHAGOGASTRODUODENOSCOPY (EGD) WITH PROPOFOL N/A 09/10/2017   Procedure: ESOPHAGOGASTRODUODENOSCOPY (EGD) WITH PROPOFOL;  Surgeon: Robert Bellow, MD;  Location: [facility] ENDOSCOPY;  Service: Endoscopy;  Laterality: N/A;   ESOPHAGOGASTRODUODENOSCOPY (EGD) WITH PROPOFOL N/A 10/29/2017   Procedure: ESOPHAGOGASTRODUODENOSCOPY (EGD) WITH PROPOFOL;  Surgeon: Robert Bellow, MD;  Location: [facility] ENDOSCOPY;  Service: Endoscopy;  Laterality: N/A;   EYE SURGERY Bilateral 2021   cataract removal   LIPOMA EXCISION N/A 12/12/2017   Procedure: EXCISION BACK LIPOMA;  Surgeon: Robert Bellow, MD;  Location: [facility] ORS;  Service: General;  Laterality: N/A;   TUBAL LIGATION  1969    Family History  Problem Relation Age of Onset   Hyperlipidemia Mother    Hypertension Mother    CAD Mother    Thyroid disease Mother    Heart disease Mother    COPD Father    Thyroid disease Father    Cancer Brother        liver   Alcohol abuse Brother    Hypertension Brother    Liver disease Brother    Thyroid disease Sister    Diabetes Neg Hx    Stroke Neg Hx     Social History:  reports that she has never smoked. She has never used smokeless tobacco. She reports current alcohol use. She reports that she does not use drugs.  Review of Systems     Examination:   BP (!) 142/82   Pulse 81   Ht '5\' 4"'$  (1.626 m)   Wt 188 lb (85.3 kg)   SpO2 95%   BMI 32.27 kg/m   GENERAL APPEARANCE: Alert, no puffiness of the face or eyes  NECK: Thyroid is not palpable           NEUROLOGIC EXAM:  biceps reflexes show normal relaxation Skin: Not unusual dry    Assessment   Hypothyroidism, primary without a goiter She is very consistent with taking her levothyroxine daily an hour before breakfast   Treatment:   Continue same  dosage before breakfast daily.  Avoid taking any calcium or iron supplements with the thyroid supplement.   Discussed that she can have breakfast or coffee about 30 minutes after levothyroxine and does not have to wait 60 minutes Follow-up in 1 year  Elayne Snare 01/02/2022, 3:49 PM   Note: This office note was prepared with Dragon voice recognition system technology. Any transcriptional errors that result  from this process are unintentional.

## 2022-01-02 ENCOUNTER — Encounter: Payer: Self-pay | Admitting: Endocrinology

## 2022-01-02 ENCOUNTER — Ambulatory Visit (INDEPENDENT_AMBULATORY_CARE_PROVIDER_SITE_OTHER): Payer: PPO | Admitting: Endocrinology

## 2022-01-02 VITALS — BP 142/82 | HR 81 | Ht 64.0 in | Wt 188.0 lb

## 2022-01-02 DIAGNOSIS — E039 Hypothyroidism, unspecified: Secondary | ICD-10-CM | POA: Diagnosis not present

## 2022-01-22 ENCOUNTER — Telehealth: Payer: PPO

## 2022-01-25 ENCOUNTER — Encounter: Payer: Self-pay | Admitting: Nurse Practitioner

## 2022-01-31 ENCOUNTER — Encounter: Payer: Self-pay | Admitting: Oncology

## 2022-02-03 NOTE — Patient Instructions (Signed)
Osteopenia  Osteopenia is a loss of thickness (density) inside the bones. Another name for osteopenia is low bone mass. Mild osteopenia is a normal part of aging. It is not a disease, and it does not cause symptoms. However, if you have osteopenia and continue to lose bone mass, you could develop a condition that causes the bones to become thin and break more easily (osteoporosis). Osteoporosis can cause you to lose some height, have back pain, and have a stooped posture. Although osteopenia is not a disease, making changes to your lifestyle and diet can help to prevent osteopenia from developing into osteoporosis. What are the causes? Osteopenia is caused by loss of calcium in the bones. Bones are constantly changing. Old bone cells are continually being replaced with new bone cells. This process builds new bone. The mineral calcium is needed to build new bone and maintain bone density. Bone density is usually highest around age 35. After that, most people's bodies cannot replace all the bone they have lost with new bone. What increases the risk? You are more likely to develop this condition if: You are older than age 50. You are a woman who went through menopause early. You have a long illness that keeps you in bed. You do not get enough exercise. You lack certain nutrients (malnutrition). You have an overactive thyroid gland (hyperthyroidism). You use products that contain nicotine or tobacco, such as cigarettes, e-cigarettes and chewing tobacco, or you drink a lot of alcohol. You are taking medicines that weaken the bones, such as steroids. What are the signs or symptoms? This condition does not cause any symptoms. You may have a slightly higher risk for bone breaks (fractures), so getting fractures more easily than normal may be an indication of osteopenia. How is this diagnosed? This condition may be diagnosed based on an X-ray exam that measures bone density (dual-energy X-ray  absorptiometry, or DEXA). This test can measure bone density in your hips, spine, and wrists. Osteopenia has no symptoms, so this condition is usually diagnosed after a routine bone density screening test is done for osteoporosis. This routine screening is usually done for: Women who are age 65 or older. Men who are age 70 or older. If you have risk factors for osteopenia, you may have the screening test at an earlier age. How is this treated? Making dietary and lifestyle changes can lower your risk for osteoporosis. If you have severe osteopenia that is close to becoming osteoporosis, this condition can be treated with medicines and dietary supplements such as calcium and vitamin D. These supplements help to rebuild bone density. Follow these instructions at home: Eating and drinking Eat a diet that is high in calcium and vitamin D. Calcium is found in dairy products, beans, salmon, and leafy green vegetables like spinach and broccoli. Look for foods that have vitamin D and calcium added to them (fortified foods), such as orange juice, cereal, and bread.  Lifestyle Do 30 minutes or more of a weight-bearing exercise every day, such as walking, jogging, or playing a sport. These types of exercises strengthen the bones. Do not use any products that contain nicotine or tobacco, such as cigarettes, e-cigarettes, and chewing tobacco. If you need help quitting, ask your health care provider. Do not drink alcohol if: Your health care provider tells you not to drink. You are pregnant, may be pregnant, or are planning to become pregnant. If you drink alcohol: Limit how much you use to: 0-1 drink a day for women. 0-2   drinks a day for men. Be aware of how much alcohol is in your drink. In the [place] one drink equals one 12 oz bottle of beer (355 mL), one 5 oz glass of wine (148 mL), or one 1 oz glass of hard liquor (44 mL). General instructions Take over-the-counter and prescription medicines only as  told by your health care provider. These include vitamins and supplements. Take precautions at home to lower your risk of falling, such as: Keeping rooms well-lit and free of clutter, such as cords. Installing safety rails on stairs. Using rubber mats in the bathroom or other areas that are often wet or slippery. Keep all follow-up visits. This is important. Contact a health care provider if: You have not had a bone density screening for osteoporosis and you are: A woman who is age 65 or older. A man who is age 70 or older. You are a postmenopausal woman who has not had a bone density screening for osteoporosis. You are older than age 50 and you want to know if you should have bone density screening for osteoporosis. Summary Osteopenia is a loss of thickness (density) inside the bones. Another name for osteopenia is low bone mass. Osteopenia is not a disease, but it may increase your risk for a condition that causes the bones to become thin and break more easily (osteoporosis). You may be at risk for osteopenia if you are older than age 50 or if you are a woman who went through early menopause. Osteopenia does not cause any symptoms, but it can be diagnosed with a bone density screening test. Dietary and lifestyle changes are the first treatment for osteopenia. These may lower your risk for osteoporosis. This information is not intended to replace advice given to you by your health care provider. Make sure you discuss any questions you have with your health care provider. Document Revised: 11/04/2019 Document Reviewed: 11/04/2019 Elsevier Patient Education  2023 Elsevier Inc.  

## 2022-02-07 ENCOUNTER — Ambulatory Visit (INDEPENDENT_AMBULATORY_CARE_PROVIDER_SITE_OTHER): Payer: PPO | Admitting: Nurse Practitioner

## 2022-02-07 ENCOUNTER — Encounter: Payer: Self-pay | Admitting: Nurse Practitioner

## 2022-02-07 VITALS — BP 136/82 | HR 66 | Temp 98.0°F | Ht 64.0 in | Wt 190.7 lb

## 2022-02-07 DIAGNOSIS — E538 Deficiency of other specified B group vitamins: Secondary | ICD-10-CM

## 2022-02-07 DIAGNOSIS — K2100 Gastro-esophageal reflux disease with esophagitis, without bleeding: Secondary | ICD-10-CM

## 2022-02-07 DIAGNOSIS — Z6832 Body mass index (BMI) 32.0-32.9, adult: Secondary | ICD-10-CM

## 2022-02-07 DIAGNOSIS — E039 Hypothyroidism, unspecified: Secondary | ICD-10-CM

## 2022-02-07 DIAGNOSIS — E041 Nontoxic single thyroid nodule: Secondary | ICD-10-CM

## 2022-02-07 DIAGNOSIS — M8588 Other specified disorders of bone density and structure, other site: Secondary | ICD-10-CM | POA: Diagnosis not present

## 2022-02-07 DIAGNOSIS — R809 Proteinuria, unspecified: Secondary | ICD-10-CM

## 2022-02-07 DIAGNOSIS — I10 Essential (primary) hypertension: Secondary | ICD-10-CM | POA: Diagnosis not present

## 2022-02-07 DIAGNOSIS — N1831 Chronic kidney disease, stage 3a: Secondary | ICD-10-CM

## 2022-02-07 DIAGNOSIS — B351 Tinea unguium: Secondary | ICD-10-CM | POA: Diagnosis not present

## 2022-02-07 DIAGNOSIS — G40802 Other epilepsy, not intractable, without status epilepticus: Secondary | ICD-10-CM | POA: Diagnosis not present

## 2022-02-07 DIAGNOSIS — D508 Other iron deficiency anemias: Secondary | ICD-10-CM | POA: Diagnosis not present

## 2022-02-07 DIAGNOSIS — Z0000 Encounter for general adult medical examination without abnormal findings: Secondary | ICD-10-CM

## 2022-02-07 DIAGNOSIS — F419 Anxiety disorder, unspecified: Secondary | ICD-10-CM | POA: Diagnosis not present

## 2022-02-07 DIAGNOSIS — E559 Vitamin D deficiency, unspecified: Secondary | ICD-10-CM

## 2022-02-07 DIAGNOSIS — H401131 Primary open-angle glaucoma, bilateral, mild stage: Secondary | ICD-10-CM

## 2022-02-07 DIAGNOSIS — E6609 Other obesity due to excess calories: Secondary | ICD-10-CM

## 2022-02-07 DIAGNOSIS — E782 Mixed hyperlipidemia: Secondary | ICD-10-CM

## 2022-02-07 DIAGNOSIS — F5105 Insomnia due to other mental disorder: Secondary | ICD-10-CM

## 2022-02-07 DIAGNOSIS — F320 Major depressive disorder, single episode, mild: Secondary | ICD-10-CM

## 2022-02-07 MED ORDER — ATORVASTATIN CALCIUM 40 MG PO TABS
40.0000 mg | ORAL_TABLET | Freq: Every day | ORAL | 4 refills | Status: DC
Start: 2022-02-07 — End: 2023-02-12

## 2022-02-07 MED ORDER — CICLOPIROX 8 % EX SOLN: Freq: Every day | CUTANEOUS | 3 refills | Status: DC

## 2022-02-07 MED ORDER — HYDROCHLOROTHIAZIDE 25 MG PO TABS: 12.5000 mg | ORAL_TABLET | Freq: Every day | ORAL | 4 refills | Status: DC

## 2022-02-07 MED ORDER — LOSARTAN POTASSIUM 100 MG PO TABS
100.0000 mg | ORAL_TABLET | Freq: Every day | ORAL | 4 refills | Status: DC
Start: 2022-02-07 — End: 2023-02-12

## 2022-02-07 NOTE — Assessment & Plan Note (Signed)
Chronic, ongoing.  Continue current medication regimen at this time and adjust as needed.  Discussed the risks and benefits of long term PPI use including but not limited to bone loss, chronic kidney disease, infections, low magnesium.  Will aim to use at the lowest dose for the shortest period of time.  Mag level today.

## 2022-02-07 NOTE — Assessment & Plan Note (Signed)
Chronic, ongoing.  Continue current medication regimen, Remeron and Lexapro, and adjust as needed -- currently followed by neurology for this.  Denies SI/HI.  Return in 6 months for follow-up. 

## 2022-02-07 NOTE — Assessment & Plan Note (Signed)
Chronic, stable.  Avoid NSAIDs.  Continue Losartan for kidney protection.  Labs: CMP today.  Consider nephrology referral if any decline.

## 2022-02-07 NOTE — Assessment & Plan Note (Signed)
BMI 32.73.  Recommended eating smaller high protein, low fat meals more frequently and exercising 30 mins a day 5 times a week with a goal of 10-15lb weight loss in the next 3 months. Patient voiced their understanding and motivation to adhere to these recommendations.

## 2022-02-07 NOTE — Assessment & Plan Note (Signed)
Chronic, ongoing.  Continue daily supplement and adjust as needed.  Last DEXA 2020.  Check Vit D level. 

## 2022-02-07 NOTE — Assessment & Plan Note (Signed)
Chronic, stable.  Continue current medication regimen as recommended by neuro and collaboration with them.  CMP today, Keppra level up to date.  Recent neuro note reviewed.

## 2022-02-07 NOTE — Assessment & Plan Note (Signed)
Chronic, ongoing.  Continue current medication regimen as recommended by endocrinology and collaboration with them.  Recent notes and labs reviewed.

## 2022-02-07 NOTE — Progress Notes (Signed)
BP 136/82   Pulse 66   Temp 98 F (36.7 C) (Oral)   Ht '5\' 4"'$  (1.626 m)   Wt 190 lb 11.2 oz (86.5 kg)   SpO2 94%   BMI 32.73 kg/m    Subjective:    Patient ID: Rachel Cox, female    DOB: September 19, 1950, 71 y.o.   MRN: [id number]  HPI: Rachel Cox is a 71 y.o. female presenting on 02/07/2022 for comprehensive medical examination. Current medical complaints include:none  She currently lives with: husband Menopausal Symptoms: no   SEIZURE DISORDER Followed by neurology and last saw 05/07/21.  Has had no seizure since 2016. Keppra 250 MG at bedtime, which was refilled by them.  They have her on Remeron for sleep and continues Lexapro for mood.  This overall helps with her mood and sleep pattern.  HYPERTENSION / HYPERLIPIDEMIA Continues on Losartan, HCTZ and Atorvastatin.   Satisfied with current treatment? yes Duration of hypertension: chronic BP monitoring frequency: once a week BP range: 130/70-80 range sometimes a little lower BP medication side effects: yes Duration of hyperlipidemia: chronic Cholesterol medication side effects: no Cholesterol supplements: none Medication compliance: good compliance Aspirin: no Recent stressors: no Recurrent headaches: no Visual changes: no Palpitations: no Dyspnea: no Chest pain: no Lower extremity edema: no Dizzy/lightheaded: no  The 10-year ASCVD risk score (Arnett DK, et al., 2019) is: 18.4%   Values used to calculate the score:     Age: 18 years     Sex: Female     Is Non-Hispanic African American: No     Diabetic: No     Tobacco smoker: No     Systolic Blood Pressure: 893 mmHg     Is BP treated: Yes     HDL Cholesterol: 70 mg/dL     Total Cholesterol: 166 mg/dL   GERD Continues on Protonix. GERD control status: stable Satisfied with current treatment? yes Heartburn frequency: none Medication side effects: no  Medication compliance: stable Dysphagia: no Odynophagia:  no Hematemesis: no Blood in stool: no EGD: yes     ANEMIA Continues on supplements -- folic acid.   Anemia status: stable Etiology of anemia: Duration of anemia treatment:  Compliance with treatment: good compliance Iron supplementation side effects: no Severity of anemia: mild Fatigue: no Decreased exercise tolerance: no  Dyspnea on exertion: no Palpitations: no Bleeding: no Pica: no    CHRONIC KIDNEY DISEASE Recent kidney levels were stable.  Drinks lots of water at home and has cut out sodas.  Has underlying osteopenia noted on DEXA September 2020, continues on daily supplements. CKD status: stable Medications renally dose: yes Previous renal evaluation: no Pneumovax:  Up to Date Influenza Vaccine:  Up to Date    HYPOTHYROIDISM Last TSH 1.46 -- followed by Dr. Dwyane Dee with endo and last saw 01/02/22. Thyroid control status:stable Satisfied with current treatment? yes Medication side effects: no Medication compliance: good compliance Etiology of hypothyroidism:  Recent dose adjustment:no Fatigue: no Cold intolerance: no Heat intolerance: no Weight gain: no Weight loss: no Constipation: no Diarrhea/loose stools: no Palpitations: no Lower extremity edema: no Anxiety/depressed mood: no    ANXIETY/STRESS Duration:stable Anxious mood: no  Excessive worrying: no Irritability: no  Sweating: no Nausea: no Palpitations:no Hyperventilation: no Panic attacks: no Agoraphobia: no  Obscessions/compulsions: no Depressed mood: no    02/07/2022    8:05 AM 08/06/2021    8:23 AM 08/06/2021    8:22 AM 07/09/2021    8:20 AM 05/08/2021    8:46  AM  Depression screen PHQ 2/9  Decreased Interest 0 0 0 0 0  Down, Depressed, Hopeless 0 0 0 0 0  PHQ - 2 Score 0 0 0 0 0  Altered sleeping 0 0   0  Tired, decreased energy 0 0   0  Change in appetite 0 0   0  Feeling bad or failure about yourself  0 0   0  Trouble concentrating 0 0   0  Moving slowly or fidgety/restless 0 0   0  Suicidal thoughts 0 0   0  PHQ-9 Score 0 0   0   Difficult doing work/chores Not difficult at all Not difficult at all   Not difficult at all      02/07/2022    8:05 AM 08/06/2021    8:23 AM 02/02/2020    8:21 AM 08/02/2019    8:16 AM  GAD 7 : Generalized Anxiety Score  Nervous, Anxious, on Edge 0 0 0 1  Control/stop worrying 0 0 0 0  Worry too much - different things 0 0 0 0  Trouble relaxing 0 0 0 0  Restless 0 0 0 0  Easily annoyed or irritable 0 0 0 1  Afraid - awful might happen 0 0 0 0  Total GAD 7 Score 0 0 0 2  Anxiety Difficulty Not difficult at all Not difficult at all Not difficult at all Not difficult at all     Functional Status Survey: Is the patient deaf or have difficulty hearing?: No Does the patient have difficulty seeing, even when wearing glasses/contacts?: No Does the patient have difficulty concentrating, remembering, or making decisions?: No Does the patient have difficulty walking or climbing stairs?: No Does the patient have difficulty dressing or bathing?: No Does the patient have difficulty doing errands alone such as visiting a doctor's office or shopping?: No      02/07/2022    8:05 AM 07/09/2021    8:21 AM 02/06/2021    8:44 AM 08/01/2020    8:30 AM 07/07/2020    8:18 AM  Fall Risk   Falls in the past year? 0 0 0 0 0  Number falls in past yr: 0 0 0    Injury with Fall? 0 0 0 0   Risk for fall due to : No Fall Risks  No Fall Risks  Medication side effect  Follow up Falls evaluation completed Falls evaluation completed;Falls prevention discussed Falls prevention discussed  Falls evaluation completed;Education provided;Falls prevention discussed    Past Medical History:  Past Medical History:  Diagnosis Date   Allergic rhinitis    Allergy    Anemia    Anxiety    Depression    Epilepsy ([place])    managed by Dr. Rexene Alberts 2015/2016   GERD (gastroesophageal reflux disease)    History of abnormal cervical Pap smear 1980's   Hyperglycemia    Hyperlipidemia    Hypertension    Hypothyroidism     Insomnia    Obesity (BMI 30.0-34.9) 01/06/2017   Osteopenia    Reflux esophagitis 10/31/2017   EGD    Surgical History:  Past Surgical History:  Procedure Laterality Date   COLONOSCOPY  Sept 2015   COLONOSCOPY WITH PROPOFOL N/A 09/10/2017   Procedure: COLONOSCOPY WITH PROPOFOL;  Surgeon: Robert Bellow, MD;  Location: [facility] END[facility];  Service: Endoscopy;  Laterality: N/A;   Cryo Procedure  1980s   for abnormal pap   ESOPHAGOGASTRODUODENOSCOPY (EGD) WITH PROPOFOL N/A 09/10/2017  Procedure: ESOPHAGOGASTRODUODENOSCOPY (EGD) WITH PROPOFOL;  Surgeon: Robert Bellow, MD;  Location: [facility] ENDOSCOPY;  Service: Endoscopy;  Laterality: N/A;   ESOPHAGOGASTRODUODENOSCOPY (EGD) WITH PROPOFOL N/A 10/29/2017   Procedure: ESOPHAGOGASTRODUODENOSCOPY (EGD) WITH PROPOFOL;  Surgeon: Robert Bellow, MD;  Location: [facility] ENDOSCOPY;  Service: Endoscopy;  Laterality: N/A;   EYE SURGERY Bilateral 2021   cataract removal   LIPOMA EXCISION N/A 12/12/2017   Procedure: EXCISION BACK LIPOMA;  Surgeon: Robert Bellow, MD;  Location: [facility] ORS;  Service: General;  Laterality: N/A;   TUBAL LIGATION  1969    Medications:  Current Outpatient Medications on File Prior to Visit  Medication Sig   Cholecalciferol 25 MCG (1000 UT) tablet Take 1 tablet (1,000 Units total) by mouth daily.   dorzolamide (TRUSOPT) 2 % ophthalmic solution 1 drop 2 (two) times daily.   escitalopram (LEXAPRO) 10 MG tablet Take 1 tablet (10 mg total) by mouth daily.   folic acid (FOLVITE) 443 MCG tablet Take 400 mcg by mouth daily.   levETIRAcetam (KEPPRA) 250 MG tablet Take 250 mg by mouth at bedtime.    levothyroxine (SYNTHROID) 75 MCG tablet Take 1 tablet (75 mcg total) by mouth daily. Take one tablet every morning   mirtazapine (REMERON) 30 MG tablet Take 1 tablet (30 mg total) by mouth every morning.   Multiple Vitamin (MULTIVITAMIN) tablet Take 1 tablet by mouth 3 (three) times a week.    pantoprazole (PROTONIX) 40 MG tablet Take  1 tablet (40 mg total) by mouth daily.   vitamin B-12 (CYANOCOBALAMIN) 500 MCG tablet Take 500 mcg by mouth daily.   No current facility-administered medications on file prior to visit.    Allergies:  Allergies  Allergen Reactions   Aspirin Nausea Only   Flonase [Fluticasone Propionate]     Make glaucoma worse   Codeine Palpitations    Pt vocalized    Social History:  Social History   Socioeconomic History   Marital status: Married    Spouse name: Girard Cooter   Number of children: 4   Years of education: Not on file   Highest education level: 10th grade  Occupational History   Occupation: Retired  Tobacco Use   Smoking status: Never   Smokeless tobacco: Never  Vaping Use   Vaping Use: Never used  Substance and Sexual Activity   Alcohol use: Yes    Comment: wine occ   Drug use: No   Sexual activity: Not Currently    Birth control/protection: Post-menopausal  Other Topics Concern   Not on file  Social History Narrative   Not on file   Social Determinants of Health   Financial Resource Strain: Low Risk  (02/07/2022)   Overall Financial Resource Strain (CARDIA)    Difficulty of Paying Living Expenses: Not hard at all  Food Insecurity: No Food Insecurity (02/07/2022)   Hunger Vital Sign    Worried About Running Out of Food in the Last Year: Never true    Circle in the Last Year: Never true  Transportation Needs: No Transportation Needs (02/07/2022)   PRAPARE - Hydrologist (Medical): No    Lack of Transportation (Non-Medical): No  Physical Activity: Insufficiently Active (02/07/2022)   Exercise Vital Sign    Days of Exercise per Week: 2 days    Minutes of Exercise per Session: 20 min  Stress: No Stress Concern Present (02/07/2022)   [place]    Feeling of  Stress : Not at all  Social Connections: Socially Integrated (07/09/2021)   Social Connection and Isolation Panel  [NHANES]    Frequency of Communication with Friends and Family: More than three times a week    Frequency of Social Gatherings with Friends and Family: Three times a week    Attends Religious Services: More than 4 times per year    Active Member of Clubs or Organizations: Yes    Attends Archivist Meetings: More than 4 times per year    Marital Status: Married  Human resources officer Violence: Not At Risk (07/09/2021)   Humiliation, Afraid, Rape, and Kick questionnaire    Fear of Current or Ex-Partner: No    Emotionally Abused: No    Physically Abused: No    Sexually Abused: No   Social History   Tobacco Use  Smoking Status Never  Smokeless Tobacco Never   Social History   Substance and Sexual Activity  Alcohol Use Yes   Comment: wine occ    Family History:  Family History  Problem Relation Age of Onset   Hyperlipidemia Mother    Hypertension Mother    CAD Mother    Thyroid disease Mother    Heart disease Mother    COPD Father    Thyroid disease Father    Cancer Brother        liver   Alcohol abuse Brother    Hypertension Brother    Liver disease Brother    Thyroid disease Sister    Diabetes Neg Hx    Stroke Neg Hx     Past medical history, surgical history, medications, allergies, family history and social history reviewed with patient today and changes made to appropriate areas of the chart.   Review of Systems - negative All other ROS negative except what is listed above and in the HPI.      Objective:    BP 136/82   Pulse 66   Temp 98 F (36.7 C) (Oral)   Ht '5\' 4"'$  (1.626 m)   Wt 190 lb 11.2 oz (86.5 kg)   SpO2 94%   BMI 32.73 kg/m   Wt Readings from Last 3 Encounters:  02/07/22 190 lb 11.2 oz (86.5 kg)  01/02/22 188 lb (85.3 kg)  08/06/21 191 lb 10.1 oz (86.9 kg)    Physical Exam Constitutional:      General: She is awake. She is not in acute distress.    Appearance: She is well-developed. She is not ill-appearing.  HENT:     Head:  Normocephalic and atraumatic.     Right Ear: Hearing, tympanic membrane, ear canal and external ear normal. No drainage.     Left Ear: Hearing, tympanic membrane, ear canal and external ear normal. No drainage.     Nose: Nose normal.     Right Sinus: No maxillary sinus tenderness or frontal sinus tenderness.     Left Sinus: No maxillary sinus tenderness or frontal sinus tenderness.     Mouth/Throat:     Mouth: Mucous membranes are moist.     Pharynx: Oropharynx is clear. Uvula midline. No pharyngeal swelling, oropharyngeal exudate or posterior oropharyngeal erythema.  Eyes:     General: Lids are normal.        Right eye: No discharge.        Left eye: No discharge.     Extraocular Movements: Extraocular movements intact.     Conjunctiva/sclera: Conjunctivae normal.     Pupils: Pupils are equal, round, and reactive  to light.     Visual Fields: Right eye visual fields normal and left eye visual fields normal.  Neck:     Thyroid: No thyromegaly.     Vascular: No carotid bruit.     Trachea: Trachea normal.  Cardiovascular:     Rate and Rhythm: Normal rate and regular rhythm.     Pulses:          Dorsalis pedis pulses are 2+ on the right side and 2+ on the left side.       Posterior tibial pulses are 2+ on the right side and 2+ on the left side.     Heart sounds: Normal heart sounds. No murmur heard.    No gallop.  Pulmonary:     Effort: Pulmonary effort is normal. No accessory muscle usage or respiratory distress.     Breath sounds: Normal breath sounds.  Abdominal:     General: Bowel sounds are normal.     Palpations: Abdomen is soft. There is no hepatomegaly or splenomegaly.     Tenderness: There is no abdominal tenderness.  Musculoskeletal:        General: Normal range of motion.     Cervical back: Normal range of motion and neck supple.     Right lower leg: No edema.     Left lower leg: No edema.     Right foot: Normal range of motion. Bunion present.     Left foot: Normal  range of motion. Bunion present.  Feet:     Right foot:     Protective Sensation: 10 sites tested.  10 sites sensed.     Skin integrity: Skin integrity normal.     Toenail Condition: Fungal disease present.    Left foot:     Protective Sensation: 10 sites tested.  10 sites sensed.     Skin integrity: Skin integrity normal.     Toenail Condition: Fungal disease present. Lymphadenopathy:     Head:     Right side of head: No submental, submandibular, tonsillar, preauricular or posterior auricular adenopathy.     Left side of head: No submental, submandibular, tonsillar, preauricular or posterior auricular adenopathy.     Cervical: No cervical adenopathy.  Skin:    General: Skin is warm and dry.     Capillary Refill: Capillary refill takes less than 2 seconds.     Findings: No rash.  Neurological:     Mental Status: She is alert and oriented to person, place, and time.     Gait: Gait is intact.     Deep Tendon Reflexes: Reflexes are normal and symmetric.     Reflex Scores:      Brachioradialis reflexes are 2+ on the right side and 2+ on the left side.      Patellar reflexes are 2+ on the right side and 2+ on the left side. Psychiatric:        Attention and Perception: Attention normal.        Mood and Affect: Mood normal.        Speech: Speech normal.        Behavior: Behavior normal. Behavior is cooperative.        Thought Content: Thought content normal.        Judgment: Judgment normal.    Results for orders placed or performed in visit on 01/25/22  HM MAMMOGRAPHY  Result Value Ref Range   HM Mammogram 0-4 Bi-Rad 0-4 Bi-Rad, Self Reported Normal      Assessment &  Plan:   Problem List Items Addressed This Visit       Cardiovascular and Mediastinum   Essential hypertension    Chronic, stable with BP at goal today in office and on home readings.  Recommend she monitor BP at least a few mornings a week at home and document.  DASH diet at home.  Continue current medication  regimen and adjust as needed.  Labs: CBC, CMP.  Return in 6 months.       Relevant Medications   losartan (COZAAR) 100 MG tablet   hydrochlorothiazide (HYDRODIURIL) 25 MG tablet   atorvastatin (LIPITOR) 40 MG tablet   Other Relevant Orders   Comprehensive metabolic panel     Digestive   Reflux esophagitis    Chronic, ongoing.  Continue current medication regimen at this time and adjust as needed.  Discussed the risks and benefits of long term PPI use including but not limited to bone loss, chronic kidney disease, infections, low magnesium.  Will aim to use at the lowest dose for the shortest period of time.  Mag level today.      Relevant Orders   Magnesium     Endocrine   Hypothyroidism    Chronic, ongoing.  Continue current medication regimen as recommended by endocrinology and collaboration with them.  Recent notes and labs reviewed.      Thyroid nodule    Chronic, ongoing.  Continue current medication regimen as ordered by endocrinology.        Nervous and Auditory   Epilepsy ([place]) - Primary    Chronic, stable.  Continue current medication regimen as recommended by neuro and collaboration with them.  CMP today, Keppra level up to date.  Recent neuro note reviewed.        Musculoskeletal and Integument   Osteopenia (Chronic)    Ongoing with DEXA last in 2020 -- repeat in September 2025.  Continue Vit D daily and recommend fall precautions.  Check Vit D level today.      Relevant Orders   VITAMIN D 25 Hydroxy (Vit-D Deficiency, Fractures)     Genitourinary   CKD (chronic kidney disease) stage 3, GFR 30-59 ml/min (HCC) (Chronic)    Chronic, stable.  Avoid NSAIDs.  Continue Losartan for kidney protection.  Labs: CMP today.  Consider nephrology referral if any decline.      Relevant Orders   CBC with Differential/Platelet   Comprehensive metabolic panel     Other   B12 deficiency    Chronic, ongoing.  Continue daily supplement and adjust as needed.  Check B12  level today.      Relevant Orders   CBC with Differential/Platelet   Vitamin B12   Depression, major, single episode, mild (HCC)    Chronic, ongoing.  Continue current medication regimen, Remeron and Lexapro, and adjust as needed -- currently followed by neurology for this.  Denies SI/HI.  Return in 6 months for follow-up.      Hyperlipemia    Chronic, ongoing.  Continue current medication regimen and adjust as needed.  Lipid panel today. Return in 6 months.      Relevant Medications   losartan (COZAAR) 100 MG tablet   hydrochlorothiazide (HYDRODIURIL) 25 MG tablet   atorvastatin (LIPITOR) 40 MG tablet   Other Relevant Orders   Comprehensive metabolic panel   Lipid Panel w/o Chol/HDL Ratio   Insomnia secondary to anxiety    Chronic, ongoing.  Continue current medication regimen, Remeron, and adjust as needed.  Iron deficiency anemia    Chronic, stable.  Check CBC, ferritin, iron, and B12 today.  Continue daily supplement and adjust plan of care as needed based on labs.      Relevant Orders   CBC with Differential/Platelet   Iron Binding Cap (TIBC)([facility])   Ferritin   Obesity    BMI 32.73.  Recommended eating smaller high protein, low fat meals more frequently and exercising 30 mins a day 5 times a week with a goal of 10-15lb weight loss in the next 3 months. Patient voiced their understanding and motivation to adhere to these recommendations.       Vitamin D deficiency    Chronic, ongoing.  Continue daily supplement and adjust as needed.  Last DEXA 2020.  Check Vit D level.      Relevant Orders   VITAMIN D 25 Hydroxy (Vit-D Deficiency, Fractures)   Other Visit Diagnoses     Onychomycosis       Penlac sent in.   Relevant Medications   ciclopirox (PENLAC) 8 % solution   Encounter for annual physical exam       Annual physical today with labs and health maintenance reviewed, discussed with patient.        Follow up plan: Return in about 6 months  (around 08/08/2022) for HTN/HLD, CKD, THYROID, SEIZURES, MOOD.   LABORATORY TESTING:  - Pap smear: not applicable  IMMUNIZATIONS:   - Tdap: Tetanus vaccination status reviewed: refused. - Influenza: Up to date - Pneumovax: Up to date - Prevnar: Up to date - HPV: Not applicable - Zostavax vaccine: Refuses, wishes to discontinue  SCREENING: -Mammogram: Up to date -- [facility] 09/20/21 - Colonoscopy: Up to date  - Bone Density: Up to date -- [facility] 02/16/2019 -Hearing Test: Not applicable  -Spirometry: Not applicable   PATIENT COUNSELING:   Advised to take 1 mg of folate supplement per day if capable of pregnancy.   Sexuality: Discussed sexually transmitted diseases, partner selection, use of condoms, avoidance of unintended pregnancy  and contraceptive alternatives.   Advised to avoid cigarette smoking.  I discussed with the patient that most people either abstain from alcohol or drink within safe limits (<=14/week and <=4 drinks/occasion for males, <=7/weeks and <= 3 drinks/occasion for females) and that the risk for alcohol disorders and other health effects rises proportionally with the number of drinks per week and how often a drinker exceeds daily limits.  Discussed cessation/primary prevention of drug use and availability of treatment for abuse.   Diet: Encouraged to adjust caloric intake to maintain  or achieve ideal body weight, to reduce intake of dietary saturated fat and total fat, to limit sodium intake by avoiding high sodium foods and not adding table salt, and to maintain adequate dietary potassium and calcium preferably from fresh fruits, vegetables, and low-fat dairy products.    Stressed the importance of regular exercise  Injury prevention: Discussed safety belts, safety helmets, smoke detector, smoking near bedding or upholstery.   Dental health: Discussed importance of regular tooth brushing, flossing, and dental visits.    NEXT PREVENTATIVE PHYSICAL DUE IN 1  YEAR. Return in about 6 months (around 08/08/2022) for HTN/HLD, CKD, THYROID, SEIZURES, MOOD.

## 2022-02-07 NOTE — Assessment & Plan Note (Signed)
Chronic, ongoing.  Continue current medication regimen, Remeron, and adjust as needed.   

## 2022-02-07 NOTE — Assessment & Plan Note (Signed)
Chronic, stable with BP at goal today in office and on home readings.  Recommend she monitor BP at least a few mornings a week at home and document.  DASH diet at home.  Continue current medication regimen and adjust as needed.  Labs: CBC, CMP.  Return in 6 months.

## 2022-02-07 NOTE — Assessment & Plan Note (Signed)
Chronic, ongoing.  Continue current medication regimen as ordered by endocrinology.

## 2022-02-07 NOTE — Assessment & Plan Note (Signed)
Ongoing with DEXA last in 2020 -- repeat in September 2025.  Continue Vit D daily and recommend fall precautions.  Check Vit D level today. 

## 2022-02-07 NOTE — Assessment & Plan Note (Signed)
Chronic, ongoing.  Continue daily supplement and adjust as needed.  Check B12 level today.

## 2022-02-07 NOTE — Assessment & Plan Note (Addendum)
Chronic, stable.  Check CBC, ferritin, iron, and B12 today.  Continue daily supplement and adjust plan of care as needed based on labs.

## 2022-02-07 NOTE — Assessment & Plan Note (Signed)
Chronic, ongoing.  Continue current medication regimen and adjust as needed.  Lipid panel today.  Return in 6 months. 

## 2022-02-08 ENCOUNTER — Other Ambulatory Visit: Payer: Self-pay | Admitting: Nurse Practitioner

## 2022-02-08 DIAGNOSIS — E876 Hypokalemia: Secondary | ICD-10-CM

## 2022-02-08 LAB — CBC WITH DIFFERENTIAL/PLATELET
Basophils Absolute: 0.1 10*3/uL (ref 0.0–0.2)
Basos: 1 %
EOS (ABSOLUTE): 0.1 10*3/uL (ref 0.0–0.4)
Eos: 3 %
Hematocrit: 42.2 % (ref 34.0–46.6)
Hemoglobin: 13.5 g/dL (ref 11.1–15.9)
Immature Grans (Abs): 0 10*3/uL (ref 0.0–0.1)
Immature Granulocytes: 0 %
Lymphocytes Absolute: 2 10*3/uL (ref 0.7–3.1)
Lymphs: 40 %
MCH: 29.7 pg (ref 26.6–33.0)
MCHC: 32 g/dL (ref 31.5–35.7)
MCV: 93 fL (ref 79–97)
Monocytes Absolute: 0.6 10*3/uL (ref 0.1–0.9)
Monocytes: 12 %
Neutrophils Absolute: 2.3 10*3/uL (ref 1.4–7.0)
Neutrophils: 44 %
Platelets: 267 10*3/uL (ref 150–450)
RBC: 4.55 x10E6/uL (ref 3.77–5.28)
RDW: 13.9 % (ref 11.7–15.4)
WBC: 5.1 10*3/uL (ref 3.4–10.8)

## 2022-02-08 LAB — LIPID PANEL W/O CHOL/HDL RATIO
Cholesterol, Total: 174 mg/dL (ref 100–199)
HDL: 69 mg/dL (ref >39)
LDL Chol Calc (NIH): 85 mg/dL (ref 0–99)
Triglycerides: 115 mg/dL (ref 0–149)
VLDL Cholesterol Cal: 20 mg/dL (ref 5–40)

## 2022-02-08 LAB — COMPREHENSIVE METABOLIC PANEL
ALT: 14 IU/L (ref 0–32)
AST: 22 IU/L (ref 0–40)
Albumin/Globulin Ratio: 1.8 (ref 1.2–2.2)
Albumin: 4.3 g/dL (ref 3.8–4.8)
Alkaline Phosphatase: 85 IU/L (ref 44–121)
BUN/Creatinine Ratio: 15 (ref 12–28)
BUN: 18 mg/dL (ref 8–27)
Bilirubin Total: 0.5 mg/dL (ref 0.0–1.2)
CO2: 23 mmol/L (ref 20–29)
Calcium: 9.7 mg/dL (ref 8.7–10.3)
Chloride: 102 mmol/L (ref 96–106)
Creatinine, Ser: 1.18 mg/dL — ABNORMAL HIGH (ref 0.57–1.00)
Globulin, Total: 2.4 g/dL (ref 1.5–4.5)
Glucose: 98 mg/dL (ref 70–99)
Potassium: 3.3 mmol/L — ABNORMAL LOW (ref 3.5–5.2)
Sodium: 141 mmol/L (ref 134–144)
Total Protein: 6.7 g/dL (ref 6.0–8.5)
eGFR: 49 mL/min/{1.73_m2} — ABNORMAL LOW (ref >59)

## 2022-02-08 LAB — IRON AND TIBC
Iron Saturation: 16 % (ref 15–55)
Iron: 56 ug/dL (ref 27–139)
Total Iron Binding Capacity: 350 ug/dL (ref 250–450)
UIBC: 294 ug/dL (ref 118–369)

## 2022-02-08 LAB — MAGNESIUM: Magnesium: 2 mg/dL (ref 1.6–2.3)

## 2022-02-08 LAB — FERRITIN: Ferritin: 17 ng/mL (ref 15–150)

## 2022-02-08 LAB — VITAMIN B12: Vitamin B-12: 652 pg/mL (ref 232–1245)

## 2022-02-08 LAB — VITAMIN D 25 HYDROXY (VIT D DEFICIENCY, FRACTURES): Vit D, 25-Hydroxy: 30.7 ng/mL (ref 30.0–100.0)

## 2022-02-08 NOTE — Progress Notes (Signed)
Contacted via [place] -- lab visit 3 weeks please:)   Good evening Rachel Cox, all your labs have returned and overall remain fantastic.  Kidney function has baseline kidney disease no worsening.  The only off thing is the potassium level is a bit low.  I would like you to increase potassium rich foods in diet, like bananas, mangoes, potatoes, nuts, dried fruit -- for the next few weeks and then return for lab visit only to recheck level.  Any questions about this? Keep being wonderful!!  Thank you for allowing me to participate in your care.  I appreciate you. Kindest regards, Ande Therrell

## 2022-02-18 ENCOUNTER — Ambulatory Visit: Payer: Self-pay | Admitting: *Deleted

## 2022-02-18 NOTE — Telephone Encounter (Signed)
  Chief Complaint: patient's husband called to report recent episode of not responding to calling her name. Hx seizures Symptoms: "starring off" not responding to calling pt name. Lasted less than 5 minutes. After episode patient was talking to a friend on the phone with out any issues. Potassium has been low . Frequency: last Wednesday  Pertinent Negatives: Patient denies per husband, no chest pain no difficulty breathing. No fever, no tremors or jerking. Disposition: '[]'$ ED /'[]'$ Urgent Care (no appt availability in office) / '[x]'$ Appointment(In office/virtual)/ '[]'$  [facility]/ '[x]'$ Home Care/ '[]'$ Refused Recommended Disposition /'[]'$ [facility]/ '[x]'$  Follow-up with PCP Additional Notes:   Patient's husband calling without patient. Husband reports he has concerns regarding "starring off" in to space and patient does not. Husband would like to know if PCP can call and talk with patient to come in for scheduled appt for 02/20/22. Husband feels wife will not come in for evaluation. Husband requesting "leave me out of it" due to patient may become upset he is calling dr.  Hilaria Ota scheduled for 02/20/22. Please advise. Patient's husband afraid patient will not come to appt.    Reason for Disposition  [1] Seizure lasting < 5 minutes AND [2] history of prior seizure(s) AND [3] taking anticonvulsants  Answer Assessment - Initial Assessment Questions 1. ONSET: "When did the seizure occur?"     Last week , Wednesday evening while watching TV per husband  2. DURATION: "How long did the seizure last (or how long has it been happening)?" (e.g., seconds, minutes)  Note: Most seizures last less than 5 minutes.     Last than 5 minutes.  3. DESCRIPTION: "Describe what happened during the seizure." "Did the body become stiff?" "Was there any jerking?"  "Did they lose consciousness during the seizure?"     Patient watching TV and "starring off" not responding to husband calling her name. No tremors or jerking  noted  4. CIRCUMSTANCE: "What was the person doing when the seizure began?"      Watching TV 5. MENTAL STATUS AFTER SEIZURE: "Does the person seem more groggy or sleepy?" "Does the person know who they are, who you are, and where they are now?"      After episode of starring into space, patient able to talk to friend on phone without any issues. 6. PRIOR SEIZURES: "Has the person had a seizure (convulsion) before?" (e.g., epilepsy, other cause)  If Yes, ask: "When was the last time?" and "What happened last time?"      Yes  7. EPILEPSY: "Does the person have epilepsy?" Note: Check for medical ID bracelet.     na 8. MEDICINES: "Does the person take anticonvulsant medications?" (e.g., Yes, No; missed doses, any recent changes)     Yes has not missed doses 9. INJURY: "Was the person hurt or injured during the seizure?" (e.g., hit their head, bit their tongue)     no 10. OTHER SYMPTOMS: "Are there any other symptoms?" (e.g., fever, headache)       Low potassium 11. PREGNANCY: "Is there any chance you are pregnant?" "When was your last menstrual period?"       na  Protocols used: [facility]

## 2022-02-19 NOTE — Telephone Encounter (Signed)
Spoke with patient to follow up in regards to her cancelled appointment scheduled for tomorrow 02/20/2022. Patient says "she canceled the appointment due to her husband over exaggerating and she is fine and does not need the appointment."

## 2022-02-19 NOTE — Telephone Encounter (Signed)
Noted  

## 2022-02-20 ENCOUNTER — Ambulatory Visit: Payer: PPO | Admitting: Nurse Practitioner

## 2022-03-04 ENCOUNTER — Other Ambulatory Visit: Payer: PPO

## 2022-03-04 DIAGNOSIS — E876 Hypokalemia: Secondary | ICD-10-CM

## 2022-03-05 LAB — POTASSIUM: Potassium: 3.5 mmol/L (ref 3.5–5.2)

## 2022-03-05 NOTE — Progress Notes (Signed)
Contacted via MyChart   Potassium level now improved in normal range.  No further changes needed.

## 2022-03-11 DIAGNOSIS — H40153 Residual stage of open-angle glaucoma, bilateral: Secondary | ICD-10-CM | POA: Diagnosis not present

## 2022-03-15 ENCOUNTER — Ambulatory Visit (LOCAL_COMMUNITY_HEALTH_CENTER): Payer: PPO

## 2022-03-15 DIAGNOSIS — Z23 Encounter for immunization: Secondary | ICD-10-CM

## 2022-03-15 DIAGNOSIS — Z719 Counseling, unspecified: Secondary | ICD-10-CM

## 2022-03-15 NOTE — Progress Notes (Signed)
  Are you feeling sick today? No   Have you ever received a dose of COVID-19 Vaccine? [employer], [place], [place], [place], Other) Yes  If yes, which vaccine and how many doses?   PFIZER, 4   Did you bring the vaccination record card or other documentation?  Yes   Do you have a health condition or are undergoing treatment that makes you moderately or severely immunocompromised? This would include, but not be limited to: cancer, HIV, organ transplant, immunosuppressive therapy/high-dose corticosteroids, or moderate/severe primary immunodeficiency.  No  Have you received COVID-19 vaccine before or during hematopoietic cell transplant (HCT) or CAR-T-cell therapies? No  Have you ever had an allergic reaction to: (This would include a severe allergic reaction or a reaction that caused hives, swelling, or respiratory distress, including wheezing.) A component of a COVID-19 vaccine or a previous dose of COVID-19 vaccine? No   Have you ever had an allergic reaction to another vaccine (other thanCOVID-19 vaccine) or an injectable medication? (This would include a severe allergic reaction or a reaction that caused hives, swelling, or respiratory distress, including wheezing.)   No    Do you have a history of any of the following:  Myocarditis or Pericarditis No  Dermal fillers:  No  Multisystem Inflammatory Syndrome (MIS-C or MIS-A)? No  COVID-19 disease within the past 3 months? No  Vaccinated with monkeypox vaccine in the last 4 weeks? No  Eligible and administered Comirnaty 12+2023-2024, monitored, tolerated well. Verbalized understanding of VIS and NCIR copy. M.Zaynah Chawla, LPN.

## 2022-05-07 DIAGNOSIS — R569 Unspecified convulsions: Secondary | ICD-10-CM | POA: Diagnosis not present

## 2022-05-07 DIAGNOSIS — I10 Essential (primary) hypertension: Secondary | ICD-10-CM | POA: Diagnosis not present

## 2022-05-07 DIAGNOSIS — F419 Anxiety disorder, unspecified: Secondary | ICD-10-CM | POA: Diagnosis not present

## 2022-06-07 ENCOUNTER — Other Ambulatory Visit: Payer: Self-pay | Admitting: Nurse Practitioner

## 2022-06-07 NOTE — Telephone Encounter (Signed)
Requested medication (s) are due for refill today - expired Rx  Requested medication (s) are on the active medication list -yes  Future visit scheduled -yes  Last refill: 02/07/21 #90 4RF  Notes to clinic: expired Rx  Requested Prescriptions  Pending Prescriptions Disp Refills   pantoprazole (PROTONIX) 40 MG tablet [Pharmacy Med Name: Pantoprazole Sodium 40 MG Oral Tablet Delayed Release] 90 tablet 0    Sig: Take 1 tablet by mouth once daily     Gastroenterology: Proton Pump Inhibitors Passed - 06/07/2022  1:13 PM      Passed - Valid encounter within last 12 months    Recent Outpatient Visits           4 months ago 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 status epilepticus, not intractable ([place])   [place], [name] T, NP   10 months ago 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 status epilepticus, not intractable ([place])   [place], [name] T, NP   1 year ago Stage 3a chronic kidney disease ([place])   [place], [name] T, NP   1 year ago 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 status epilepticus, not intractable ([place])   [place], [name] T, NP   1 year ago Recurrent major depressive disorder, in partial remission ([place])   [place], Barbaraann Faster, NP       Future Appointments             In 2 months Cannady, Beverly T, NP [employer]               Requested Prescriptions  Pending Prescriptions Disp Refills   pantoprazole (PROTONIX) 40 MG tablet [Pharmacy Med Name: Pantoprazole Sodium 40 MG Oral Tablet Delayed Release] 90 tablet 0    Sig: Take 1 tablet by mouth once daily     Gastroenterology: Proton Pump Inhibitors Passed - 06/07/2022  1:13 PM      Passed - Valid encounter within last 12 months    Recent Outpatient Visits           4 months ago 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 status epilepticus, not intractable ([place])   [place], [name] T, NP   10 months ago 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  status epilepticus, not intractable ([place])   [place], [name] T, NP   1 year ago Stage 3a chronic kidney disease ([place])   [place], [name] T, NP   1 year ago 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 status epilepticus, not intractable ([place])   [place], [name] T, NP   1 year ago Recurrent major depressive disorder, in partial remission ([place])   [place], Barbaraann Faster, NP       Future Appointments             In 2 months Cannady, Barbaraann Faster, NP [facility]

## 2022-07-22 ENCOUNTER — Ambulatory Visit (INDEPENDENT_AMBULATORY_CARE_PROVIDER_SITE_OTHER): Payer: PPO

## 2022-07-22 VITALS — Ht 64.0 in | Wt 190.0 lb

## 2022-07-22 DIAGNOSIS — Z0000 Encounter for general adult medical examination without abnormal findings: Secondary | ICD-10-CM

## 2022-07-22 NOTE — Patient Instructions (Signed)
[name] , Thank you for taking time to come for your Medicare Wellness Visit. I appreciate your ongoing commitment to your health goals. Please review the following plan we discussed and let me know if I can assist you in the future.   These are the goals we discussed:  Goals      DIET - EAT MORE FRUITS AND VEGETABLES     DIET - INCREASE WATER INTAKE     Recommend to drink at least 6-8 8oz glasses of water per day.     Patient Stated     07/07/2020, wants to weigh 150 pounds     Weight (lb) < 200 lb (90.7 kg)        This is a list of the screening recommended for you and due dates:  Health Maintenance  Topic Date Due   DTaP/Tdap/Td vaccine (2 - Tdap) 07/04/2008   Colon Cancer Screening  09/11/2022   Mammogram  09/21/2022   Medicare Annual Wellness Visit  07/23/2023   DEXA scan (bone density measurement)  02/16/2024   Pneumonia Vaccine  Completed   Flu Shot  Completed   COVID-19 Vaccine  Completed   Hepatitis C Screening: USPSTF Recommendation to screen - Ages 49-79 yo.  Addressed   HPV Vaccine  Aged Out   Zoster (Shingles) Vaccine  Discontinued    Advanced directives: NO  Conditions/risks identified: NONE  Next appointment: Follow up in one year for your annual wellness visit 07/28/23 @ 2:15 PM BY PHONE   Preventive Care 65 Years and Older, Female Preventive care refers to lifestyle choices and visits with your health care provider that can promote health and wellness. What does preventive care include? A yearly physical exam. This is also called an annual well check. Dental exams once or twice a year. Routine eye exams. Ask your health care provider how often you should have your eyes checked. Personal lifestyle choices, including: Daily care of your teeth and gums. Regular physical activity. Eating a healthy diet. Avoiding tobacco and drug use. Limiting alcohol use. Practicing safe sex. Taking low-dose aspirin every day. Taking vitamin and mineral supplements  as recommended by your health care provider. What happens during an annual well check? The services and screenings done by your health care provider during your annual well check will depend on your age, overall health, lifestyle risk factors, and family history of disease. Counseling  Your health care provider may ask you questions about your: Alcohol use. Tobacco use. Drug use. Emotional well-being. Home and relationship well-being. Sexual activity. Eating habits. History of falls. Memory and ability to understand (cognition). Work and work Statistician. Reproductive health. Screening  You may have the following tests or measurements: Height, weight, and BMI. Blood pressure. Lipid and cholesterol levels. These may be checked every 5 years, or more frequently if you are over 78 years old. Skin check. Lung cancer screening. You may have this screening every year starting at age 91 if you have a 30-pack-year history of smoking and currently smoke or have quit within the past 15 years. Fecal occult blood test (FOBT) of the stool. You may have this test every year starting at age 37. Flexible sigmoidoscopy or colonoscopy. You may have a sigmoidoscopy every 5 years or a colonoscopy every 10 years starting at age 98. Hepatitis C blood test. Hepatitis B blood test. Sexually transmitted disease (STD) testing. Diabetes screening. This is done by checking your blood sugar (glucose) after you have not eaten for a while (fasting). You may  have this done every 1-3 years. Bone density scan. This is done to screen for osteoporosis. You may have this done starting at age 79. Mammogram. This may be done every 1-2 years. Talk to your health care provider about how often you should have regular mammograms. Talk with your health care provider about your test results, treatment options, and if necessary, the need for more tests. Vaccines  Your health care provider may recommend certain vaccines, such  as: Influenza vaccine. This is recommended every year. Tetanus, diphtheria, and acellular pertussis (Tdap, Td) vaccine. You may need a Td booster every 10 years. Zoster vaccine. You may need this after age 83. Pneumococcal 13-valent conjugate (PCV13) vaccine. One dose is recommended after age 61. Pneumococcal polysaccharide (PPSV23) vaccine. One dose is recommended after age 20. Talk to your health care provider about which screenings and vaccines you need and how often you need them. This information is not intended to replace advice given to you by your health care provider. Make sure you discuss any questions you have with your health care provider. Document Released: 06/16/2015 Document Revised: 02/07/2016 Document Reviewed: 03/21/2015 Elsevier Interactive Patient Education  2017 [place] Prevention in the Home Falls can cause injuries. They can happen to people of all ages. There are many things you can do to make your home safe and to help prevent falls. What can I do on the outside of my home? Regularly fix the edges of walkways and driveways and fix any cracks. Remove anything that might make you trip as you walk through a door, such as a raised step or threshold. Trim any bushes or trees on the path to your home. Use bright outdoor lighting. Clear any walking paths of anything that might make someone trip, such as rocks or tools. Regularly check to see if handrails are loose or broken. Make sure that both sides of any steps have handrails. Any raised decks and porches should have guardrails on the edges. Have any leaves, snow, or ice cleared regularly. Use sand or salt on walking paths during winter. Clean up any spills in your garage right away. This includes oil or grease spills. What can I do in the bathroom? Use night lights. Install grab bars by the toilet and in the tub and shower. Do not use towel bars as grab bars. Use non-skid mats or decals in the tub or  shower. If you need to sit down in the shower, use a plastic, non-slip stool. Keep the floor dry. Clean up any water that spills on the floor as soon as it happens. Remove soap buildup in the tub or shower regularly. Attach bath mats securely with double-sided non-slip rug tape. Do not have throw rugs and other things on the floor that can make you trip. What can I do in the bedroom? Use night lights. Make sure that you have a light by your bed that is easy to reach. Do not use any sheets or blankets that are too big for your bed. They should not hang down onto the floor. Have a firm chair that has side arms. You can use this for support while you get dressed. Do not have throw rugs and other things on the floor that can make you trip. What can I do in the kitchen? Clean up any spills right away. Avoid walking on wet floors. Keep items that you use a lot in easy-to-reach places. If you need to reach something above you, use a strong step  stool that has a grab bar. Keep electrical cords out of the way. Do not use floor polish or wax that makes floors slippery. If you must use wax, use non-skid floor wax. Do not have throw rugs and other things on the floor that can make you trip. What can I do with my stairs? Do not leave any items on the stairs. Make sure that there are handrails on both sides of the stairs and use them. Fix handrails that are broken or loose. Make sure that handrails are as long as the stairways. Check any carpeting to make sure that it is firmly attached to the stairs. Fix any carpet that is loose or worn. Avoid having throw rugs at the top or bottom of the stairs. If you do have throw rugs, attach them to the floor with carpet tape. Make sure that you have a light switch at the top of the stairs and the bottom of the stairs. If you do not have them, ask someone to add them for you. What else can I do to help prevent falls? Wear shoes that: Do not have high heels. Have  rubber bottoms. Are comfortable and fit you well. Are closed at the toe. Do not wear sandals. If you use a stepladder: Make sure that it is fully opened. Do not climb a closed stepladder. Make sure that both sides of the stepladder are locked into place. Ask someone to hold it for you, if possible. Clearly mark and make sure that you can see: Any grab bars or handrails. First and last steps. Where the edge of each step is. Use tools that help you move around (mobility aids) if they are needed. These include: Canes. Walkers. Scooters. Crutches. Turn on the lights when you go into a dark area. Replace any light bulbs as soon as they burn out. Set up your furniture so you have a clear path. Avoid moving your furniture around. If any of your floors are uneven, fix them. If there are any pets around you, be aware of where they are. Review your medicines with your doctor. Some medicines can make you feel dizzy. This can increase your chance of falling. Ask your doctor what other things that you can do to help prevent falls. This information is not intended to replace advice given to you by your health care provider. Make sure you discuss any questions you have with your health care provider. Document Released: 03/16/2009 Document Revised: 10/26/2015 Document Reviewed: 06/24/2014 Elsevier Interactive Patient Education  2017 [employer].

## 2022-07-22 NOTE — Progress Notes (Signed)
I connected with  Hurley Cisco on 07/22/22 by a audio enabled telemedicine application and verified that I am speaking with the correct person using two identifiers.  Patient Location: Home  Provider Location: Home Office  I discussed the limitations of evaluation and management by telemedicine. The patient expressed understanding and agreed to proceed.  Subjective:   Rachel Cox is a 72 y.o. female who presents for Medicare Annual (Subsequent) preventive examination.  Review of Systems     Cardiac Risk Factors include: advanced age (>23mn, >>80women)     Objective:    There were no vitals filed for this visit. There is no height or weight on file to calculate BMI.     07/22/2022    3:05 PM 07/09/2021    8:29 AM 07/07/2020    8:18 AM 02/01/2019    8:15 AM 12/12/2017    7:48 AM 12/05/2017   11:28 AM 11/14/2017    2:08 PM  Advanced Directives  Does Patient Have a Medical Advance Directive? No No No No No No No  Would patient like information on creating a medical advance directive? No - Patient declined No - Patient declined  Yes (MAU/Ambulatory/Procedural Areas - Information given) No - Patient declined  Yes (MAU/Ambulatory/Procedural Areas - Information given)    Current Medications (verified) Outpatient Encounter Medications as of 07/22/2022  Medication Sig   atorvastatin (LIPITOR) 40 MG tablet Take 1 tablet (40 mg total) by mouth at bedtime.   Cholecalciferol 25 MCG (1000 UT) tablet Take 1 tablet (1,000 Units total) by mouth daily.   dorzolamide (TRUSOPT) 2 % ophthalmic solution 1 drop 2 (two) times daily.   escitalopram (LEXAPRO) 10 MG tablet Take 1 tablet (10 mg total) by mouth daily.   folic acid (FOLVITE) 4A999333MCG tablet Take 400 mcg by mouth daily.   hydrochlorothiazide (HYDRODIURIL) 25 MG tablet Take 0.5 tablets (12.5 mg total) by mouth daily.   levETIRAcetam (KEPPRA) 250 MG tablet Take 250 mg by mouth at bedtime.    levothyroxine (SYNTHROID) 75 MCG tablet Take 1 tablet  (75 mcg total) by mouth daily. Take one tablet every morning   losartan (COZAAR) 100 MG tablet Take 1 tablet (100 mg total) by mouth daily.   mirtazapine (REMERON) 30 MG tablet Take 1 tablet (30 mg total) by mouth every morning.   Multiple Vitamin (MULTIVITAMIN) tablet Take 1 tablet by mouth 3 (three) times a week.    pantoprazole (PROTONIX) 40 MG tablet Take 1 tablet by mouth once daily   vitamin B-12 (CYANOCOBALAMIN) 500 MCG tablet Take 500 mcg by mouth daily.   ciclopirox (PENLAC) 8 % solution Apply topically at bedtime. Apply over nail and surrounding skin. Apply daily over previous coat. After seven (7) days, may remove with alcohol and continue cycle. (Patient not taking: Reported on 07/22/2022)   No facility-administered encounter medications on file as of 07/22/2022.    Allergies (verified) Aspirin, Flonase [fluticasone propionate], and Codeine   History: Past Medical History:  Diagnosis Date   Allergic rhinitis    Allergy    Anemia    Anxiety    Depression    [place]    managed by Dr. PRexene Alberts2015/2016   GERD (gastroesophageal reflux disease)    History of abnormal cervical Pap smear 1980's   Hyperglycemia    Hyperlipidemia    Hypertension    Hypothyroidism    Insomnia    Obesity (BMI 30.0-34.9) 01/06/2017   Osteopenia    Reflux esophagitis 10/31/2017  EGD   Past Surgical History:  Procedure Laterality Date   COLONOSCOPY  Sept 2015   COLONOSCOPY WITH PROPOFOL N/A 09/10/2017   Procedure: COLONOSCOPY WITH PROPOFOL;  Surgeon: Robert Bellow, MD;  Location: [facility];  Service: Endoscopy;  Laterality: N/A;   Cryo Procedure  1980s   for abnormal pap   ESOPHAGOGASTRODUODENOSCOPY (EGD) WITH PROPOFOL N/A 09/10/2017   Procedure: ESOPHAGOGASTRODUODENOSCOPY (EGD) WITH PROPOFOL;  Surgeon: Robert Bellow, MD;  Location: [facility] END[facility];  Service: Endoscopy;  Laterality: N/A;   ESOPHAGOGASTRODUODENOSCOPY (EGD) WITH PROPOFOL N/A 10/29/2017   Procedure:  ESOPHAGOGASTRODUODENOSCOPY (EGD) WITH PROPOFOL;  Surgeon: Robert Bellow, MD;  Location: [facility] ENDOSCOPY;  Service: Endoscopy;  Laterality: N/A;   EYE SURGERY Bilateral 2021   cataract removal   LIPOMA EXCISION N/A 12/12/2017   Procedure: EXCISION BACK LIPOMA;  Surgeon: Robert Bellow, MD;  Location: [facility] ORS;  Service: General;  Laterality: N/A;   TUBAL LIGATION  1969   Family History  Problem Relation Age of Onset   Hyperlipidemia Mother    Hypertension Mother    CAD Mother    Thyroid disease Mother    Heart disease Mother    COPD Father    Thyroid disease Father    Cancer Brother        liver   Alcohol abuse Brother    Hypertension Brother    Liver disease Brother    Thyroid disease Sister    Diabetes Neg Hx    Stroke Neg Hx    Social History   Socioeconomic History   Marital status: Married    Spouse name: Girard Cooter   Number of children: 4   Years of education: Not on file   Highest education level: 10th grade  Occupational History   Occupation: Retired  Tobacco Use   Smoking status: Never   Smokeless tobacco: Never  Vaping Use   Vaping Use: Never used  Substance and Sexual Activity   Alcohol use: Yes    Comment: wine occ   Drug use: No   Sexual activity: Not Currently    Birth control/protection: Post-menopausal  Other Topics Concern   Not on file  Social History Narrative   Not on file   Social Determinants of Health   Financial Resource Strain: Low Risk  (07/22/2022)   Overall Financial Resource Strain (CARDIA)    Difficulty of Paying Living Expenses: Not hard at all  Food Insecurity: No Food Insecurity (07/22/2022)   Hunger Vital Sign    Worried About Running Out of Food in the Last Year: Never true    Ran Out of Food in the Last Year: Never true  Transportation Needs: No Transportation Needs (07/22/2022)   PRAPARE - Hydrologist (Medical): No    Lack of Transportation (Non-Medical): No  Physical Activity:  Insufficiently Active (07/22/2022)   Exercise Vital Sign    Days of Exercise per Week: 2 days    Minutes of Exercise per Session: 20 min  Stress: No Stress Concern Present (07/22/2022)   [place]    Feeling of Stress : Not at all  Social Connections: Moderately Integrated (07/22/2022)   Social Connection and Isolation Panel [NHANES]    Frequency of Communication with Friends and Family: More than three times a week    Frequency of Social Gatherings with Friends and Family: Twice a week    Attends Religious Services: More than 4 times per year    Active  Member of Clubs or Organizations: No    Attends Music therapist: Never    Marital Status: Married    Tobacco Counseling Counseling given: Not Answered   Clinical Intake:  Pre-visit preparation completed: Yes  Pain : No/denies pain     Nutritional Risks: None Diabetes: No  How often do you need to have someone help you when you read instructions, pamphlets, or other written materials from your doctor or pharmacy?: 1 - Never  Diabetic?O  Interpreter Needed?: No  Information entered by :: Mariska Daffin LPN   Activities of Daily Living    07/22/2022    3:06 PM 02/07/2022    8:19 AM  In your present state of health, do you have any difficulty performing the following activities:  Hearing? 0 0  Vision? 0 0  Difficulty concentrating or making decisions? 0 0  Walking or climbing stairs? 0 0  Dressing or bathing? 0 0  Doing errands, shopping? 0 0  Preparing Food and eating ? N   Using the Toilet? N   In the past six months, have you accidently leaked urine? N   Do you have problems with loss of bowel control? N   Managing your Medications? N   Managing your Finances? N   Housekeeping or managing your Housekeeping? N     Patient Care Team: Venita Lick, NP as PCP - General (Nurse Practitioner) Anabel Bene, MD as Referring  Physician (Neurology) Renato Shin, MD (Inactive) as Consulting Physician (Endocrinology) Bary Castilla Forest Gleason, MD as Consulting Physician (General Surgery)  Indicate any recent Medical Services you may have received from other than [facility] providers in the past year (date may be approximate).     Assessment:   This is a routine wellness examination for Anastassia.  Hearing/Vision screen Hearing Screening - Comments:: NO AIDS Vision Screening - Comments:: READERS- DR.BELL  Dietary issues and exercise activities discussed: Current Exercise Habits: Home exercise routine, Type of exercise: walking, Time (Minutes): 20, Frequency (Times/Week): 2, Weekly Exercise (Minutes/Week): 40, Intensity: Mild   Goals Addressed             This Visit's Progress    DIET - EAT MORE FRUITS AND VEGETABLES         Depression Screen    07/22/2022    3:04 PM 02/07/2022    8:05 AM 08/06/2021    8:23 AM 08/06/2021    8:22 AM 07/09/2021    8:20 AM 05/08/2021    8:46 AM 02/06/2021    8:14 AM  PHQ 2/9 Scores  PHQ - 2 Score 0 0 0 0 0 0 0  PHQ- 9 Score 0 0 0   0 0    Fall Risk    07/22/2022    3:06 PM 02/07/2022    8:05 AM 07/09/2021    8:21 AM 02/06/2021    8:44 AM 08/01/2020    8:30 AM  Fall Risk   Falls in the past year? 0 0 0 0 0  Number falls in past yr: 0 0 0 0   Injury with Fall? 0 0 0 0 0  Risk for fall due to : No Fall Risks No Fall Risks  No Fall Risks   Follow up Falls prevention discussed;Falls evaluation completed Falls evaluation completed Falls evaluation completed;Falls prevention discussed Falls prevention discussed     FALL RISK PREVENTION PERTAINING TO THE HOME:  Any stairs in or around the home? Yes  If so, are there any without handrails? No  Home free of loose throw rugs in walkways, pet beds, electrical cords, etc? Yes  Adequate lighting in your home to reduce risk of falls? Yes   ASSISTIVE DEVICES UTILIZED TO PREVENT FALLS:  Life alert? No  Use of a cane, Postema or w/c? No  Grab bars in  the bathroom? Yes  Shower chair or bench in shower? No  Elevated toilet seat or a handicapped toilet? No   Cognitive Function:        07/22/2022    3:10 PM 07/07/2020    8:20 AM 02/01/2019    8:15 AM 11/14/2017    2:10 PM 11/20/2016   11:31 AM  6CIT Screen  What Year? 0 points 0 points 0 points 0 points 0 points  What month? 0 points 0 points 0 points 0 points 0 points  What time? 0 points 0 points 0 points 0 points 0 points  Count back from 20 0 points 0 points 0 points 0 points 0 points  Months in reverse 0 points 2 points 0 points 0 points 2 points  Repeat phrase 0 points 4 points 0 points 2 points 2 points  Total Score 0 points 6 points 0 points 2 points 4 points    Immunizations Immunization History  Administered Date(s) Administered   COVID-19, mRNA, vaccine(Comirnaty)12 years and older 03/15/2022   Fluad Quad(high Dose 65+) 02/01/2019, 03/04/2022   Influenza, High Dose Seasonal PF 03/14/2017, 03/10/2018, 03/07/2021   Influenza,inj,Quad PF,6+ Mos 03/02/2015   Influenza-Unspecified 04/16/2016, 02/15/2020   PFIZER(Purple Top)SARS-COV-2 Vaccination 07/14/2019, 08/04/2019, 03/03/2020, 02/19/2021   Pneumococcal Conjugate-13 11/15/2013   Pneumococcal Polysaccharide-23 07/04/2015   Td 07/04/1998   Zoster, Live 06/03/2010    TDAP status: Due, Education has been provided regarding the importance of this vaccine. Advised may receive this vaccine at local pharmacy or Health Dept. Aware to provide a copy of the vaccination record if obtained from local pharmacy or Health Dept. Verbalized acceptance and understanding.  Flu Vaccine status: Up to date  Pneumococcal vaccine status: Up to date  Covid-19 vaccine status: Completed vaccines  Qualifies for Shingles Vaccine? Yes   Zostavax completed Yes   Shingrix Completed?: No.    Education has been provided regarding the importance of this vaccine. Patient has been advised to call insurance company to determine out of pocket expense if  they have not yet received this vaccine. Advised may also receive vaccine at local pharmacy or Health Dept. Verbalized acceptance and understanding.  Screening Tests Health Maintenance  Topic Date Due   DTaP/Tdap/Td (2 - Tdap) 07/04/2008   COLONOSCOPY (Pts 45-61yr Insurance coverage will need to be confirmed)  09/11/2022   MAMMOGRAM  09/21/2022   Medicare Annual Wellness (AWV)  07/23/2023   DEXA SCAN  02/16/2024   Pneumonia Vaccine 72 Years old  Completed   INFLUENZA VACCINE  Completed   COVID-19 Vaccine  Completed   Hepatitis C Screening  Addressed   HPV VACCINES  Aged Out   Zoster Vaccines- Shingrix  Discontinued    Health Maintenance  Health Maintenance Due  Topic Date Due   DTaP/Tdap/Td (2 - Tdap) 07/04/2008    Colorectal cancer screening: Type of screening: Colonoscopy. Completed 09/10/17. Repeat every 5 years- HAS APPT 09/09/22  Mammogram status: Completed 09/20/21. Repeat every year  Bone Density status: Completed 02/16/19. Results reflect: Bone density results: NORMAL. Repeat every 5 years.  Lung Cancer Screening: (Low Dose CT Chest recommended if Age 74-80years, 30 pack-year currently smoking OR have quit w/in 15years.) does not qualify.  Additional Screening:  Hepatitis C Screening: does qualify; Completed 11/15/13  Vision Screening: Recommended annual ophthalmology exams for early detection of glaucoma and other disorders of the eye. Is the patient up to date with their annual eye exam?  Yes  Who is the provider or what is the name of the office in which the patient attends annual eye exams? DR.BELL If pt is not established with a provider, would they like to be referred to a provider to establish care? No .   Dental Screening: Recommended annual dental exams for proper oral hygiene  Community Resource Referral / Chronic Care Management: CRR required this visit?  No   CCM required this visit?  No      Plan:     I have personally reviewed and noted the  following in the patient's chart:   Medical and social history Use of alcohol, tobacco or illicit drugs  Current medications and supplements including opioid prescriptions. Patient is not currently taking opioid prescriptions. Functional ability and status Nutritional status Physical activity Advanced directives List of other physicians Hospitalizations, surgeries, and ER visits in previous 12 months Vitals Screenings to include cognitive, depression, and falls Referrals and appointments  In addition, I have reviewed and discussed with patient certain preventive protocols, quality metrics, and best practice recommendations. A written personalized care plan for preventive services as well as general preventive health recommendations were provided to patient.     Dionisio David, LPN   [id number]   Nurse Notes: Marlynn Perking

## 2022-08-04 NOTE — Patient Instructions (Signed)

## 2022-08-08 ENCOUNTER — Encounter: Payer: Self-pay | Admitting: Nurse Practitioner

## 2022-08-08 ENCOUNTER — Ambulatory Visit (INDEPENDENT_AMBULATORY_CARE_PROVIDER_SITE_OTHER): Payer: PPO | Admitting: Nurse Practitioner

## 2022-08-08 VITALS — BP 124/73 | HR 65 | Temp 97.9°F | Wt 186.6 lb

## 2022-08-08 DIAGNOSIS — G40802 Other epilepsy, not intractable, without status epilepticus: Secondary | ICD-10-CM

## 2022-08-08 DIAGNOSIS — I10 Essential (primary) hypertension: Secondary | ICD-10-CM | POA: Diagnosis not present

## 2022-08-08 DIAGNOSIS — H401131 Primary open-angle glaucoma, bilateral, mild stage: Secondary | ICD-10-CM

## 2022-08-08 DIAGNOSIS — F419 Anxiety disorder, unspecified: Secondary | ICD-10-CM | POA: Diagnosis not present

## 2022-08-08 DIAGNOSIS — E559 Vitamin D deficiency, unspecified: Secondary | ICD-10-CM | POA: Diagnosis not present

## 2022-08-08 DIAGNOSIS — E6609 Other obesity due to excess calories: Secondary | ICD-10-CM

## 2022-08-08 DIAGNOSIS — E039 Hypothyroidism, unspecified: Secondary | ICD-10-CM

## 2022-08-08 DIAGNOSIS — M8588 Other specified disorders of bone density and structure, other site: Secondary | ICD-10-CM | POA: Diagnosis not present

## 2022-08-08 DIAGNOSIS — F320 Major depressive disorder, single episode, mild: Secondary | ICD-10-CM | POA: Diagnosis not present

## 2022-08-08 DIAGNOSIS — N1831 Chronic kidney disease, stage 3a: Secondary | ICD-10-CM

## 2022-08-08 DIAGNOSIS — R809 Proteinuria, unspecified: Secondary | ICD-10-CM

## 2022-08-08 DIAGNOSIS — E66811 Obesity, class 1: Secondary | ICD-10-CM

## 2022-08-08 DIAGNOSIS — Z23 Encounter for immunization: Secondary | ICD-10-CM

## 2022-08-08 DIAGNOSIS — Z6832 Body mass index (BMI) 32.0-32.9, adult: Secondary | ICD-10-CM

## 2022-08-08 DIAGNOSIS — F5105 Insomnia due to other mental disorder: Secondary | ICD-10-CM

## 2022-08-08 DIAGNOSIS — E041 Nontoxic single thyroid nodule: Secondary | ICD-10-CM | POA: Diagnosis not present

## 2022-08-08 DIAGNOSIS — E782 Mixed hyperlipidemia: Secondary | ICD-10-CM

## 2022-08-08 LAB — MICROALBUMIN, URINE WAIVED
Creatinine, Urine Waived: 50 mg/dL (ref 10–300)
Microalb, Ur Waived: 30 mg/L — ABNORMAL HIGH (ref 0–19)

## 2022-08-08 NOTE — Assessment & Plan Note (Signed)
Followed by Dr. Gloriann Loan, continue this collaboration and recommend avoiding Flonase products.

## 2022-08-08 NOTE — Assessment & Plan Note (Signed)
Chronic, ongoing.  Continue current medication regimen as recommended by endocrinology.  Will take over monitoring thyroid in PCP office per patient request and return to endo as needed.  Thyroid labs today.

## 2022-08-08 NOTE — Assessment & Plan Note (Signed)
Chronic, ongoing.  Continue current medication regimen, Remeron, and adjust as needed.

## 2022-08-08 NOTE — Assessment & Plan Note (Signed)
Continue Losartan for kidney protection, check urine ALB today.

## 2022-08-08 NOTE — Assessment & Plan Note (Signed)
Chronic, stable.  Avoid NSAIDs.  Continue Losartan for kidney protection.  Labs: CMP and urine ALB today.  Consider nephrology referral if any decline.

## 2022-08-08 NOTE — Assessment & Plan Note (Signed)
Chronic, stable. BP at goal in office today and on occasional office visits.  Recommend she monitor BP at least a few mornings a week at home and document.  DASH diet at home.  Continue current medication regimen and adjust as needed.  Labs: CBC, CMP, urine ALB.  Return in 6 months.

## 2022-08-08 NOTE — Assessment & Plan Note (Signed)
Chronic, stable.  Continue current medication regimen as recommended by neuro and collaboration with them.  CMP and Keppra level today.  Recent neuro note reviewed.

## 2022-08-08 NOTE — Progress Notes (Signed)
BP 124/73 (BP Location: Left Arm, Cuff Size: Normal)   Pulse 65   Temp 97.9 F (36.6 C) (Oral)   Wt 186 lb 9.6 oz (84.6 kg)   SpO2 94%   BMI 32.03 kg/m    Subjective:    Patient ID: Rachel Cox, female    DOB: 09/14/1950, 72 y.o.   MRN: [id number]  HPI: Rachel Cox is a 72 y.o. female  Chief Complaint  Patient presents with   Depression   Hyperlipidemia   Hypertension   Gastroesophageal Reflux   Chronic Kidney Disease   Seizures   HYPERTENSION / HYPERLIPIDEMIA Taking Losartan 100 MG daily, HCTZ 12.5 MG daily, and Atorvastatin 40 MG daily.  Has eye exam every 4 months ago and diagnosed with open-angle glaucoma, bilateral -- no recent changes.   Satisfied with current treatment? yes Duration of hypertension: chronic BP monitoring frequency: once a week BP range: 120/70 range BP medication side effects: yes Duration of hyperlipidemia: chronic Cholesterol medication side effects: no Cholesterol supplements: none Medication compliance: good compliance Aspirin: no Recent stressors: no Recurrent headaches: no Visual changes: no Palpitations: no Dyspnea: no Chest pain: no Lower extremity edema: no Dizzy/lightheaded: no   CHRONIC KIDNEY DISEASE Recent labs have been stable.  Has underlying osteopenia noted on DEXA August 2020, continues on daily supplements.  No recent falls or fractures. CKD status: stable Medications renally dose: yes Previous renal evaluation: no Pneumovax:  Up to Date Influenza Vaccine:  Up to Date   SEIZURE DISORDER Followed by neurology, recently saw on 05/07/22 -- no changes made at visit.  Has had no seizure since 2016.  Keppra 250 MG at bedtime -- level in March 2023 was 7.1.     HYPOTHYROIDISM Followed by Dr. Lissa Merlin and last seen 01/02/22 -- she would prefer PCP takeover treatment. Thyroid control status:stable Satisfied with current treatment? yes Medication side effects: no Medication compliance: good compliance Etiology of  hypothyroidism:  Recent dose adjustment:no Fatigue: no Cold intolerance: no Heat intolerance: no Weight gain: no Weight loss: no Constipation: no Diarrhea/loose stools: no Palpitations: no Lower extremity edema: no Anxiety/depressed mood: no    ANXIETY/STRESS Continues on Lexapro and Remeron 30 MG for mood and insomnia. Duration:stable Anxious mood: no  Excessive worrying: no Irritability: no  Sweating: no Nausea: no Palpitations:no Hyperventilation: no Panic attacks: no Agoraphobia: no  Obscessions/compulsions: no Depressed mood: no    08/08/2022    9:44 AM 07/22/2022    3:04 PM 02/07/2022    8:05 AM 08/06/2021    8:23 AM 08/06/2021    8:22 AM  Depression screen PHQ 2/9  Decreased Interest 1 0 0 0 0  Down, Depressed, Hopeless 0 0 0 0 0  PHQ - 2 Score 1 0 0 0 0  Altered sleeping 1 0 0 0   Tired, decreased energy 0 0 0 0   Change in appetite 0 0 0 0   Feeling bad or failure about yourself  0 0 0 0   Trouble concentrating 0 0 0 0   Moving slowly or fidgety/restless 0 0 0 0   Suicidal thoughts 0 0 0 0   PHQ-9 Score 2 0 0 0   Difficult doing work/chores Not difficult at all Not difficult at all Not difficult at all Not difficult at all       08/08/2022    9:44 AM 02/07/2022    8:05 AM 08/06/2021    8:23 AM 02/02/2020    8:21 AM  GAD 7 :  Generalized Anxiety Score  Nervous, Anxious, on Edge 0 0 0 0  Control/stop worrying 0 0 0 0  Worry too much - different things 0 0 0 0  Trouble relaxing 0 0 0 0  Restless 0 0 0 0  Easily annoyed or irritable 0 0 0 0  Afraid - awful might happen 0 0 0 0  Total GAD 7 Score 0 0 0 0  Anxiety Difficulty Not difficult at all Not difficult at all Not difficult at all Not difficult at all      Relevant past medical, surgical, family and social history reviewed and updated as indicated. Interim medical history since our last visit reviewed. Allergies and medications reviewed and updated.  Review of Systems  Constitutional:  Negative for  activity change, appetite change, diaphoresis, fatigue and fever.  Respiratory:  Negative for cough, chest tightness, shortness of breath and wheezing.   Cardiovascular:  Negative for chest pain, palpitations and leg swelling.  Gastrointestinal: Negative.   Endocrine: Negative for cold intolerance and heat intolerance.  Neurological: Negative.   Psychiatric/Behavioral: Negative.      Per HPI unless specifically indicated above     Objective:    BP 124/73 (BP Location: Left Arm, Cuff Size: Normal)   Pulse 65   Temp 97.9 F (36.6 C) (Oral)   Wt 186 lb 9.6 oz (84.6 kg)   SpO2 94%   BMI 32.03 kg/m   Wt Readings from Last 3 Encounters:  08/08/22 186 lb 9.6 oz (84.6 kg)  07/22/22 190 lb (86.2 kg)  02/07/22 190 lb 11.2 oz (86.5 kg)    Physical Exam Vitals and nursing note reviewed.  Constitutional:      General: She is awake. She is not in acute distress.    Appearance: She is well-developed and overweight. She is not ill-appearing.  HENT:     Head: Normocephalic.     Right Ear: Hearing normal.     Left Ear: Hearing normal.  Eyes:     General: Lids are normal.        Right eye: No discharge.        Left eye: No discharge.     Conjunctiva/sclera: Conjunctivae normal.     Pupils: Pupils are equal, round, and reactive to light.  Neck:     Thyroid: No thyromegaly.     Vascular: No carotid bruit.  Cardiovascular:     Rate and Rhythm: Normal rate and regular rhythm.     Heart sounds: Normal heart sounds. No murmur heard.    No gallop.  Pulmonary:     Effort: Pulmonary effort is normal. No accessory muscle usage or respiratory distress.     Breath sounds: Normal breath sounds.  Abdominal:     General: Bowel sounds are normal.     Palpations: Abdomen is soft. There is no hepatomegaly or splenomegaly.  Musculoskeletal:     Cervical back: Normal range of motion and neck supple.     Right lower leg: No edema.     Left lower leg: No edema.  Skin:    General: Skin is warm and  dry.  Neurological:     Mental Status: She is alert and oriented to person, place, and time.  Psychiatric:        Attention and Perception: Attention normal.        Mood and Affect: Mood normal.        Speech: Speech normal.        Behavior: Behavior normal. Behavior is  cooperative.     Results for orders placed or performed in visit on 03/04/22  Potassium  Result Value Ref Range   Potassium 3.5 3.5 - 5.2 mmol/L      Assessment & Plan:   Problem List Items Addressed This Visit       Cardiovascular and Mediastinum   Essential hypertension    Chronic, stable. BP at goal in office today and on occasional office visits.  Recommend she monitor BP at least a few mornings a week at home and document.  DASH diet at home.  Continue current medication regimen and adjust as needed.  Labs: CBC, CMP, urine ALB.  Return in 6 months.       Relevant Orders   Comprehensive metabolic panel   Microalbumin, Urine Waived     Endocrine   Hypothyroidism    Chronic, ongoing.  Continue current medication regimen as recommended by endocrinology.  Will take over monitoring thyroid in PCP office per patient request and return to endo as needed.  Thyroid labs today.      Relevant Orders   T4, free   TSH   Thyroid nodule    Chronic, ongoing.  Continue current medication regimen as ordered by endocrinology.  Will take over regimen here in office and return to specialist as needed.        Nervous and Auditory   Epilepsy ([place]) - Primary    Chronic, stable.  Continue current medication regimen as recommended by neuro and collaboration with them.  CMP and Keppra level today.  Recent neuro note reviewed.      Relevant Orders   Levetiracetam level     Musculoskeletal and Integument   Osteopenia (Chronic)    Ongoing with DEXA last in 2020 -- repeat in September 2025.  Continue Vit D daily and recommend fall precautions.  Check Vit D level today.        Genitourinary   CKD (chronic kidney  disease) stage 3, GFR 30-59 ml/min (HCC) (Chronic)    Chronic, stable.  Avoid NSAIDs.  Continue Losartan for kidney protection.  Labs: CMP and urine ALB today.  Consider nephrology referral if any decline.      Relevant Orders   Comprehensive metabolic panel   CBC with Differential/Platelet   Microalbumin, Urine Waived     Other   Depression, major, single episode, mild (HCC)    Chronic, ongoing.  Continue current medication regimen, Remeron and Lexapro, and adjust as needed -- currently followed by neurology for this.  Denies SI/HI.  Return in 6 months for follow-up.      Hyperlipemia    Chronic, ongoing.  Continue current medication regimen and adjust as needed.  Lipid panel today. Return in 6 months.      Relevant Orders   Comprehensive metabolic panel   Lipid Panel w/o Chol/HDL Ratio   Insomnia secondary to anxiety    Chronic, ongoing.  Continue current medication regimen, Remeron, and adjust as needed.        Microalbuminuria    Continue Losartan for kidney protection, check urine ALB today.      Relevant Orders   Microalbumin, Urine Waived   Obesity    BMI 32.03.  Recommended eating smaller high protein, low fat meals more frequently and exercising 30 mins a day 5 times a week with a goal of 10-15lb weight loss in the next 3 months. Patient voiced their understanding and motivation to adhere to these recommendations.       Primary open-angle glaucoma,  bilateral, mild stage    Followed by Dr. Gloriann Loan, continue this collaboration and recommend avoiding Flonase products.      Vitamin D deficiency    Chronic, ongoing.  Continue daily supplement and adjust as needed.  Last DEXA 2020.  Check Vit D level.      Relevant Orders   VITAMIN D 25 Hydroxy (Vit-D Deficiency, Fractures)     Follow up plan: Return in about 6 months (around 02/08/2023) for Annual physical after 02/08/23.

## 2022-08-08 NOTE — Assessment & Plan Note (Signed)
Ongoing with DEXA last in 2020 -- repeat in September 2025.  Continue Vit D daily and recommend fall precautions.  Check Vit D level today.

## 2022-08-08 NOTE — Assessment & Plan Note (Signed)
Chronic, ongoing.  Continue daily supplement and adjust as needed.  Last DEXA 2020.  Check Vit D level.

## 2022-08-08 NOTE — Assessment & Plan Note (Signed)
Chronic, ongoing.  Continue current medication regimen, Remeron and Lexapro, and adjust as needed -- currently followed by neurology for this.  Denies SI/HI.  Return in 6 months for follow-up.

## 2022-08-08 NOTE — Assessment & Plan Note (Signed)
BMI 32.03.  Recommended eating smaller high protein, low fat meals more frequently and exercising 30 mins a day 5 times a week with a goal of 10-15lb weight loss in the next 3 months. Patient voiced their understanding and motivation to adhere to these recommendations.

## 2022-08-08 NOTE — Assessment & Plan Note (Signed)
Chronic, ongoing.  Continue current medication regimen as ordered by endocrinology.  Will take over regimen here in office and return to specialist as needed.

## 2022-08-08 NOTE — Assessment & Plan Note (Signed)
Chronic, ongoing.  Continue current medication regimen and adjust as needed.  Lipid panel today. Return in 6 months.

## 2022-08-09 NOTE — Progress Notes (Signed)
Contacted via [place] morning Rachel Cox, your labs have returned and overall these remain stable for you: - Kidney function, creatinine and eGFR, continues to show chronic kidney disease stage 3a with no worsening.  Ensure plenty of water intake daily and no Ibuprofen use. - Liver function, AST and ALT, is normal. - Thyroid levels normal and remainder of labs look great.  No medication changes needed.  Keep up the great work!!  Any questions? Keep being amazing!!  Thank you for allowing me to participate in your care.  I appreciate you. Kindest regards, Timarie Labell

## 2022-08-11 LAB — COMPREHENSIVE METABOLIC PANEL
ALT: 14 IU/L (ref 0–32)
AST: 20 IU/L (ref 0–40)
Albumin/Globulin Ratio: 1.7 (ref 1.2–2.2)
Albumin: 4.3 g/dL (ref 3.8–4.8)
Alkaline Phosphatase: 97 IU/L (ref 44–121)
BUN/Creatinine Ratio: 15 (ref 12–28)
BUN: 17 mg/dL (ref 8–27)
Bilirubin Total: 0.5 mg/dL (ref 0.0–1.2)
CO2: 21 mmol/L (ref 20–29)
Calcium: 9.6 mg/dL (ref 8.7–10.3)
Chloride: 101 mmol/L (ref 96–106)
Creatinine, Ser: 1.11 mg/dL — ABNORMAL HIGH (ref 0.57–1.00)
Globulin, Total: 2.6 g/dL (ref 1.5–4.5)
Glucose: 93 mg/dL (ref 70–99)
Potassium: 3.7 mmol/L (ref 3.5–5.2)
Sodium: 137 mmol/L (ref 134–144)
Total Protein: 6.9 g/dL (ref 6.0–8.5)
eGFR: 52 mL/min/{1.73_m2} — ABNORMAL LOW (ref >59)

## 2022-08-11 LAB — LEVETIRACETAM LEVEL: Levetiracetam Lvl: 6.6 ug/mL — ABNORMAL LOW (ref 10.0–40.0)

## 2022-08-11 LAB — CBC WITH DIFFERENTIAL/PLATELET
Basophils Absolute: 0 10*3/uL (ref 0.0–0.2)
Basos: 1 %
EOS (ABSOLUTE): 0.1 10*3/uL (ref 0.0–0.4)
Eos: 2 %
Hematocrit: 42.5 % (ref 34.0–46.6)
Hemoglobin: 13.4 g/dL (ref 11.1–15.9)
Immature Grans (Abs): 0 10*3/uL (ref 0.0–0.1)
Immature Granulocytes: 0 %
Lymphocytes Absolute: 2 10*3/uL (ref 0.7–3.1)
Lymphs: 39 %
MCH: 27.7 pg (ref 26.6–33.0)
MCHC: 31.5 g/dL (ref 31.5–35.7)
MCV: 88 fL (ref 79–97)
Monocytes Absolute: 0.6 10*3/uL (ref 0.1–0.9)
Monocytes: 12 %
Neutrophils Absolute: 2.4 10*3/uL (ref 1.4–7.0)
Neutrophils: 46 %
Platelets: 271 10*3/uL (ref 150–450)
RBC: 4.84 x10E6/uL (ref 3.77–5.28)
RDW: 14.3 % (ref 11.7–15.4)
WBC: 5.2 10*3/uL (ref 3.4–10.8)

## 2022-08-11 LAB — VITAMIN D 25 HYDROXY (VIT D DEFICIENCY, FRACTURES): Vit D, 25-Hydroxy: 32.4 ng/mL (ref 30.0–100.0)

## 2022-08-11 LAB — LIPID PANEL W/O CHOL/HDL RATIO
Cholesterol, Total: 181 mg/dL (ref 100–199)
HDL: 75 mg/dL (ref >39)
LDL Chol Calc (NIH): 83 mg/dL (ref 0–99)
Triglycerides: 134 mg/dL (ref 0–149)
VLDL Cholesterol Cal: 23 mg/dL (ref 5–40)

## 2022-08-11 LAB — T4, FREE: Free T4: 1.56 ng/dL (ref 0.82–1.77)

## 2022-08-11 LAB — TSH: TSH: 2.32 u[IU]/mL (ref 0.450–4.500)

## 2022-09-09 DIAGNOSIS — K209 Esophagitis, unspecified without bleeding: Secondary | ICD-10-CM | POA: Diagnosis not present

## 2022-09-09 DIAGNOSIS — Z83719 Family history of colon polyps, unspecified: Secondary | ICD-10-CM | POA: Diagnosis not present

## 2022-09-10 DIAGNOSIS — H40153 Residual stage of open-angle glaucoma, bilateral: Secondary | ICD-10-CM | POA: Diagnosis not present

## 2022-09-12 ENCOUNTER — Encounter: Payer: Self-pay | Admitting: Oncology

## 2022-09-18 ENCOUNTER — Ambulatory Visit
Admission: RE | Admit: 2022-09-18 | Discharge: 2022-09-18 | Disposition: A | Discharge status: Home / Self Care | Payer: PPO | Attending: Surgery | Admitting: Surgery

## 2022-09-18 ENCOUNTER — Ambulatory Visit: Payer: PPO | Admitting: Certified Registered"

## 2022-09-18 ENCOUNTER — Encounter: Payer: Self-pay | Admitting: Surgery

## 2022-09-18 ENCOUNTER — Encounter
Admission: RE | Disposition: A | Discharge status: Home / Self Care | Payer: Self-pay | Source: Home / Self Care | Attending: Surgery

## 2022-09-18 DIAGNOSIS — D631 Anemia in chronic kidney disease: Secondary | ICD-10-CM | POA: Diagnosis not present

## 2022-09-18 DIAGNOSIS — K449 Diaphragmatic hernia without obstruction or gangrene: Secondary | ICD-10-CM | POA: Diagnosis not present

## 2022-09-18 DIAGNOSIS — G40909 Epilepsy, unspecified, not intractable, without status epilepticus: Secondary | ICD-10-CM | POA: Diagnosis not present

## 2022-09-18 DIAGNOSIS — Z1211 Encounter for screening for malignant neoplasm of colon: Secondary | ICD-10-CM | POA: Diagnosis not present

## 2022-09-18 DIAGNOSIS — I10 Essential (primary) hypertension: Secondary | ICD-10-CM | POA: Insufficient documentation

## 2022-09-18 DIAGNOSIS — K2970 Gastritis, unspecified, without bleeding: Secondary | ICD-10-CM | POA: Insufficient documentation

## 2022-09-18 DIAGNOSIS — K2100 Gastro-esophageal reflux disease with esophagitis, without bleeding: Secondary | ICD-10-CM | POA: Insufficient documentation

## 2022-09-18 DIAGNOSIS — M8580 Other specified disorders of bone density and structure, unspecified site: Secondary | ICD-10-CM | POA: Diagnosis not present

## 2022-09-18 DIAGNOSIS — K209 Esophagitis, unspecified without bleeding: Secondary | ICD-10-CM | POA: Diagnosis not present

## 2022-09-18 DIAGNOSIS — Z83719 Family history of colon polyps, unspecified: Secondary | ICD-10-CM | POA: Insufficient documentation

## 2022-09-18 DIAGNOSIS — K31A0 Gastric intestinal metaplasia, unspecified: Secondary | ICD-10-CM | POA: Insufficient documentation

## 2022-09-18 DIAGNOSIS — E785 Hyperlipidemia, unspecified: Secondary | ICD-10-CM | POA: Diagnosis not present

## 2022-09-18 DIAGNOSIS — Z79899 Other long term (current) drug therapy: Secondary | ICD-10-CM | POA: Insufficient documentation

## 2022-09-18 DIAGNOSIS — K644 Residual hemorrhoidal skin tags: Secondary | ICD-10-CM | POA: Diagnosis not present

## 2022-09-18 DIAGNOSIS — E039 Hypothyroidism, unspecified: Secondary | ICD-10-CM | POA: Diagnosis not present

## 2022-09-18 DIAGNOSIS — I129 Hypertensive chronic kidney disease with stage 1 through stage 4 chronic kidney disease, or unspecified chronic kidney disease: Secondary | ICD-10-CM | POA: Diagnosis not present

## 2022-09-18 DIAGNOSIS — N183 Chronic kidney disease, stage 3 unspecified: Secondary | ICD-10-CM | POA: Diagnosis not present

## 2022-09-18 HISTORY — PX: COLONOSCOPY WITH PROPOFOL: SHX5780

## 2022-09-18 HISTORY — PX: ESOPHAGOGASTRODUODENOSCOPY (EGD) WITH PROPOFOL: SHX5813

## 2022-09-18 SURGERY — COLONOSCOPY WITH PROPOFOL
Anesthesia: General

## 2022-09-18 MED ORDER — GLYCOPYRROLATE 0.2 MG/ML IJ SOLN
INTRAMUSCULAR | Status: DC | PRN
  Administered 2022-09-18: .2 mg via INTRAVENOUS

## 2022-09-18 MED ORDER — SODIUM CHLORIDE 0.9 % IV SOLN: INTRAVENOUS | Status: DC

## 2022-09-18 MED ORDER — PROPOFOL 10 MG/ML IV BOLUS
INTRAVENOUS | Status: DC | PRN
  Administered 2022-09-18: 80 mg via INTRAVENOUS

## 2022-09-18 MED ORDER — PROPOFOL 1000 MG/100ML IV EMUL
INTRAVENOUS | Status: AC
  Filled 2022-09-18: qty 100

## 2022-09-18 MED ORDER — PROPOFOL 500 MG/50ML IV EMUL
INTRAVENOUS | Status: DC | PRN
  Administered 2022-09-18: 140 ug/kg/min via INTRAVENOUS

## 2022-09-18 MED ORDER — STERILE WATER FOR IRRIGATION IR SOLN
Status: DC | PRN
  Administered 2022-09-18: 60 mL

## 2022-09-18 MED ORDER — LIDOCAINE HCL (CARDIAC) PF 100 MG/5ML IV SOSY
PREFILLED_SYRINGE | INTRAVENOUS | Status: DC | PRN
  Administered 2022-09-18: 80 mg via INTRAVENOUS

## 2022-09-18 MED ORDER — PHENYLEPHRINE 80 MCG/ML (10ML) SYRINGE FOR IV PUSH (FOR BLOOD PRESSURE SUPPORT)
PREFILLED_SYRINGE | INTRAVENOUS | Status: DC | PRN
  Administered 2022-09-18 (×2): 80 ug via INTRAVENOUS

## 2022-09-18 NOTE — H&P (Signed)
Subjective:  CC: Esophagitis [K20.90]  HPI: Rachel Cox is a 72 y.o. female who presents for evaluation of above. Family hx of colon polyps as well.. instructed EGD and colonoscopy due.  Still has GERD symptoms but tolerable with PPI  Past Medical History: has a past medical history of Allergic rhinitis, Anemia, B12 deficiency (11/30/2013), Epilepsy (CMS/HHS-HCC), Hyperlipemia (11/30/2013), Hypertension, Hypothyroidism (11/30/2013), Insomnia, Iron deficiency (11/30/2013), Osteopenia, Reflux esophagitis, Scarlet fever, and Seizures (CMS/HHS-HCC).  Past Surgical History: has a past surgical history that includes Colonoscopy (07/26/2005); Essure tubal ligation; Colonoscopy (02/14/2014 [facility]); Cataract extraction (Bilateral); lipoma excision (12/12/2017); egd (10/29/2017); Colonoscopy (09/10/2017); cryo procedure; and egd (09/10/2017).  Family History: family history includes COPD in her father; Colon cancer in her maternal grandmother; Colon polyps in her brother; Coronary Artery Disease (Blocked arteries around heart) in her mother; Heart disease in her father and mother; High blood pressure (Hypertension) in her brother and mother; Liver disease in her brother; Thyroid disease in her father, mother, and sister.  Social History: reports that she has quit smoking. She has never used smokeless tobacco. She reports that she does not drink alcohol and does not use drugs.  Current Medications: has a current medication list which includes the following prescription(s): atorvastatin, cholecalciferol, cyanocobalamin (vitamin b-12), dorzolamide, folic acid, hydrochlorothiazide, levetiracetam, levothyroxine, losartan, mirtazapine, pantoprazole, escitalopram oxalate, fluticasone propionate, losartan-hydrochlorothiazide, and losartan-hydrochlorothiazide.  Allergies: Allergies Allergen Reactions Aspirin Other (See Comments) GI Upset Codeine Sulfate Palpitations Fluticasone Propionate Unknown Make glaucoma  worse  ROS: A 15 point review of systems was performed and pertinent positives and negatives noted in HPI  Objective:   BP (!) 156/93  Pulse 77  Ht 167.6 cm ( )  Wt 84.6 kg (186 lb 6.4 oz)  LMP (LMP Unknown)  BMI 30.09 kg/m  Constitutional : No distress, cooperative, alert Lymphatics/Throat: Supple with no lymphadenopathy Respiratory: Clear to auscultation bilaterally Cardiovascular: Regular rate and rhythm Gastrointestinal: Soft, non-tender, non-distended, no organomegaly. Musculoskeletal: Steady gait and movement Skin: Cool and moist, Psychiatric: Normal affect, non-agitated, not confused    LABS: N/A  RADS: N/A  Assessment:   Esophagitis [K20.90] Family hx of polyps  Will proceed with EGD/colonoscopy  Plan:   1.R/b/a discussed. Risks include bleeding, perforation. Benefits include diagnostic, curative procedure if needed. Alternatives include continued observation. Pt verbalized understanding.  Procedure will be scheduled.  labs/images/medications/previous chart entries reviewed personally and relevant changes/updates noted above.

## 2022-09-18 NOTE — Anesthesia Preprocedure Evaluation (Signed)
Anesthesia Evaluation  Patient identified by MRN, date of birth, ID band Patient awake    Reviewed: Allergy & Precautions, NPO status , Patient's Chart, lab work & pertinent test results  Airway Mallampati: II  TM Distance: >3 FB Neck ROM: full    Dental  (+) Upper Dentures, Lower Dentures   Pulmonary neg pulmonary ROS   Pulmonary exam normal breath sounds clear to auscultation       Cardiovascular Exercise Tolerance: Good hypertension, Pt. on medications negative cardio ROS Normal cardiovascular exam Rhythm:Regular Rate:Normal     Neuro/Psych Seizures -, Well Controlled,   Anxiety     negative neurological ROS  negative psych ROS   GI/Hepatic negative GI ROS, Neg liver ROS,GERD  Medicated,,  Endo/Other  negative endocrine ROSHypothyroidism    Renal/GU negative Renal ROS  negative genitourinary   Musculoskeletal   Abdominal Normal abdominal exam  (+)   Peds negative pediatric ROS (+)  Hematology negative hematology ROS (+) Blood dyscrasia, anemia   Anesthesia Other Findings Past Medical History: No date: Allergic rhinitis No date: Allergy No date: Anemia No date: Anxiety No date: Depression No date: Epilepsy     Comment:  managed by Dr. Eliane Decree 2015/2016 No date: GERD (gastroesophageal reflux disease) 1980's: History of abnormal cervical Pap smear No date: Hyperglycemia No date: Hyperlipidemia No date: Hypertension No date: Hypothyroidism No date: Insomnia 01/06/2017: Obesity (BMI 30.0-34.9) No date: Osteopenia 10/31/2017: Reflux esophagitis     Comment:  EGD  Past Surgical History: Sept 2015: COLONOSCOPY 09/10/2017: COLONOSCOPY WITH PROPOFOL; N/A     Comment:  Procedure: COLONOSCOPY WITH PROPOFOL;  Surgeon: Earline Mayotte, MD;  Location: [facility] ENDOSCOPY;  Service:               Endoscopy;  Laterality: N/A; 1980s: Cryo Procedure     Comment:  for abnormal pap 09/10/2017:  ESOPHAGOGASTRODUODENOSCOPY (EGD) WITH PROPOFOL; N/A     Comment:  Procedure: ESOPHAGOGASTRODUODENOSCOPY (EGD) WITH               PROPOFOL;  Surgeon: Earline Mayotte, MD;  Location:               [facility] ENDOSCOPY;  Service: Endoscopy;  Laterality: N/A; 10/29/2017: ESOPHAGOGASTRODUODENOSCOPY (EGD) WITH PROPOFOL; N/A     Comment:  Procedure: ESOPHAGOGASTRODUODENOSCOPY (EGD) WITH               PROPOFOL;  Surgeon: Earline Mayotte, MD;  Location:               [facility] ENDOSCOPY;  Service: Endoscopy;  Laterality: N/A; 2021: EYE SURGERY; Bilateral     Comment:  cataract removal 12/12/2017: LIPOMA EXCISION; N/A     Comment:  Procedure: EXCISION BACK LIPOMA;  Surgeon: Earline Mayotte, MD;  Location: [facility] ORS;  Service: General;                Laterality: N/A; 1969: TUBAL LIGATION  BMI    Body Mass Index: 31.51 kg/m      Reproductive/Obstetrics negative OB ROS                             Anesthesia Physical Anesthesia Plan  ASA: 3  Anesthesia Plan: General   Post-op Pain Management:    Induction: Intravenous  PONV Risk Score and Plan:  Propofol infusion and TIVA  Airway Management Planned: Natural Airway  Additional Equipment:   Intra-op Plan:   Post-operative Plan:   Informed Consent: I have reviewed the patients History and Physical, chart, labs and discussed the procedure including the risks, benefits and alternatives for the proposed anesthesia with the patient or authorized representative who has indicated his/her understanding and acceptance.     Dental Advisory Given  Plan Discussed with: CRNA and Surgeon  Anesthesia Plan Comments:        Anesthesia Quick Evaluation

## 2022-09-18 NOTE — Op Note (Signed)
[facility] Gastroenterology Patient Name: Rachel Cox Procedure Date: 09/18/2022 7:12 AM MRN: [id number] Account #: [id number] Date of Birth: Dec 29, 1950 Admit Type: Outpatient Age: 72 Room: [facility] ENDO ROOM 1 Gender: Female Note Status: Finalized Instrument Name: Peds Colonoscope [id number] Procedure:             Colonoscopy Indications:           Colon cancer screening in patient at increased risk:                         Family history of 1st-degree relative with colon polyps Providers:             Sung Amabile MD, MD Referring MD:          Dorie Rank. Cannady (Referring MD) Medicines:             Propofol per Anesthesia Complications:         No immediate complications. Procedure:             Pre-Anesthesia Assessment:                        - After reviewing the risks and benefits, the patient                         was deemed in satisfactory condition to undergo the                         procedure in an ambulatory setting.                        After obtaining informed consent, the colonoscope was                         passed under direct vision. Throughout the procedure,                         the patient's blood pressure, pulse, and oxygen                         saturations were monitored continuously. The                         Colonoscope was introduced through the anus and                         advanced to the the cecum, identified by the ileocecal                         valve. The colonoscopy was somewhat difficult due to                         significant looping. Successful completion of the                         procedure was aided by applying abdominal pressure.                         The quality of the bowel preparation was good. Findings:      Skin tags were found on perianal exam.  The exam was otherwise without abnormality. Impression:            - Perianal skin tags found on perianal exam.                        - The examination  was otherwise normal.                        - No specimens collected. Recommendation:        - Discharge patient to home.                        - Resume previous diet.                        - Written discharge instructions were provided to the                         patient.                        - Repeat colonoscopy in 7-10 years for screening                         purposes. Procedure Code(s):     --- Professional ---                        Z6109, Colorectal cancer screening; colonoscopy on                         individual at high risk Diagnosis Code(s):     --- Professional ---                        Z83.71, Family history of colonic polyps                        K64.4, Residual hemorrhoidal skin tags CPT copyright 2022 American [employer] Association. All rights reserved. The codes documented in this report are preliminary and upon coder review may  be revised to meet current compliance requirements. Dr. Harrie Foreman, MD Sung Amabile MD, MD 09/18/2022 8:18:55 AM This report has been signed electronically. Number of Addenda: 0 Note Initiated On: 09/18/2022 7:12 AM Scope Withdrawal Time: 0 hours 9 minutes 12 seconds  Total Procedure Duration: 0 hours 18 minutes 34 seconds  Estimated Blood Loss:  Estimated blood loss: none.      [facility]

## 2022-09-18 NOTE — Anesthesia Postprocedure Evaluation (Signed)
Anesthesia Post Note  Patient: NASHIRA MCGLYNN  Procedure(s) Performed: COLONOSCOPY WITH PROPOFOL ESOPHAGOGASTRODUODENOSCOPY (EGD) WITH PROPOFOL  Patient location during evaluation: PACU Anesthesia Type: General Level of consciousness: awake and oriented Pain management: satisfactory to patient Vital Signs Assessment: post-procedure vital signs reviewed and stable Respiratory status: nonlabored ventilation and respiratory function stable Cardiovascular status: blood pressure returned to baseline Anesthetic complications: no   No notable events documented.   Last Vitals:  Vitals:   09/18/22 0817 09/18/22 0832  BP: 97/73 (!) 111/91  Pulse: 89 85  Resp: 16 18  Temp:    SpO2: 96% 97%    Last Pain:  Vitals:   09/18/22 0817  TempSrc:   PainSc: 0-No pain                 VAN STAVEREN,Tyonna Talerico

## 2022-09-18 NOTE — Transfer of Care (Signed)
Immediate Anesthesia Transfer of Care Note  Patient: Rachel Cox  Procedure(s) Performed: COLONOSCOPY WITH PROPOFOL ESOPHAGOGASTRODUODENOSCOPY (EGD) WITH PROPOFOL  Patient Location: Endoscopy Unit  Anesthesia Type:General  Level of Consciousness: drowsy  Airway & Oxygen Therapy: Patient Spontanous Breathing  Post-op Assessment: Report given to RN  Post vital signs: stable  Last Vitals:  Vitals Value Taken Time  BP 75/58 09/18/22 0812  Temp 35.9 C 09/18/22 0812  Pulse 70 09/18/22 0812  Resp 12 09/18/22 0812  SpO2 94 % 09/18/22 0812    Last Pain:  Vitals:   09/18/22 0812  TempSrc: Temporal  PainSc: Asleep         Complications: No notable events documented.

## 2022-09-18 NOTE — Op Note (Signed)
[facility] Gastroenterology Patient Name: Rachel Cox Procedure Date: 09/18/2022 7:14 AM MRN: [id number] Account #: [id number] Date of Birth: 05/17/1951 Admit Type: Outpatient Age: 72 Room: [facility] ENDO ROOM 1 Gender: Female Note Status: Finalized Instrument Name: Upper Endoscope [id number] Procedure:             Upper GI endoscopy Indications:           Heartburn Providers:             Sung Amabile MD, MD Referring MD:          Dorie Rank. Cannady (Referring MD) Medicines:             Propofol per Anesthesia Complications:         No immediate complications. Procedure:             Pre-Anesthesia Assessment:                        - After reviewing the risks and benefits, the patient                         was deemed in satisfactory condition to undergo the                         procedure in an ambulatory setting.                        - After reviewing the risks and benefits, the patient                         was deemed in satisfactory condition to undergo the                         procedure in an ambulatory setting.                        After obtaining informed consent, the endoscope was                         passed under direct vision. Throughout the procedure,                         the patient's blood pressure, pulse, and oxygen                         saturations were monitored continuously. The                         Endosonoscope was introduced through the mouth, and                         advanced to the second part of duodenum. The upper GI                         endoscopy was accomplished without difficulty. The                         patient tolerated the procedure well. Findings:      The examined duodenum was normal.      Scattered mild inflammation was found in  the gastric body. Biopsies were       taken with a cold forceps for histology. Estimated blood loss was       minimal.      A medium-sized hiatal hernia was present.      LA  Grade A (one or more mucosal breaks less than 5 mm, not extending       between tops of 2 mucosal folds) esophagitis with no bleeding was found       20 to 24 cm from the incisors. Biopsies were taken with a cold forceps       for histology. Estimated blood loss was minimal. Impression:            - Normal examined duodenum.                        - Gastritis. Biopsied.                        - Medium-sized hiatal hernia.                        - LA Grade A esophagitis with no bleeding. Biopsied. Recommendation:        - Written discharge instructions were provided to the                         patient.                        - Discharge patient to home.                        - Resume previous diet.                        - Await pathology results. Procedure Code(s):     --- Professional ---                        [phone], Esophagogastroduodenoscopy, flexible,                         transoral; with biopsy, single or multiple Diagnosis Code(s):     --- Professional ---                        K29.70, Gastritis, unspecified, without bleeding                        K44.9, Diaphragmatic hernia without obstruction or                         gangrene                        K20.90, Esophagitis, unspecified without bleeding                        R12, Heartburn CPT copyright 2022 American Medical Association. All rights reserved. The codes documented in this report are preliminary and upon coder review may  be revised to meet current compliance requirements. Dr. Harrie Foreman, MD Sung Amabile MD, MD 09/18/2022 8:16:23 AM This report has been signed electronically. Number of Addenda: 0 Note Initiated On: 09/18/2022 7:14  AM Estimated Blood Loss:  Estimated blood loss was minimal.      [facility]

## 2022-09-18 NOTE — Anesthesia Postprocedure Evaluation (Signed)
Anesthesia Post Note  Patient: Rachel Cox  Procedure(s) Performed: COLONOSCOPY WITH PROPOFOL ESOPHAGOGASTRODUODENOSCOPY (EGD) WITH PROPOFOL  Patient location during evaluation: PACU Anesthesia Type: General Level of consciousness: awake Pain management: pain level controlled Vital Signs Assessment: post-procedure vital signs reviewed and stable Respiratory status: spontaneous breathing Cardiovascular status: stable Anesthetic complications: no   No notable events documented.   Last Vitals:  Vitals:   09/18/22 0817 09/18/22 0832  BP: 97/73 (!) 111/91  Pulse: 89 85  Resp: 16 18  Temp:    SpO2: 96% 97%    Last Pain:  Vitals:   09/18/22 0817  TempSrc:   PainSc: 0-No pain                 VAN STAVEREN,Brenlyn Beshara

## 2022-09-19 ENCOUNTER — Encounter: Payer: Self-pay | Admitting: Surgery

## 2022-09-19 LAB — SURGICAL PATHOLOGY

## 2022-10-02 DIAGNOSIS — Z1231 Encounter for screening mammogram for malignant neoplasm of breast: Secondary | ICD-10-CM | POA: Diagnosis not present

## 2022-10-02 LAB — HM MAMMOGRAPHY

## 2022-10-03 ENCOUNTER — Encounter: Payer: Self-pay | Admitting: Nurse Practitioner

## 2022-10-04 ENCOUNTER — Encounter: Payer: Self-pay | Admitting: Oncology

## 2022-10-09 ENCOUNTER — Other Ambulatory Visit: Payer: Self-pay | Admitting: Nurse Practitioner

## 2022-10-09 NOTE — Telephone Encounter (Signed)
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 status epilepticus, not intractable (HCC)   [place] [facility] [place], [place] T, NP   8 months ago 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 status epilepticus, not intractable (HCC)   [place] [facility] [place], Corrie Dandy T, NP   1 year ago 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 status epilepticus, not intractable (HCC)   [place] [facility] [place], Corrie Dandy T, NP   1 year ago Stage 3a chronic kidney disease (HCC)   [place] [facility] [place], Corrie Dandy T, NP   1 year ago 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 status epilepticus, not intractable (HCC)   [place] [facility] [place], Dorie Rank, NP       Future Appointments             In 4 months Cannady, Dorie Rank, NP  [facility], [facility]

## 2022-10-24 ENCOUNTER — Other Ambulatory Visit: Payer: Self-pay | Admitting: Nurse Practitioner

## 2022-10-24 DIAGNOSIS — E039 Hypothyroidism, unspecified: Secondary | ICD-10-CM

## 2022-10-24 MED ORDER — LEVOTHYROXINE SODIUM 75 MCG PO TABS: 75.0000 ug | ORAL_TABLET | Freq: Every day | ORAL | 3 refills | Status: DC

## 2022-10-24 NOTE — Telephone Encounter (Signed)
Requested medication (s) are due for refill today:   Yes  Requested medication (s) are on the active medication list:   Yes  Future visit scheduled:   Yes 02/12/2023   Last ordered: 10/11/2021 #90, 3 refills  Returned because last ordered by Dr. Lucianne Muss.   Requested Prescriptions  Pending Prescriptions Disp Refills   levothyroxine (SYNTHROID) 75 MCG tablet 90 tablet 3    Sig: Take 1 tablet (75 mcg total) by mouth daily. Take one tablet every morning     Endocrinology:  Hypothyroid Agents Passed - 10/24/2022 12:41 PM      Passed - TSH in normal range and within 360 days    TSH  Date Value Ref Range Status  08/08/2022 2.320 0.450 - 4.500 uIU/mL Final         Passed - Valid encounter within last 12 months    Recent Outpatient Visits           2 months ago 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 status epilepticus, not intractable (HCC)   [facility] [place] [facility] [place], [place] T, NP   8 months ago 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 status epilepticus, not intractable (HCC)   [place] [facility] [place], Corrie Dandy T, NP   1 year ago 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 status epilepticus, not intractable (HCC)   [place] [facility] [place], Corrie Dandy T, NP   1 year ago Stage 3a chronic kidney disease (HCC)   [place] [facility] [place], Corrie Dandy T, NP   1 year ago 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 status epilepticus, not intractable (HCC)   [place] [facility] [place], Dorie Rank, NP       Future Appointments             In 3 months Cannady, Dorie Rank, NP [place] [facility], [facility]

## 2022-10-24 NOTE — Telephone Encounter (Signed)
Medication Refill - Medication: levothyroxine (SYNTHROID) 75 MCG tablet  Has the patient contacted their pharmacy? Yes.   Pt told to contact provider  Preferred Pharmacy (with phone number or street name):  [facility] [address] [place]), [place] - [address] Phone: [phone]  Fax: [phone]     Has the patient been seen for an appointment in the last year OR does the patient have an upcoming appointment? Yes.    Agent: Please be advised that RX refills may take up to 3 business days. We ask that you follow-up with your pharmacy.

## 2023-01-06 ENCOUNTER — Ambulatory Visit: Payer: PPO | Admitting: Endocrinology

## 2023-01-15 ENCOUNTER — Other Ambulatory Visit: Payer: Self-pay | Admitting: Nurse Practitioner

## 2023-01-17 NOTE — Telephone Encounter (Signed)
Requested Prescriptions  Pending Prescriptions Disp Refills   pantoprazole (PROTONIX) 40 MG tablet [Pharmacy Med Name: Pantoprazole Sodium 40 MG Oral Tablet Delayed Release] 90 tablet 2    Sig: Take 1 tablet by mouth once daily     Gastroenterology: Proton Pump Inhibitors Passed - 01/15/2023  2:52 PM      Passed - Valid encounter within last 12 months    Recent Outpatient Visits           5 months ago 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 status epilepticus, not intractable (HCC)   [place] [facility] [place], [place] T, NP   11 months ago 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 status epilepticus, not intractable (HCC)   [place] [facility] [place], Corrie Dandy T, NP   1 year ago 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 status epilepticus, not intractable (HCC)   [place] [facility] [place], Corrie Dandy T, NP   1 year ago Stage 3a chronic kidney disease (HCC)   [place] [facility] [place], Corrie Dandy T, NP   1 year ago 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 status epilepticus, not intractable (HCC)   [place] [facility] [place], Dorie Rank, NP       Future Appointments             In 3 weeks Cannady, Dorie Rank, NP [place] [facility], [facility]

## 2023-01-23 DIAGNOSIS — H40153 Residual stage of open-angle glaucoma, bilateral: Secondary | ICD-10-CM | POA: Diagnosis not present

## 2023-02-09 NOTE — Patient Instructions (Signed)
Be Involved in Caring For Your Health:  Taking Medications When medications are taken as directed, they can greatly improve your health. But if they are not taken as prescribed, they may not work. In some cases, not taking them correctly can be harmful. To help ensure your treatment remains effective and safe, understand your medications and how to take them. Bring your medications to each visit for review by your provider.  Your lab results, notes, and after visit summary will be available on My Chart. We strongly encourage you to use this feature. If lab results are abnormal the clinic will contact you with the appropriate steps. If the clinic does not contact you assume the results are satisfactory. You can always view your results on My Chart. If you have questions regarding your health or results, please contact the clinic during office hours. You can also ask questions on My Chart.  We at [facility] are grateful that you chose [place] to provide your care. We strive to provide evidence-based and compassionate care and are always looking for feedback. If you get a survey from the clinic please complete this so we can hear your opinions.  Food Basics for Chronic Kidney Disease Chronic kidney disease (CKD) is when your kidneys are not working well. They cannot remove waste, fluids, and other substances from your blood the way they should. These substances can build up, which can worsen kidney damage and affect how your body works. Eating certain foods can lead to a buildup of these substances. Changing your diet can help prevent more kidney damage. Diet changes may also delay dialysis or even keep you from needing it. What nutrients should I limit? Work with your treatment team and a food expert (dietitian) to make a meal plan that's right for you. Foods you can eat and foods you should limit or avoid will depend on the stage of your kidney disease and any other health conditions you have.  The items listed below are not a complete list. Talk with your dietitian to learn what is best for you. Potassium Potassium affects how steadily your heart beats. Too much potassium in your blood can cause an irregular heartbeat or even a heart attack. You may need to limit foods that are high in potassium, such as: Liquid milk and soy milk. Salt substitutes that contain potassium. Fruits like bananas, apricots, nectarines, melon, prunes, raisins, kiwi, and oranges. Vegetables, such as potatoes, sweet potatoes, yams, tomatoes, leafy greens, beets, avocado, pumpkin, and winter squash. Beans, like lima beans. Nuts. Phosphorus Phosphorus is a mineral found in your bones. You need a balance between calcium and phosphorus to build and maintain healthy bones. Too much added phosphorus from the foods you eat can pull calcium from your bones. Losing calcium can make your bones weak and more likely to break. Too much phosphorus can also make your skin itch. You may need to limit foods that are high in phosphorus or that have added phosphorus, such as: Liquid milk and dairy products. Dark-colored sodas or soft drinks. Bran cereals and oatmeal. Protein  Protein helps you make and keep muscle. Protein also helps to repair your body's cells and tissues. One of the natural breakdown products of protein is a waste product called urea. When your kidneys are not working well, they cannot remove types of waste like urea. Reducing protein in your diet can help keep urea from building up in your blood. Depending on your stage of kidney disease, you may need to  eat smaller portions of foods that are high in protein. Sources of animal protein include: Meat (all types). Fish and seafood. Poultry. Eggs. Dairy. Other protein foods include: Beans and legumes. Nuts and nut butter. Soy, like tofu.  Sodium Salt (sodium) helps to keep a healthy balance of fluids in your body. Too much salt can increase your blood  pressure, which can harm your heart and lungs. Extra salt can also cause your body to keep too much fluid, making your kidneys work harder. You may need to limit or avoid foods that are high in salt, such as: Salt seasonings. Soy and teriyaki sauce. Packaged, precooked, cured, or processed meats, such as sausages or meat loaves. Sardines. Salted crackers and snack foods. Fast food. Canned soups and most canned foods. Pickled foods. Vegetable juice. Boxed mixes or ready-to-eat boxed meals and side dishes. Bottled dressings, sauces, and marinades. Talk with your dietitian about how much potassium, phosphorus, protein, and salt you may have each day. Helpful tips Read food labels  Check the amount of salt in foods. Limit foods that have salt or sodium listed among the first five ingredients. Try to eat low-salt foods. Check the ingredient list for added phosphorus or potassium. "Phos" in an ingredient is a sign that phosphorus has been added. Do not buy foods that are calcium-enriched or that have calcium added to them (are fortified). Buy canned vegetables and beans that say "no salt added" and rinse them before eating. Lifestyle Limit the amount of protein you eat from animal sources each day. Focus on protein from plant sources, like tofu and dried beans, peas, and lentils. Do not add salt to food when cooking or before eating. Do not eat star fruit. It can be toxic for people with kidney problems. Talk with your health care provider before taking any vitamin or mineral supplements. If told by your health care provider, track how much liquid you drink so you can avoid drinking too much. You may need to include foods you eat that are made mostly from water, like gelatin, ice cream, soups, and juicy fruits and vegetables. If you have diabetes: If you have diabetes (diabetes mellitus) and CKD, you need to keep your blood sugar (glucose) in the target range recommended by your health care  provider. Follow your diabetes management plan. This may include: Checking your blood glucose regularly. Taking medicines by mouth, or taking insulin, or both. Exercising for at least 30 minutes on 5 or more days each week, or as told by your health care provider. Tracking how many servings of carbohydrates you eat at each meal. Not using orange juice to treat low blood sugars. Instead, use apple juice, cranberry juice, or clear soda. You may be given guidelines on what foods and nutrients you may eat, and how much you can have each day. This depends on your stage of kidney disease and whether you have high blood pressure (hypertension). Follow the meal plan your dietitian gives you. To learn more: [employer] of Diabetes and Digestive and Kidney Diseases: [employer]si [employer]: kidney.org Summary Chronic kidney disease (CKD) is when your kidneys are not working well. They cannot remove waste, fluids, and other substances from your blood the way they should. These substances can build up, which can worsen kidney damage and affect how your body works. Changing your diet can help prevent more kidney damage. Diet changes may also delay dialysis or even keep you from needing it. Diet changes are different for each person with CKD.  Work with a dietitian to set up a meal plan that is right for you. This information is not intended to replace advice given to you by your health care provider. Make sure you discuss any questions you have with your health care provider. Document Revised: 09/06/2021 Document Reviewed: 09/13/2019 Elsevier Patient Education  2024 [employer].

## 2023-02-12 ENCOUNTER — Ambulatory Visit (INDEPENDENT_AMBULATORY_CARE_PROVIDER_SITE_OTHER): Payer: PPO | Admitting: Nurse Practitioner

## 2023-02-12 ENCOUNTER — Encounter: Payer: Self-pay | Admitting: Nurse Practitioner

## 2023-02-12 VITALS — BP 136/78 | HR 73 | Temp 97.9°F | Ht 64.0 in | Wt 185.6 lb

## 2023-02-12 DIAGNOSIS — E782 Mixed hyperlipidemia: Secondary | ICD-10-CM | POA: Diagnosis not present

## 2023-02-12 DIAGNOSIS — I10 Essential (primary) hypertension: Secondary | ICD-10-CM | POA: Diagnosis not present

## 2023-02-12 DIAGNOSIS — G40802 Other epilepsy, not intractable, without status epilepticus: Secondary | ICD-10-CM | POA: Diagnosis not present

## 2023-02-12 DIAGNOSIS — E538 Deficiency of other specified B group vitamins: Secondary | ICD-10-CM

## 2023-02-12 DIAGNOSIS — M8588 Other specified disorders of bone density and structure, other site: Secondary | ICD-10-CM | POA: Diagnosis not present

## 2023-02-12 DIAGNOSIS — E66811 Obesity, class 1: Secondary | ICD-10-CM

## 2023-02-12 DIAGNOSIS — F320 Major depressive disorder, single episode, mild: Secondary | ICD-10-CM | POA: Diagnosis not present

## 2023-02-12 DIAGNOSIS — E6609 Other obesity due to excess calories: Secondary | ICD-10-CM

## 2023-02-12 DIAGNOSIS — F419 Anxiety disorder, unspecified: Secondary | ICD-10-CM

## 2023-02-12 DIAGNOSIS — Z0000 Encounter for general adult medical examination without abnormal findings: Secondary | ICD-10-CM

## 2023-02-12 DIAGNOSIS — E039 Hypothyroidism, unspecified: Secondary | ICD-10-CM | POA: Diagnosis not present

## 2023-02-12 DIAGNOSIS — D508 Other iron deficiency anemias: Secondary | ICD-10-CM | POA: Diagnosis not present

## 2023-02-12 DIAGNOSIS — N1831 Chronic kidney disease, stage 3a: Secondary | ICD-10-CM | POA: Diagnosis not present

## 2023-02-12 DIAGNOSIS — K2100 Gastro-esophageal reflux disease with esophagitis, without bleeding: Secondary | ICD-10-CM | POA: Diagnosis not present

## 2023-02-12 MED ORDER — PANTOPRAZOLE SODIUM 40 MG PO TBEC: 40.0000 mg | DELAYED_RELEASE_TABLET | Freq: Every day | ORAL | 4 refills | Status: DC

## 2023-02-12 MED ORDER — ESCITALOPRAM OXALATE 10 MG PO TABS: 10.0000 mg | ORAL_TABLET | Freq: Every day | ORAL | 4 refills | Status: DC

## 2023-02-12 MED ORDER — LOSARTAN POTASSIUM 100 MG PO TABS: 100.0000 mg | ORAL_TABLET | Freq: Every day | ORAL | 4 refills | Status: DC

## 2023-02-12 MED ORDER — HYDROCHLOROTHIAZIDE 25 MG PO TABS: 12.5000 mg | ORAL_TABLET | Freq: Every day | ORAL | 4 refills | Status: DC

## 2023-02-12 MED ORDER — ATORVASTATIN CALCIUM 40 MG PO TABS: 40.0000 mg | ORAL_TABLET | Freq: Every day | ORAL | 4 refills | Status: DC

## 2023-02-12 MED ORDER — MIRTAZAPINE 30 MG PO TABS: 30.0000 mg | ORAL_TABLET | ORAL | 4 refills | Status: DC

## 2023-02-12 NOTE — Assessment & Plan Note (Signed)
Chronic, ongoing.  Continue current medication regimen, Remeron, and adjust as needed.   

## 2023-02-12 NOTE — Assessment & Plan Note (Signed)
Chronic, ongoing.  Continue daily supplement and adjust as needed.  Check B12 level today. 

## 2023-02-12 NOTE — Assessment & Plan Note (Signed)
Chronic, ongoing.  Continue current medication regimen and adjust as needed.  Lipid panel today.  Return in 6 months. 

## 2023-02-12 NOTE — Assessment & Plan Note (Addendum)
Chronic, stable.  Continue current medication regimen as recommended by neuro and collaboration with them.  CMP today.  Recent neuro note reviewed.

## 2023-02-12 NOTE — Assessment & Plan Note (Signed)
Chronic, ongoing.  Continue current medication regimen at this time and adjust as needed.  Discussed the risks and benefits of long term PPI use including but not limited to bone loss, chronic kidney disease, infections, low magnesium.  Will aim to use at the lowest dose for the shortest period of time.  Mag level today.  She has tried reductions without benefit.

## 2023-02-12 NOTE — Assessment & Plan Note (Signed)
BMI 31.86.  Recommended eating smaller high protein, low fat meals more frequently and exercising 30 mins a day 5 times a week with a goal of 10-15lb weight loss in the next 3 months. Patient voiced their understanding and motivation to adhere to these recommendations.

## 2023-02-12 NOTE — Assessment & Plan Note (Signed)
Chronic, stable. Initial BP elevated today, but repeat at goal for age and home BP levels at goal.  Recommend she monitor BP at least a few mornings a week at home and document.  DASH diet at home.  Continue current medication regimen and adjust as needed.  Labs: CBC, CMP.  Refills sent in.  Return in 6 months.

## 2023-02-12 NOTE — Assessment & Plan Note (Signed)
Chronic, ongoing.  Continue current medication regimen, Remeron and Lexapro, and adjust as needed -- currently followed by neurology for this.  Denies SI/HI.  Return in 6 months for follow-up. 

## 2023-02-12 NOTE — Progress Notes (Signed)
BP 136/78 (BP Location: Left Arm, Patient Position: Sitting, Cuff Size: Normal)   Pulse 73   Temp 97.9 F (36.6 C) (Oral)   Ht 5\' 4"  (1.626 m)   Wt 185 lb 9.6 oz (84.2 kg)   SpO2 95%   BMI 31.86 kg/m    Subjective:    Patient ID: Rachel Cox, female    DOB: 1951/03/17, 72 y.o.   MRN: [id number]  HPI: Rachel Cox is a 72 y.o. female presenting on 02/12/2023 for comprehensive medical examination. Current medical complaints include:none  She currently lives with: husband Menopausal Symptoms: no   HYPERTENSION / HYPERLIPIDEMIA Continues on Losartan, HCTZ and Atorvastatin.   Satisfied with current treatment? yes Duration of hypertension: chronic BP monitoring frequency: once a week BP range: 130/70 to 80 range  BP medication side effects: yes Duration of hyperlipidemia: chronic Cholesterol medication side effects: no Cholesterol supplements: none Medication compliance: good compliance Aspirin: no Recent stressors: no Recurrent headaches: no Visual changes: no Palpitations: no Dyspnea: no Chest pain: no Lower extremity edema: no Dizzy/lightheaded: no  The 10-year ASCVD risk score (Arnett DK, et al., 2019) is: 20.8%   Values used to calculate the score:     Age: 15 years     Sex: Female     Is Non-Hispanic African American: No     Diabetic: No     Tobacco smoker: No     Systolic Blood Pressure: 136 mmHg     Is BP treated: Yes     HDL Cholesterol: 75 mg/dL     Total Cholesterol: 181 mg/dL   SEIZURE DISORDER Follows with neurology, last visit 05/07/22.  No seizure since 2016. Keppra 250 MG at bedtime, which was refilled by them.  They have her on Remeron for sleep and continues Lexapro for mood.  This overall helps with her mood and sleep pattern.  GERD Continues on Protonix daily.  Has tried to reduce and worsening heart burn presented. GERD control status: stable Satisfied with current treatment? yes Heartburn frequency: none Medication side effects: no   Medication compliance: stable Dysphagia: no Odynophagia:  no Hematemesis: no Blood in stool: no EGD: yes    ANEMIA Continues on supplements -- folic acid.   Anemia status: stable Etiology of anemia: Duration of anemia treatment:  Compliance with treatment: good compliance Iron supplementation side effects: no Severity of anemia: mild Fatigue: no Decreased exercise tolerance: no  Dyspnea on exertion: no Palpitations: no Bleeding: no Pica: no    CHRONIC KIDNEY DISEASE (CKD 3a) Ongoing CKD 3a noted on labs, fluctuates.  Drinking plenty of water at home. CKD status: stable Medications renally dose: yes Previous renal evaluation: no Pneumovax:  Up to Date Influenza Vaccine:  Up to Date   OSTEOPENIA Has underlying osteopenia noted on DEXA September 2020, continues on daily supplements.  No recent falls or fractures. Satisfied with current treatment?: yes Adequate calcium & vitamin D: yes Weight bearing exercises: yes    HYPOTHYROIDISM Last levels stable, continues on Levothyroxine 75 MCG daily. Saw endo in past, with last visit 01/02/22. Thyroid control status:stable Satisfied with current treatment? yes Medication side effects: no Medication compliance: good compliance Etiology of hypothyroidism:  Recent dose adjustment:no Fatigue: no Cold intolerance: no Heat intolerance: no Weight gain: no Weight loss: no Constipation: no Diarrhea/loose stools: no Palpitations: no Lower extremity edema: no Anxiety/depressed mood: no    ANXIETY/STRESS Duration:stable Anxious mood: no  Excessive worrying: no Irritability: no  Sweating: no Nausea: no Palpitations:no Hyperventilation: no Panic  attacks: no Agoraphobia: no  Obscessions/compulsions: no Depressed mood: no    02/12/2023    8:31 AM 08/08/2022    9:44 AM 07/22/2022    3:04 PM 02/07/2022    8:05 AM 08/06/2021    8:23 AM  Depression screen PHQ 2/9  Decreased Interest 1 1 0 0 0  Down, Depressed, Hopeless 0 0 0 0 0   PHQ - 2 Score 1 1 0 0 0  Altered sleeping 0 1 0 0 0  Tired, decreased energy 0 0 0 0 0  Change in appetite 0 0 0 0 0  Feeling bad or failure about yourself  0 0 0 0 0  Trouble concentrating 0 0 0 0 0  Moving slowly or fidgety/restless 0 0 0 0 0  Suicidal thoughts 0 0 0 0 0  PHQ-9 Score 1 2 0 0 0  Difficult doing work/chores Not difficult at all Not difficult at all Not difficult at all Not difficult at all Not difficult at all      02/12/2023    8:31 AM 08/08/2022    9:44 AM 02/07/2022    8:05 AM 08/06/2021    8:23 AM  GAD 7 : Generalized Anxiety Score  Nervous, Anxious, on Edge 0 0 0 0  Control/stop worrying 0 0 0 0  Worry too much - different things 0 0 0 0  Trouble relaxing 0 0 0 0  Restless 0 0 0 0  Easily annoyed or irritable 0 0 0 0  Afraid - awful might happen 0 0 0 0  Total GAD 7 Score 0 0 0 0  Anxiety Difficulty Not difficult at all Not difficult at all Not difficult at all Not difficult at all     Functional Status Survey: Is the patient deaf or have difficulty hearing?: No Does the patient have difficulty seeing, even when wearing glasses/contacts?: No Does the patient have difficulty concentrating, remembering, or making decisions?: No Does the patient have difficulty walking or climbing stairs?: No Does the patient have difficulty dressing or bathing?: No Does the patient have difficulty doing errands alone such as visiting a doctor's office or shopping?: No      02/12/2023    8:30 AM 08/08/2022    9:44 AM 07/22/2022    3:06 PM 02/07/2022    8:05 AM 07/09/2021    8:21 AM  Fall Risk   Falls in the past year? 0 0 0 0 0  Number falls in past yr: 0 0 0 0 0  Injury with Fall? 0 0 0 0 0  Risk for fall due to : No Fall Risks No Fall Risks No Fall Risks No Fall Risks   Follow up Falls evaluation completed Falls evaluation completed Falls prevention discussed;Falls evaluation completed Falls evaluation completed Falls evaluation completed;Falls prevention discussed    Past  Medical History:  Past Medical History:  Diagnosis Date   Allergic rhinitis    Allergy    Anemia    Anxiety    Depression    Epilepsy (HCC)    managed by Dr. Eliane Cox 2015/2016   GERD (gastroesophageal reflux disease)    History of abnormal cervical Pap smear 1980's   Hyperglycemia    Hyperlipidemia    Hypertension    Hypothyroidism    Insomnia    Obesity (BMI 30.0-34.9) 01/06/2017   Osteopenia    Reflux esophagitis 10/31/2017   EGD    Surgical History:  Past Surgical History:  Procedure Laterality Date   COLONOSCOPY  Sept 2015   COLONOSCOPY WITH PROPOFOL N/A 09/10/2017   Procedure: COLONOSCOPY WITH PROPOFOL;  Surgeon: Earline Mayotte, MD;  [facility] ENDOSCOPY;  Service: Endoscopy;  Laterality: N/A;   COLONOSCOPY WITH PROPOFOL N/A 09/18/2022   Procedure: COLONOSCOPY WITH PROPOFOL;  Surgeon: Sung Amabile, DO;  Location: [facility] ENDOSCOPY;  Service: General;  Laterality: N/A;   Cryo Procedure  1980s   for abnormal pap   ESOPHAGOGASTRODUODENOSCOPY (EGD) WITH PROPOFOL N/A 09/10/2017   Procedure: ESOPHAGOGASTRODUODENOSCOPY (EGD) WITH PROPOFOL;  Surgeon: Earline Mayotte, MD;  Location: [facility] ENDOSCOPY;  Service: Endoscopy;  Laterality: N/A;   ESOPHAGOGASTRODUODENOSCOPY (EGD) WITH PROPOFOL N/A 10/29/2017   Procedure: ESOPHAGOGASTRODUODENOSCOPY (EGD) WITH PROPOFOL;  Surgeon: Earline Mayotte, MD;  Location: [facility] ENDOSCOPY;  Service: Endoscopy;  Laterality: N/A;   ESOPHAGOGASTRODUODENOSCOPY (EGD) WITH PROPOFOL N/A 09/18/2022   Procedure: ESOPHAGOGASTRODUODENOSCOPY (EGD) WITH PROPOFOL;  Surgeon: Sung Amabile, DO;  Location: [facility] ENDOSCOPY;  Service: General;  Laterality: N/A;   EYE SURGERY Bilateral 2021   cataract removal   LIPOMA EXCISION N/A 12/12/2017   Procedure: EXCISION BACK LIPOMA;  Surgeon: Earline Mayotte, MD;  Location: [facility] ORS;  Service: General;  Laterality: N/A;   TUBAL LIGATION  1969    Medications:  Current Outpatient Medications on File Prior to  Visit  Medication Sig   Cholecalciferol 25 MCG (1000 UT) tablet Take 1 tablet (1,000 Units total) by mouth daily.   dorzolamide (TRUSOPT) 2 % ophthalmic solution 1 drop 2 (two) times daily.   folic acid (FOLVITE) 400 MCG tablet Take 400 mcg by mouth daily.   levETIRAcetam (KEPPRA) 250 MG tablet Take 250 mg by mouth at bedtime.    levothyroxine (SYNTHROID) 75 MCG tablet Take 1 tablet (75 mcg total) by mouth daily. Take one tablet every morning 30 minutes before eating and taking other medications.   vitamin B-12 (CYANOCOBALAMIN) 500 MCG tablet Take 500 mcg by mouth daily.   No current facility-administered medications on file prior to visit.    Allergies:  Allergies  Allergen Reactions   Aspirin Nausea Only   Codeine Palpitations    Pt vocalized   Flonase [Fluticasone Propionate] Other (See Comments)    Make glaucoma worse    Social History:  Social History   Socioeconomic History   Marital status: Married    Spouse name: Justin Mend   Number of children: 4   Years of education: Not on file   Highest education level: 10th grade  Occupational History   Occupation: Retired  Tobacco Use   Smoking status: Never   Smokeless tobacco: Never  Vaping Use   Vaping status: Never Used  Substance and Sexual Activity   Alcohol use: Yes    Comment: wine occ   Drug use: No   Sexual activity: Not Currently    Birth control/protection: Post-menopausal  Other Topics Concern   Not on file  Social History Narrative   Not on file   Social Determinants of Health   Financial Resource Strain: Low Risk  (07/22/2022)   Overall Financial Resource Strain (CARDIA)    Difficulty of Paying Living Expenses: Not hard at all  Food Insecurity: No Food Insecurity (07/22/2022)   Hunger Vital Sign    Worried About Running Out of Food in the Last Year: Never true    Ran Out of Food in the Last Year: Never true  Transportation Needs: No Transportation Needs (07/22/2022)   PRAPARE - Doctor, general practice (Medical): No    Lack of Transportation (Non-Medical):  No  Physical Activity: Insufficiently Active (07/22/2022)   Exercise Vital Sign    Days of Exercise per Week: 2 days    Minutes of Exercise per Session: 20 min  Stress: No Stress Concern Present (07/22/2022)   [employer] of Occupational Health - Occupational Stress Questionnaire    Feeling of Stress : Not at all  Social Connections: Moderately Integrated (07/22/2022)   Social Connection and Isolation Panel [NHANES]    Frequency of Communication with Friends and Family: More than three times a week    Frequency of Social Gatherings with Friends and Family: Twice a week    Attends Religious Services: More than 4 times per year    Active Member of [employer] or Organizations: No    Attends Banker Meetings: Never    Marital Status: Married  Catering manager Violence: Not At Risk (07/22/2022)   Humiliation, Afraid, Rape, and Kick questionnaire    Fear of Current or Ex-Partner: No    Emotionally Abused: No    Physically Abused: No    Sexually Abused: No   Social History   Tobacco Use  Smoking Status Never  Smokeless Tobacco Never   Social History   Substance and Sexual Activity  Alcohol Use Yes   Comment: wine occ    Family History:  Family History  Problem Relation Age of Onset   Hyperlipidemia Mother    Hypertension Mother    CAD Mother    Thyroid disease Mother    Heart disease Mother    COPD Father    Thyroid disease Father    Cancer Brother        liver   Alcohol abuse Brother    Hypertension Brother    Liver disease Brother    Thyroid disease Sister    Diabetes Neg Hx    Stroke Neg Hx     Past medical history, surgical history, medications, allergies, family history and social history reviewed with patient today and changes made to appropriate areas of the chart.   Review of Systems - negative All other ROS negative except what is listed above and in the HPI.       Objective:    BP 136/78 (BP Location: Left Arm, Patient Position: Sitting, Cuff Size: Normal)   Pulse 73   Temp 97.9 F (36.6 C) (Oral)   Ht 5\' 4"  (1.626 m)   Wt 185 lb 9.6 oz (84.2 kg)   SpO2 95%   BMI 31.86 kg/m   Wt Readings from Last 3 Encounters:  02/12/23 185 lb 9.6 oz (84.2 kg)  09/18/22 183 lb 9.6 oz (83.3 kg)  08/08/22 186 lb 9.6 oz (84.6 kg)    Physical Exam Vitals and nursing note reviewed. Exam conducted with a chaperone present.  Constitutional:      General: She is awake. She is not in acute distress.    Appearance: She is well-developed and well-groomed. She is obese. She is not ill-appearing or toxic-appearing.  HENT:     Head: Normocephalic and atraumatic.     Right Ear: Hearing, tympanic membrane, ear canal and external ear normal. No drainage.     Left Ear: Hearing, tympanic membrane, ear canal and external ear normal. No drainage.     Nose: Nose normal.     Right Sinus: No maxillary sinus tenderness or frontal sinus tenderness.     Left Sinus: No maxillary sinus tenderness or frontal sinus tenderness.     Mouth/Throat:     Mouth: Mucous membranes are  moist.     Pharynx: Oropharynx is clear. Uvula midline. No pharyngeal swelling, oropharyngeal exudate or posterior oropharyngeal erythema.  Eyes:     General: Lids are normal.        Right eye: No discharge.        Left eye: No discharge.     Extraocular Movements: Extraocular movements intact.     Conjunctiva/sclera: Conjunctivae normal.     Pupils: Pupils are equal, round, and reactive to light.     Visual Fields: Right eye visual fields normal and left eye visual fields normal.  Neck:     Thyroid: No thyromegaly.     Vascular: No carotid bruit.     Trachea: Trachea normal.  Cardiovascular:     Rate and Rhythm: Normal rate and regular rhythm.     Heart sounds: Normal heart sounds. No murmur heard.    No gallop.  Pulmonary:     Effort: Pulmonary effort is normal. No accessory muscle usage or  respiratory distress.     Breath sounds: Normal breath sounds.  Chest:  Breasts:    Right: Normal.     Left: Normal.  Abdominal:     General: Bowel sounds are normal.     Palpations: Abdomen is soft. There is no hepatomegaly or splenomegaly.     Tenderness: There is no abdominal tenderness.  Musculoskeletal:        General: Normal range of motion.     Cervical back: Normal range of motion and neck supple.     Right lower leg: No edema.     Left lower leg: No edema.  Lymphadenopathy:     Head:     Right side of head: No submental, submandibular, tonsillar, preauricular or posterior auricular adenopathy.     Left side of head: No submental, submandibular, tonsillar, preauricular or posterior auricular adenopathy.     Cervical: No cervical adenopathy.     Upper Body:     Right upper body: No supraclavicular, axillary or pectoral adenopathy.     Left upper body: No supraclavicular, axillary or pectoral adenopathy.  Skin:    General: Skin is warm and dry.     Capillary Refill: Capillary refill takes less than 2 seconds.     Findings: No rash.  Neurological:     Mental Status: She is alert and oriented to person, place, and time.     Gait: Gait is intact.     Deep Tendon Reflexes: Reflexes are normal and symmetric.     Reflex Scores:      Brachioradialis reflexes are 2+ on the right side and 2+ on the left side.      Patellar reflexes are 2+ on the right side and 2+ on the left side. Psychiatric:        Attention and Perception: Attention normal.        Mood and Affect: Mood normal.        Speech: Speech normal.        Behavior: Behavior normal. Behavior is cooperative.        Thought Content: Thought content normal.        Judgment: Judgment normal.    Results for orders placed or performed in visit on 10/03/22  HM MAMMOGRAPHY  Result Value Ref Range   HM Mammogram 0-4 Bi-Rad 0-4 Bi-Rad, Self Reported Normal      Assessment & Plan:   Problem List Items Addressed This  Visit       Cardiovascular and Mediastinum  Essential hypertension    Chronic, stable. Initial BP elevated today, but repeat at goal for age and home BP levels at goal.  Recommend she monitor BP at least a few mornings a week at home and document.  DASH diet at home.  Continue current medication regimen and adjust as needed.  Labs: CBC, CMP.  Refills sent in.  Return in 6 months.       Relevant Medications   atorvastatin (LIPITOR) 40 MG tablet   hydrochlorothiazide (HYDRODIURIL) 25 MG tablet   losartan (COZAAR) 100 MG tablet   Other Relevant Orders   CBC with Differential/Platelet   Comprehensive metabolic panel     Digestive   Reflux esophagitis    Chronic, ongoing.  Continue current medication regimen at this time and adjust as needed.  Discussed the risks and benefits of long term PPI use including but not limited to bone loss, chronic kidney disease, infections, low magnesium.  Will aim to use at the lowest dose for the shortest period of time.  Mag level today.  She has tried reductions without benefit.      Relevant Orders   Magnesium     Endocrine   Hypothyroidism    Chronic, ongoing.  Continue current medication regimen as recommended by endocrinology.  Took over monitoring levels in office due decrease travel time for patient.  Thyroid labs today.      Relevant Orders   TSH   T4, free     Nervous and Auditory   Epilepsy (HCC)    Chronic, stable.  Continue current medication regimen as recommended by neuro and collaboration with them.  CMP today.  Recent neuro note reviewed.        Musculoskeletal and Integument   Osteopenia (Chronic)    Ongoing with DEXA last in 2020 -- repeat in September 2025.  Continue Vit D daily and recommend fall precautions.  Check Vit D level today.      Relevant Orders   VITAMIN D 25 Hydroxy (Vit-D Deficiency, Fractures)     Genitourinary   CKD (chronic kidney disease) stage 3, GFR 30-59 ml/min (HCC) - Primary (Chronic)    Chronic,  stable CKD 3a.  Avoid NSAIDs.  Continue Losartan for kidney protection.  Labs: CMP today on labs.  Consider nephrology referral if any decline.        Other   B12 deficiency    Chronic, ongoing.  Continue daily supplement and adjust as needed.  Check B12 level today.      Relevant Orders   Vitamin B12   Depression, major, single episode, mild (HCC)    Chronic, ongoing.  Continue current medication regimen, Remeron and Lexapro, and adjust as needed -- currently followed by neurology for this.  Denies SI/HI.  Return in 6 months for follow-up.      Relevant Medications   escitalopram (LEXAPRO) 10 MG tablet   mirtazapine (REMERON) 30 MG tablet   Hyperlipemia    Chronic, ongoing.  Continue current medication regimen and adjust as needed.  Lipid panel today. Return in 6 months.      Relevant Medications   atorvastatin (LIPITOR) 40 MG tablet   hydrochlorothiazide (HYDRODIURIL) 25 MG tablet   losartan (COZAAR) 100 MG tablet   Other Relevant Orders   Comprehensive metabolic panel   Lipid Panel w/o Chol/HDL Ratio   Insomnia secondary to anxiety    Chronic, ongoing.  Continue current medication regimen, Remeron, and adjust as needed.        Relevant Medications  escitalopram (LEXAPRO) 10 MG tablet   mirtazapine (REMERON) 30 MG tablet   Iron deficiency anemia    Chronic, stable.  Check CBC, ferritin, iron, and B12 today.  Continue daily supplement and adjust plan of care as needed based on labs.      Relevant Orders   Ferritin   Iron and TIBC   Obesity    BMI 31.86.  Recommended eating smaller high protein, low fat meals more frequently and exercising 30 mins a day 5 times a week with a goal of 10-15lb weight loss in the next 3 months. Patient voiced their understanding and motivation to adhere to these recommendations.       Other Visit Diagnoses     Encounter for annual physical exam       Annual physical today with labs and health maintenance reviewed, discussed with  patient.        Follow up plan: Return in about 6 months (around 08/12/2023) for HTN/HLD, SEIZURE, THYROID, OSTEOPENIA (make sure to order DEXA for May with mammo).   LABORATORY TESTING:  - Pap smear: not applicable  IMMUNIZATIONS:   - Tdap: Tetanus vaccination status reviewed: refused. - Influenza: Refuses today --  will get at [facility] [employer] - Pneumovax: Up to date - Prevnar: Up to date - HPV: Not applicable - Zostavax vaccine: Refuses to get second one, first made her feel really bad - Covid: Up To Date  SCREENING: -Mammogram: Up to date -- [facility] 10/02/22 - Colonoscopy: Up to date  - Bone Density: Up to date -- [facility] 02/16/2019 -Hearing Test: Not applicable  -Spirometry: Not applicable   PATIENT COUNSELING:   Advised to take 1 mg of folate supplement per day if capable of pregnancy.   Sexuality: Discussed sexually transmitted diseases, partner selection, use of condoms, avoidance of unintended pregnancy  and contraceptive alternatives.   Advised to avoid cigarette smoking.  I discussed with the patient that most people either abstain from alcohol or drink within safe limits (<=14/week and <=4 drinks/occasion for males, <=7/weeks and <= 3 drinks/occasion for females) and that the risk for alcohol disorders and other health effects rises proportionally with the number of drinks per week and how often a drinker exceeds daily limits.  Discussed cessation/primary prevention of drug use and availability of treatment for abuse.   Diet: Encouraged to adjust caloric intake to maintain  or achieve ideal body weight, to reduce intake of dietary saturated fat and total fat, to limit sodium intake by avoiding high sodium foods and not adding table salt, and to maintain adequate dietary potassium and calcium preferably from fresh fruits, vegetables, and low-fat dairy products.    Stressed the importance of regular exercise  Injury prevention: Discussed safety belts, safety helmets, smoke  detector, smoking near bedding or upholstery.   Dental health: Discussed importance of regular tooth brushing, flossing, and dental visits.    NEXT PREVENTATIVE PHYSICAL DUE IN 1 YEAR. Return in about 6 months (around 08/12/2023) for HTN/HLD, SEIZURE, THYROID, OSTEOPENIA (make sure to order DEXA for May with mammo).

## 2023-02-12 NOTE — Assessment & Plan Note (Addendum)
Chronic, stable CKD 3a.  Avoid NSAIDs.  Continue Losartan for kidney protection.  Labs: CMP today on labs.  Consider nephrology referral if any decline.

## 2023-02-12 NOTE — Assessment & Plan Note (Signed)
Ongoing with DEXA last in 2020 -- repeat in September 2025.  Continue Vit D daily and recommend fall precautions.  Check Vit D level today.

## 2023-02-12 NOTE — Assessment & Plan Note (Signed)
Chronic, ongoing.  Continue current medication regimen as recommended by endocrinology.  Took over monitoring levels in office due decrease travel time for patient.  Thyroid labs today.

## 2023-02-12 NOTE — Assessment & Plan Note (Signed)
Chronic, stable.  Check CBC, ferritin, iron, and B12 today.  Continue daily supplement and adjust plan of care as needed based on labs. 

## 2023-02-13 LAB — COMPREHENSIVE METABOLIC PANEL
ALT: 11 IU/L (ref 0–32)
AST: 14 IU/L (ref 0–40)
Albumin: 4.4 g/dL (ref 3.8–4.8)
Alkaline Phosphatase: 82 IU/L (ref 44–121)
BUN/Creatinine Ratio: 14 (ref 12–28)
BUN: 17 mg/dL (ref 8–27)
Bilirubin Total: 0.5 mg/dL (ref 0.0–1.2)
CO2: 23 mmol/L (ref 20–29)
Calcium: 10 mg/dL (ref 8.7–10.3)
Chloride: 105 mmol/L (ref 96–106)
Creatinine, Ser: 1.22 mg/dL — ABNORMAL HIGH (ref 0.57–1.00)
Globulin, Total: 2.4 g/dL (ref 1.5–4.5)
Glucose: 98 mg/dL (ref 70–99)
Potassium: 3.6 mmol/L (ref 3.5–5.2)
Sodium: 143 mmol/L (ref 134–144)
Total Protein: 6.8 g/dL (ref 6.0–8.5)
eGFR: 47 mL/min/{1.73_m2} — ABNORMAL LOW (ref >59)

## 2023-02-13 LAB — CBC WITH DIFFERENTIAL/PLATELET
Basophils Absolute: 0.1 10*3/uL (ref 0.0–0.2)
Basos: 1 %
EOS (ABSOLUTE): 0.1 10*3/uL (ref 0.0–0.4)
Eos: 3 %
Hematocrit: 41.9 % (ref 34.0–46.6)
Hemoglobin: 13.1 g/dL (ref 11.1–15.9)
Immature Grans (Abs): 0 10*3/uL (ref 0.0–0.1)
Immature Granulocytes: 0 %
Lymphocytes Absolute: 1.9 10*3/uL (ref 0.7–3.1)
Lymphs: 36 %
MCH: 27.4 pg (ref 26.6–33.0)
MCHC: 31.3 g/dL — ABNORMAL LOW (ref 31.5–35.7)
MCV: 88 fL (ref 79–97)
Monocytes Absolute: 0.7 10*3/uL (ref 0.1–0.9)
Monocytes: 13 %
Neutrophils Absolute: 2.5 10*3/uL (ref 1.4–7.0)
Neutrophils: 47 %
Platelets: 262 10*3/uL (ref 150–450)
RBC: 4.78 x10E6/uL (ref 3.77–5.28)
RDW: 14.2 % (ref 11.7–15.4)
WBC: 5.3 10*3/uL (ref 3.4–10.8)

## 2023-02-13 LAB — FERRITIN: Ferritin: 9 ng/mL — ABNORMAL LOW (ref 15–150)

## 2023-02-13 LAB — LIPID PANEL W/O CHOL/HDL RATIO
Cholesterol, Total: 169 mg/dL (ref 100–199)
HDL: 72 mg/dL (ref >39)
LDL Chol Calc (NIH): 79 mg/dL (ref 0–99)
Triglycerides: 102 mg/dL (ref 0–149)
VLDL Cholesterol Cal: 18 mg/dL (ref 5–40)

## 2023-02-13 LAB — VITAMIN B12: Vitamin B-12: 729 pg/mL (ref 232–1245)

## 2023-02-13 LAB — VITAMIN D 25 HYDROXY (VIT D DEFICIENCY, FRACTURES): Vit D, 25-Hydroxy: 42.5 ng/mL (ref 30.0–100.0)

## 2023-02-13 LAB — IRON AND TIBC
Iron Saturation: 13 % — ABNORMAL LOW (ref 15–55)
Iron: 48 ug/dL (ref 27–139)
Total Iron Binding Capacity: 365 ug/dL (ref 250–450)
UIBC: 317 ug/dL (ref 118–369)

## 2023-02-13 LAB — T4, FREE: Free T4: 1.55 ng/dL (ref 0.82–1.77)

## 2023-02-13 LAB — TSH: TSH: 1.58 u[IU]/mL (ref 0.450–4.500)

## 2023-02-13 LAB — MAGNESIUM: Magnesium: 2 mg/dL (ref 1.6–2.3)

## 2023-02-13 NOTE — Progress Notes (Signed)
Contacted via MyChart   Good morning Rachel Cox, your labs have returned and everything remains baseline: - Kidney function, creatinine and eGFR, continues to show chronic kidney disease stage 3a with no significant change.  Liver function is normal.  Iron level is on low side of normal and ferritin low, please ensure you are taking your supplements.  Thyroid labs at goal, continue current Levothyroxine dosing.  Any questions? Keep being excellent!!  Thank you for allowing me to participate in your care.  I appreciate you. Kindest regards, Khari Mally

## 2023-05-06 DIAGNOSIS — R569 Unspecified convulsions: Secondary | ICD-10-CM | POA: Diagnosis not present

## 2023-05-06 DIAGNOSIS — I10 Essential (primary) hypertension: Secondary | ICD-10-CM | POA: Diagnosis not present

## 2023-05-06 DIAGNOSIS — F419 Anxiety disorder, unspecified: Secondary | ICD-10-CM | POA: Diagnosis not present

## 2023-05-19 DIAGNOSIS — H40153 Residual stage of open-angle glaucoma, bilateral: Secondary | ICD-10-CM | POA: Diagnosis not present

## 2023-07-31 ENCOUNTER — Ambulatory Visit: Payer: PPO

## 2023-07-31 VITALS — Ht 64.0 in | Wt 185.0 lb

## 2023-07-31 DIAGNOSIS — Z0000 Encounter for general adult medical examination without abnormal findings: Secondary | ICD-10-CM

## 2023-07-31 DIAGNOSIS — Z1231 Encounter for screening mammogram for malignant neoplasm of breast: Secondary | ICD-10-CM

## 2023-07-31 DIAGNOSIS — Z780 Asymptomatic menopausal state: Secondary | ICD-10-CM | POA: Diagnosis not present

## 2023-07-31 NOTE — Progress Notes (Signed)
 Subjective:   Rachel Cox is a 73 y.o. who presents for a Medicare Wellness preventive visit.  Visit Complete: Virtual I connected with  Dirk Dress on 07/31/23 by a audio enabled telemedicine application and verified that I am speaking with the correct person using two identifiers.  Patient Location: Home  Provider Location: Home Office  I discussed the limitations of evaluation and management by telemedicine. The patient expressed understanding and agreed to proceed.  Vital Signs: Because this visit was a virtual/telehealth visit, some criteria may be missing or patient reported. Any vitals not documented were not able to be obtained and vitals that have been documented are patient reported.  VideoDeclined- This patient declined Librarian, academic. Therefore the visit was completed with audio only.  AWV Questionnaire: No: Patient Medicare AWV questionnaire was not completed prior to this visit.  Cardiac Risk Factors include: advanced age (>41men, >65 women);dyslipidemia;hypertension;obesity (BMI >30kg/m2)     Objective:    Today's Vitals   07/31/23 1509  Weight: 185 lb (83.9 kg)  Height: 5\' 4"  (1.626 m)   Body mass index is 31.76 kg/m.     07/31/2023    3:19 PM 09/18/2022    7:05 AM 07/22/2022    3:05 PM 07/09/2021    8:29 AM 07/07/2020    8:18 AM 02/01/2019    8:15 AM 12/12/2017    7:48 AM  Advanced Directives  Does Patient Have a Medical Advance Directive? No No No No No No No  Would patient like information on creating a medical advance directive? No - Patient declined  No - Patient declined No - Patient declined  Yes (MAU/Ambulatory/Procedural Areas - Information given) No - Patient declined    Current Medications (verified) Outpatient Encounter Medications as of 07/31/2023  Medication Sig   atorvastatin (LIPITOR) 40 MG tablet Take 1 tablet (40 mg total) by mouth at bedtime.   Cholecalciferol 25 MCG (1000 UT) tablet Take 1 tablet (1,000  Units total) by mouth daily.   dorzolamide (TRUSOPT) 2 % ophthalmic solution 1 drop 2 (two) times daily.   escitalopram (LEXAPRO) 10 MG tablet Take 1 tablet (10 mg total) by mouth daily.   ferrous sulfate 325 (65 FE) MG EC tablet Take 325 mg by mouth daily.   folic acid (FOLVITE) 400 MCG tablet Take 400 mcg by mouth daily.   hydrochlorothiazide (HYDRODIURIL) 25 MG tablet Take 0.5 tablets (12.5 mg total) by mouth daily.   levETIRAcetam (KEPPRA) 250 MG tablet Take 250 mg by mouth at bedtime.    levothyroxine (SYNTHROID) 75 MCG tablet Take 1 tablet (75 mcg total) by mouth daily. Take one tablet every morning 30 minutes before eating and taking other medications.   losartan (COZAAR) 100 MG tablet Take 1 tablet (100 mg total) by mouth daily.   mirtazapine (REMERON) 30 MG tablet Take 1 tablet (30 mg total) by mouth every morning.   pantoprazole (PROTONIX) 40 MG tablet Take 1 tablet (40 mg total) by mouth daily.   vitamin B-12 (CYANOCOBALAMIN) 500 MCG tablet Take 500 mcg by mouth daily.   No facility-administered encounter medications on file as of 07/31/2023.    Allergies (verified) Aspirin, Codeine, and Flonase [fluticasone propionate]   History: Past Medical History:  Diagnosis Date   Allergic rhinitis    Allergy    Anemia    Anxiety    Depression    Epilepsy (HCC)    managed by Dr. Eliane Decree 2015/2016   GERD (gastroesophageal reflux disease)  History of abnormal cervical Pap smear 1980's   Hyperglycemia    Hyperlipidemia    Hypertension    Hypothyroidism    Insomnia    Obesity (BMI 30.0-34.9) 01/06/2017   Osteopenia    Reflux esophagitis 10/31/2017   EGD   Past Surgical History:  Procedure Laterality Date   COLONOSCOPY  Sept 2015   COLONOSCOPY WITH PROPOFOL N/A 09/10/2017   Procedure: COLONOSCOPY WITH PROPOFOL;  Surgeon: Earline Mayotte, MD;  Location: [facility];  Service: Endoscopy;  Laterality: N/A;   COLONOSCOPY WITH PROPOFOL N/A 09/18/2022   Procedure:  COLONOSCOPY WITH PROPOFOL;  Surgeon: Sung Amabile, DO;  Location: [facility];  Service: General;  Laterality: N/A;   Cryo Procedure  1980s   for abnormal pap   ESOPHAGOGASTRODUODENOSCOPY (EGD) WITH PROPOFOL N/A 09/10/2017   Procedure: ESOPHAGOGASTRODUODENOSCOPY (EGD) WITH PROPOFOL;  Surgeon: Earline Mayotte, MD;  Location: [facility];  Service: Endoscopy;  Laterality: N/A;   ESOPHAGOGASTRODUODENOSCOPY (EGD) WITH PROPOFOL N/A 10/29/2017   Procedure: ESOPHAGOGASTRODUODENOSCOPY (EGD) WITH PROPOFOL;  Surgeon: Earline Mayotte, MD;  Location: [facility];  Service: Endoscopy;  Laterality: N/A;   ESOPHAGOGASTRODUODENOSCOPY (EGD) WITH PROPOFOL N/A 09/18/2022   Procedure: ESOPHAGOGASTRODUODENOSCOPY (EGD) WITH PROPOFOL;  Surgeon: Sung Amabile, DO;  Location: [facility];  Service: General;  Laterality: N/A;   EYE SURGERY Bilateral 2021   cataract removal   LIPOMA EXCISION N/A 12/12/2017   Procedure: EXCISION BACK LIPOMA;  Surgeon: Earline Mayotte, MD;  Location: [facility];  Service: General;  Laterality: N/A;   TUBAL LIGATION  1969   Family History  Problem Relation Age of Onset   Hyperlipidemia Mother    Hypertension Mother    CAD Mother    Thyroid disease Mother    Heart disease Mother    COPD Father    Thyroid disease Father    Cancer Brother        liver   Alcohol abuse Brother    Hypertension Brother    Liver disease Brother    Thyroid disease Sister    Diabetes Neg Hx    Stroke Neg Hx    Social History   Socioeconomic History   Marital status: Married    Spouse name: Justin Mend   Number of children: 4   Years of education: Not on file   Highest education level: 10th grade  Occupational History   Occupation: Retired  Tobacco Use   Smoking status: Never   Smokeless tobacco: Never  Vaping Use   Vaping status: Never Used  Substance and Sexual Activity   Alcohol use: Yes    Comment: 1 glass every 6 mths   Drug use: No   Sexual activity: Not Currently     Birth control/protection: Post-menopausal  Other Topics Concern   Not on file  Social History Narrative   Not on file   Social Drivers of Health   Financial Resource Strain: Low Risk  (07/31/2023)   Overall Financial Resource Strain (CARDIA)    Difficulty of Paying Living Expenses: Not hard at all  Food Insecurity: No Food Insecurity (07/31/2023)   Hunger Vital Sign    Worried About Running Out of Food in the Last Year: Never true    Ran Out of Food in the Last Year: Never true  Transportation Needs: No Transportation Needs (07/31/2023)   PRAPARE - Administrator, Civil Service (Medical): No    Lack of Transportation (Non-Medical): No  Physical Activity: Inactive (07/31/2023)   Exercise Vital Sign    Days  of Exercise per Week: 0 days    Minutes of Exercise per Session: 0 min  Stress: No Stress Concern Present (07/31/2023)   [employer] of Occupational Health - Occupational Stress Questionnaire    Feeling of Stress : Not at all  Social Connections: Moderately Integrated (07/31/2023)   Social Connection and Isolation Panel [NHANES]    Frequency of Communication with Friends and Family: More than three times a week    Frequency of Social Gatherings with Friends and Family: Twice a week    Attends Religious Services: More than 4 times per year    Active Member of [employer] or Organizations: No    Attends Engineer, structural: Never    Marital Status: Married    Tobacco Counseling Counseling given: Not Answered    Clinical Intake:  Pre-visit preparation completed: Yes  Pain : No/denies pain     BMI - recorded: 31.76 Nutritional Status: BMI > 30  Obese Nutritional Risks: None Diabetes: No  How often do you need to have someone help you when you read instructions, pamphlets, or other written materials from your doctor or pharmacy?: 1 - Never  Interpreter Needed?: No  Information entered by :: Tora Kindred, CMA   Activities of Daily Living      07/31/2023    3:10 PM 02/12/2023    8:44 AM  In your present state of health, do you have any difficulty performing the following activities:  Hearing? 0 0  Vision? 0 0  Difficulty concentrating or making decisions? 0 0  Walking or climbing stairs? 0 0  Dressing or bathing? 0 0  Doing errands, shopping? 0 0  Preparing Food and eating ? N   Using the Toilet? N   In the past six months, have you accidently leaked urine? N   Do you have problems with loss of bowel control? N   Managing your Medications? N   Managing your Finances? N   Housekeeping or managing your Housekeeping? N     Patient Care Team: Marjie Skiff, NP as PCP - General (Nurse Practitioner) Morene Crocker, MD as Referring Physician (Neurology) Romero Belling, MD (Inactive) as Consulting Physician (Endocrinology) Lemar Livings Merrily Pew, MD as Consulting Physician (General Surgery) Irene Limbo., MD (Ophthalmology)  Indicate any recent Medical Services you may have received from other than [facility] providers in the past year (date may be approximate).     Assessment:   This is a routine wellness examination for Indira.  Hearing/Vision screen Hearing Screening - Comments:: Denies hearing loss Vision Screening - Comments:: Gets eye exams, Dr. Hulen Luster [facility]   Goals Addressed               This Visit's Progress     Weight (lb) < 160 lb (72.6 kg) (pt-stated)   185 lb (83.9 kg)     COMPLETED: Weight (lb) < 200 lb (90.7 kg)   185 lb (83.9 kg)      Depression Screen     07/31/2023    3:16 PM 02/12/2023    8:31 AM 08/08/2022    9:44 AM 07/22/2022    3:04 PM 02/07/2022    8:05 AM 08/06/2021    8:23 AM 08/06/2021    8:22 AM  PHQ 2/9 Scores  PHQ - 2 Score 0 1 1 0 0 0 0  PHQ- 9 Score 1 1 2  0 0 0     Fall Risk     07/31/2023    3:20 PM  02/12/2023    8:30 AM 08/08/2022    9:44 AM 07/22/2022    3:06 PM 02/07/2022    8:05 AM  Fall Risk   Falls in the past year? 0 0 0 0 0  Number falls in past yr: 0 0 0 0 0   Injury with Fall? 0 0 0 0 0  Risk for fall due to : No Fall Risks No Fall Risks No Fall Risks No Fall Risks No Fall Risks  Follow up Falls prevention discussed;Falls evaluation completed Falls evaluation completed Falls evaluation completed Falls prevention discussed;Falls evaluation completed Falls evaluation completed    MEDICARE RISK AT HOME:  Medicare Risk at Home Any stairs in or around the home?: Yes If so, are there any without handrails?: No Home free of loose throw rugs in walkways, pet beds, electrical cords, etc?: Yes Adequate lighting in your home to reduce risk of falls?: Yes Life alert?: No Use of a cane, Mccoll or w/c?: No Grab bars in the bathroom?: Yes Shower chair or bench in shower?: No Elevated toilet seat or a handicapped toilet?: No  TIMED UP AND GO:  Was the test performed?  No  Cognitive Function: 6CIT completed        07/31/2023    3:21 PM 07/22/2022    3:10 PM 07/07/2020    8:20 AM 02/01/2019    8:15 AM 11/14/2017    2:10 PM  6CIT Screen  What Year? 0 points 0 points 0 points 0 points 0 points  What month? 0 points 0 points 0 points 0 points 0 points  What time? 0 points 0 points 0 points 0 points 0 points  Count back from 20 0 points 0 points 0 points 0 points 0 points  Months in reverse 2 points 0 points 2 points 0 points 0 points  Repeat phrase 0 points 0 points 4 points 0 points 2 points  Total Score 2 points 0 points 6 points 0 points 2 points    Immunizations Immunization History  Administered Date(s) Administered   Fluad Quad(high Dose 65+) 02/01/2019, 03/04/2022   Influenza, High Dose Seasonal PF 03/14/2017, 03/10/2018, 03/07/2021   Influenza,inj,Quad PF,6+ Mos 03/02/2015   Influenza-Unspecified 04/16/2016, 02/15/2020   PFIZER(Purple Top)SARS-COV-2 Vaccination 07/14/2019, 08/04/2019, 03/03/2020, 02/19/2021   Pfizer(Comirnaty)Fall Seasonal Vaccine 12 years and older 03/15/2022   Pneumococcal Conjugate-13 11/15/2013   Pneumococcal  Polysaccharide-23 07/04/2015   Td 07/04/1998   Zoster, Live 06/03/2010    Screening Tests Health Maintenance  Topic Date Due   COVID-19 Vaccine (6 - 2024-25 season) 02/02/2023   INFLUENZA VACCINE  09/01/2023 (Originally 01/02/2023)   MAMMOGRAM  10/02/2023   DEXA SCAN  02/16/2024   Medicare Annual Wellness (AWV)  07/30/2024   Colonoscopy  09/18/2027   Pneumonia Vaccine 45+ Years old  Completed   Hepatitis C Screening  Addressed   HPV VACCINES  Aged Out   DTaP/Tdap/Td  Discontinued   Zoster Vaccines- Shingrix  Discontinued    Health Maintenance  Health Maintenance Due  Topic Date Due   COVID-19 Vaccine (6 - 2024-25 season) 02/02/2023   Health Maintenance Items Addressed: Mammogram ordered, DEXA ordered, See Nurse Notes  Additional Screening:  Vision Screening: Recommended annual ophthalmology exams for early detection of glaucoma and other disorders of the eye.  Dental Screening: Recommended annual dental exams for proper oral hygiene  Community Resource Referral / Chronic Care Management: CRR required this visit?  No   CCM required this visit?  No     Plan:  I have personally reviewed and noted the following in the patient's chart:   Medical and social history Use of alcohol, tobacco or illicit drugs  Current medications and supplements including opioid prescriptions. Patient is not currently taking opioid prescriptions. Functional ability and status Nutritional status Physical activity Advanced directives List of other physicians Hospitalizations, surgeries, and ER visits in previous 12 months Vitals Screenings to include cognitive, depression, and falls Referrals and appointments  In addition, I have reviewed and discussed with patient certain preventive protocols, quality metrics, and best practice recommendations. A written personalized care plan for preventive services as well as general preventive health recommendations were provided to patient.      Tora Kindred, CMA   07/31/2023   After Visit Summary: (MyChart) Due to this being a telephonic visit, the after visit summary with patients personalized plan was offered to patient via MyChart   Notes:  6 CIT Score - 2 Placed order for MMG (due 10/02/23) and DEXA Scan. These will need to be sent to [facility]. Last covid vaccine was fall of 2024 per patient Declined Tdap and Shingrix vaccines

## 2023-07-31 NOTE — Patient Instructions (Addendum)
 Ms. Krouse , Thank you for taking time to come for your Medicare Wellness Visit. I appreciate your ongoing commitment to your health goals. Please review the following plan we discussed and let me know if I can assist you in the future.   Referrals/Orders/Follow-Ups/Clinician Recommendations: I have placed order for you mammogram (due 10/02/23) and bone density scan. These can be done at the same visit. Call [facility] @ [phone] to schedule at your convenience.  This is a list of the screening recommended for you and due dates:  Health Maintenance  Topic Date Due   COVID-19 Vaccine (6 - 2024-25 season) 02/02/2023   Mammogram  10/02/2023   DEXA scan (bone density measurement)  02/16/2024   Medicare Annual Wellness Visit  07/30/2024   Colon Cancer Screening  09/17/2029   Pneumonia Vaccine  Completed   Flu Shot  Completed   Hepatitis C Screening  Addressed   HPV Vaccine  Aged Out   DTaP/Tdap/Td vaccine  Discontinued   Zoster (Shingles) Vaccine  Discontinued    Advanced directives: (Declined) Advance directive discussed with you today. Even though you declined this today, please call our office should you change your mind, and we can give you the proper paperwork for you to fill out.  Next Medicare Annual Wellness Visit scheduled for next year: Yes, 08/12/24 @ 3:10pm (phone visit)

## 2023-08-10 NOTE — Patient Instructions (Signed)

## 2023-08-12 ENCOUNTER — Ambulatory Visit (INDEPENDENT_AMBULATORY_CARE_PROVIDER_SITE_OTHER): Payer: Self-pay | Admitting: Nurse Practitioner

## 2023-08-12 ENCOUNTER — Encounter: Payer: Self-pay | Admitting: Nurse Practitioner

## 2023-08-12 VITALS — BP 126/78 | HR 75 | Temp 97.7°F | Ht 64.0 in | Wt 187.4 lb

## 2023-08-12 DIAGNOSIS — E782 Mixed hyperlipidemia: Secondary | ICD-10-CM | POA: Diagnosis not present

## 2023-08-12 DIAGNOSIS — I10 Essential (primary) hypertension: Secondary | ICD-10-CM | POA: Diagnosis not present

## 2023-08-12 DIAGNOSIS — E6609 Other obesity due to excess calories: Secondary | ICD-10-CM | POA: Diagnosis not present

## 2023-08-12 DIAGNOSIS — N1831 Chronic kidney disease, stage 3a: Secondary | ICD-10-CM | POA: Diagnosis not present

## 2023-08-12 DIAGNOSIS — R809 Proteinuria, unspecified: Secondary | ICD-10-CM

## 2023-08-12 DIAGNOSIS — F5105 Insomnia due to other mental disorder: Secondary | ICD-10-CM | POA: Diagnosis not present

## 2023-08-12 DIAGNOSIS — G40802 Other epilepsy, not intractable, without status epilepticus: Secondary | ICD-10-CM | POA: Diagnosis not present

## 2023-08-12 DIAGNOSIS — E66811 Obesity, class 1: Secondary | ICD-10-CM

## 2023-08-12 DIAGNOSIS — M8588 Other specified disorders of bone density and structure, other site: Secondary | ICD-10-CM

## 2023-08-12 DIAGNOSIS — F320 Major depressive disorder, single episode, mild: Secondary | ICD-10-CM | POA: Diagnosis not present

## 2023-08-12 DIAGNOSIS — F419 Anxiety disorder, unspecified: Secondary | ICD-10-CM | POA: Diagnosis not present

## 2023-08-12 DIAGNOSIS — E039 Hypothyroidism, unspecified: Secondary | ICD-10-CM | POA: Diagnosis not present

## 2023-08-12 LAB — MICROALBUMIN, URINE WAIVED
Creatinine, Urine Waived: 300 mg/dL (ref 10–300)
Microalb, Ur Waived: 80 mg/L — ABNORMAL HIGH (ref 0–19)

## 2023-08-12 NOTE — Assessment & Plan Note (Signed)
 Ongoing with DEXA last in 2020 -- repeat with mammogram this year, she will schedule.  Continue Vit D daily and recommend fall precautions.  Check Vit D level annually.

## 2023-08-12 NOTE — Assessment & Plan Note (Addendum)
 Ongoing.  Continue Losartan for kidney protection, check urine ALB today.

## 2023-08-12 NOTE — Addendum Note (Signed)
 Addended by: Aura Dials T on: 08/12/2023 11:19 AM   Modules accepted: Level of Service

## 2023-08-12 NOTE — Assessment & Plan Note (Signed)
BMI 32.17.  Recommended eating smaller high protein, low fat meals more frequently and exercising 30 mins a day 5 times a week with a goal of 10-15lb weight loss in the next 3 months. Patient voiced their understanding and motivation to adhere to these recommendations.

## 2023-08-12 NOTE — Assessment & Plan Note (Addendum)
 Chronic, stable CKD 3a.  Avoid NSAIDs.  Continue Losartan for kidney protection.  Labs: CMP and urine ALB.  Consider nephrology referral if any decline.  Renal dose medications as needed.

## 2023-08-12 NOTE — Assessment & Plan Note (Signed)
Chronic, ongoing.  Continue current medication regimen and adjust as needed.  Lipid panel today.  Return in 6 months. 

## 2023-08-12 NOTE — Assessment & Plan Note (Signed)
Chronic, ongoing.  Continue current medication regimen, Remeron, and adjust as needed.   

## 2023-08-12 NOTE — Assessment & Plan Note (Signed)
 Chronic, ongoing.  Continue current medication regimen and adjust as needed.  Saw endocrinology in past, will return to them if needed.  Thyroid labs up to date.

## 2023-08-12 NOTE — Progress Notes (Signed)
 BP 126/78 (BP Location: Left Arm, Patient Position: Sitting, Cuff Size: Normal)   Pulse 75   Temp 97.7 F (36.5 C) (Oral)   Ht 5\' 4"  (1.626 m)   Wt 187 lb 6.4 oz (85 kg)   SpO2 98%   BMI 32.17 kg/m    Subjective:    Patient ID: Rachel Cox, female    DOB: 07-06-1950, 73 y.o.   MRN: [id number]  HPI: Rachel Cox is a 73 y.o. female  Chief Complaint  Patient presents with   Hypertension   Chronic Kidney Disease   Hyperlipidemia   Hypothyroidism   Seizures   Anxiety   HYPERTENSION / HYPERLIPIDEMIA Continues to take Losartan 100 MG daily, HCTZ 12.5 MG daily, and Atorvastatin 40 MG daily.    Eye exam every 4 months due to open-angle glaucoma, bilateral -- no recent changes.   Satisfied with current treatment? yes Duration of hypertension: chronic BP monitoring frequency: not recently BP range:  BP medication side effects: yes Duration of hyperlipidemia: chronic Cholesterol medication side effects: no Cholesterol supplements: none Medication compliance: good compliance Aspirin: no Recent stressors: no Recurrent headaches: no Visual changes: no Palpitations: no Dyspnea: no Chest pain: no Lower extremity edema: no Dizzy/lightheaded: no   CHRONIC KIDNEY DISEASE CKD status: stable Medications renally dose: yes Previous renal evaluation: no Pneumovax:  Up to Date Influenza Vaccine:  Up to Date   OSTEOPENIA Satisfied with current treatment?: yes Adequate calcium & vitamin D: yes Weight bearing exercises: yes   SEIZURE DISORDER Follows with neurology, last visit 05/06/23 with no changes made. No seizures since 2016.  Keppra 250 MG at bedtime, yearly level checks have been stable.  Neurology recently told her she could stop Keppra, but she wishes to remain on this.   HYPOTHYROIDISM Thyroid control status:stable Satisfied with current treatment? yes Medication side effects: no Medication compliance: good compliance Etiology of hypothyroidism:  Recent dose  adjustment:no Fatigue: no Cold intolerance: occasional Heat intolerance: no Weight gain: no Weight loss: no Constipation: no Diarrhea/loose stools: no Palpitations: no Lower extremity edema: no Anxiety/depressed mood: no    ANXIETY/STRESS Takes Lexapro and Remeron 30 MG for mood and insomnia.  Does have some increased stressors at present with mother's healthy doing poorly. Duration:stable Anxious mood: yes Excessive worrying: yes Irritability: no  Sweating: no Nausea: no Palpitations:no Hyperventilation: no Panic attacks: no Agoraphobia: no  Obscessions/compulsions: no Depressed mood: no    07/31/2023    3:16 PM 02/12/2023    8:31 AM 08/08/2022    9:44 AM 07/22/2022    3:04 PM 02/07/2022    8:05 AM  Depression screen PHQ 2/9  Decreased Interest 0 1 1 0 0  Down, Depressed, Hopeless 0 0 0 0 0  PHQ - 2 Score 0 1 1 0 0  Altered sleeping 0 0 1 0 0  Tired, decreased energy 1 0 0 0 0  Change in appetite 0 0 0 0 0  Feeling bad or failure about yourself  0 0 0 0 0  Trouble concentrating 0 0 0 0 0  Moving slowly or fidgety/restless 0 0 0 0 0  Suicidal thoughts 0 0 0 0 0  PHQ-9 Score 1 1 2  0 0  Difficult doing work/chores  Not difficult at all Not difficult at all Not difficult at all Not difficult at all      02/12/2023    8:31 AM 08/08/2022    9:44 AM 02/07/2022    8:05 AM 08/06/2021  8:23 AM  GAD 7 : Generalized Anxiety Score  Nervous, Anxious, on Edge 0 0 0 0  Control/stop worrying 0 0 0 0  Worry too much - different things 0 0 0 0  Trouble relaxing 0 0 0 0  Restless 0 0 0 0  Easily annoyed or irritable 0 0 0 0  Afraid - awful might happen 0 0 0 0  Total GAD 7 Score 0 0 0 0  Anxiety Difficulty Not difficult at all Not difficult at all Not difficult at all Not difficult at all   Relevant past medical, surgical, family and social history reviewed and updated as indicated. Interim medical history since our last visit reviewed. Allergies and medications reviewed and  updated.  Review of Systems  Constitutional:  Negative for activity change, appetite change, diaphoresis, fatigue and fever.  Respiratory:  Negative for cough, chest tightness, shortness of breath and wheezing.   Cardiovascular:  Negative for chest pain, palpitations and leg swelling.  Gastrointestinal: Negative.   Endocrine: Negative for cold intolerance and heat intolerance.  Neurological: Negative.   Psychiatric/Behavioral: Negative.      Per HPI unless specifically indicated above     Objective:    BP 126/78 (BP Location: Left Arm, Patient Position: Sitting, Cuff Size: Normal)   Pulse 75   Temp 97.7 F (36.5 C) (Oral)   Ht 5\' 4"  (1.626 m)   Wt 187 lb 6.4 oz (85 kg)   SpO2 98%   BMI 32.17 kg/m   Wt Readings from Last 3 Encounters:  08/12/23 187 lb 6.4 oz (85 kg)  07/31/23 185 lb (83.9 kg)  02/12/23 185 lb 9.6 oz (84.2 kg)    Physical Exam Vitals and nursing note reviewed.  Constitutional:      General: She is awake. She is not in acute distress.    Appearance: She is well-developed and well-groomed. She is obese. She is not ill-appearing or toxic-appearing.  HENT:     Head: Normocephalic.     Right Ear: Hearing and external ear normal.     Left Ear: Hearing and external ear normal.  Eyes:     General: Lids are normal.        Right eye: No discharge.        Left eye: No discharge.     Conjunctiva/sclera: Conjunctivae normal.     Pupils: Pupils are equal, round, and reactive to light.  Neck:     Thyroid: No thyromegaly.     Vascular: No carotid bruit.  Cardiovascular:     Rate and Rhythm: Normal rate and regular rhythm.     Heart sounds: Normal heart sounds. No murmur heard.    No gallop.  Pulmonary:     Effort: Pulmonary effort is normal. No accessory muscle usage or respiratory distress.     Breath sounds: Normal breath sounds.  Abdominal:     General: Bowel sounds are normal. There is no distension.     Palpations: Abdomen is soft.     Tenderness: There  is no abdominal tenderness.  Musculoskeletal:     Cervical back: Normal range of motion and neck supple.     Right lower leg: No edema.     Left lower leg: No edema.  Lymphadenopathy:     Cervical: No cervical adenopathy.  Skin:    General: Skin is warm and dry.  Neurological:     Mental Status: She is alert and oriented to person, place, and time.     Deep Tendon  Reflexes: Reflexes are normal and symmetric.     Reflex Scores:      Brachioradialis reflexes are 2+ on the right side and 2+ on the left side.      Patellar reflexes are 2+ on the right side and 2+ on the left side. Psychiatric:        Attention and Perception: Attention normal.        Mood and Affect: Mood normal.        Speech: Speech normal.        Behavior: Behavior normal. Behavior is cooperative.        Thought Content: Thought content normal.    Results for orders placed or performed in visit on 02/12/23  CBC with Differential/Platelet   Collection Time: 02/12/23  9:04 AM  Result Value Ref Range   WBC 5.3 3.4 - 10.8 x10E3/uL   RBC 4.78 3.77 - 5.28 x10E6/uL   Hemoglobin 13.1 11.1 - 15.9 g/dL   Hematocrit 40.9 81.1 - 46.6 %   MCV 88 79 - 97 fL   MCH 27.4 26.6 - 33.0 pg   MCHC 31.3 (L) 31.5 - 35.7 g/dL   RDW 91.4 78.2 - 95.6 %   Platelets 262 150 - 450 x10E3/uL   Neutrophils 47 Not Estab. %   Lymphs 36 Not Estab. %   Monocytes 13 Not Estab. %   Eos 3 Not Estab. %   Basos 1 Not Estab. %   Neutrophils Absolute 2.5 1.4 - 7.0 x10E3/uL   Lymphocytes Absolute 1.9 0.7 - 3.1 x10E3/uL   Monocytes Absolute 0.7 0.1 - 0.9 x10E3/uL   EOS (ABSOLUTE) 0.1 0.0 - 0.4 x10E3/uL   Basophils Absolute 0.1 0.0 - 0.2 x10E3/uL   Immature Granulocytes 0 Not Estab. %   Immature Grans (Abs) 0.0 0.0 - 0.1 x10E3/uL  Comprehensive metabolic panel   Collection Time: 02/12/23  9:04 AM  Result Value Ref Range   Glucose 98 70 - 99 mg/dL   BUN 17 8 - 27 mg/dL   Creatinine, Ser 2.13 (H) 0.57 - 1.00 mg/dL   eGFR 47 (L) >08 MV/HQI/6.96    BUN/Creatinine Ratio 14 12 - 28   Sodium 143 134 - 144 mmol/L   Potassium 3.6 3.5 - 5.2 mmol/L   Chloride 105 96 - 106 mmol/L   CO2 23 20 - 29 mmol/L   Calcium 10.0 8.7 - 10.3 mg/dL   Total Protein 6.8 6.0 - 8.5 g/dL   Albumin 4.4 3.8 - 4.8 g/dL   Globulin, Total 2.4 1.5 - 4.5 g/dL   Bilirubin Total 0.5 0.0 - 1.2 mg/dL   Alkaline Phosphatase 82 44 - 121 IU/L   AST 14 0 - 40 IU/L   ALT 11 0 - 32 IU/L  Lipid Panel w/o Chol/HDL Ratio   Collection Time: 02/12/23  9:04 AM  Result Value Ref Range   Cholesterol, Total 169 100 - 199 mg/dL   Triglycerides 295 0 - 149 mg/dL   HDL 72 >28 mg/dL   VLDL Cholesterol Cal 18 5 - 40 mg/dL   LDL Chol Calc (NIH) 79 0 - 99 mg/dL  TSH   Collection Time: 02/12/23  9:04 AM  Result Value Ref Range   TSH 1.580 0.450 - 4.500 uIU/mL  Magnesium   Collection Time: 02/12/23  9:04 AM  Result Value Ref Range   Magnesium 2.0 1.6 - 2.3 mg/dL  T4, free   Collection Time: 02/12/23  9:04 AM  Result Value Ref Range   Free T4 1.55  0.82 - 1.77 ng/dL  Ferritin   Collection Time: 02/12/23  9:04 AM  Result Value Ref Range   Ferritin 9 (L) 15 - 150 ng/mL  Iron and TIBC   Collection Time: 02/12/23  9:04 AM  Result Value Ref Range   Total Iron Binding Capacity 365 250 - 450 ug/dL   UIBC 401 027 - 253 ug/dL   Iron 48 27 - 664 ug/dL   Iron Saturation 13 (L) 15 - 55 %  Vitamin B12   Collection Time: 02/12/23  9:04 AM  Result Value Ref Range   Vitamin B-12 729 232 - 1,245 pg/mL  VITAMIN D 25 Hydroxy (Vit-D Deficiency, Fractures)   Collection Time: 02/12/23  9:04 AM  Result Value Ref Range   Vit D, 25-Hydroxy 42.5 30.0 - 100.0 ng/mL      Assessment & Plan:   Problem List Items Addressed This Visit       Cardiovascular and Mediastinum   Essential hypertension   Chronic, stable. Initial BP elevated today, but repeat at goal for age.  Recommend she monitor BP at least a few mornings a week at home and document.  DASH diet at home.  Continue current  medication regimen and adjust as needed.  Labs: urine ALB, CMP.  Refills up to date.  Return in 6 months.       Relevant Orders   Comprehensive metabolic panel     Endocrine   Hypothyroidism   Chronic, ongoing.  Continue current medication regimen and adjust as needed.  Saw endocrinology in past, will return to them if needed.  Thyroid labs up to date.        Nervous and Auditory   Epilepsy (HCC)   Chronic, stable.  Continue current medication regimen as recommended by neuro and collaboration with them.  CMP and Keppra level today.  Recent neuro note reviewed.      Relevant Orders   Levetiracetam level     Musculoskeletal and Integument   Osteopenia (Chronic)   Ongoing with DEXA last in 2020 -- repeat with mammogram this year, she will schedule.  Continue Vit D daily and recommend fall precautions.  Check Vit D level annually.        Genitourinary   CKD (chronic kidney disease) stage 3, GFR 30-59 ml/min (HCC) - Primary (Chronic)   Chronic, stable CKD 3a.  Avoid NSAIDs.  Continue Losartan for kidney protection.  Labs: CMP and urine ALB.  Consider nephrology referral if any decline.  Renal dose medications as needed.      Relevant Orders   Comprehensive metabolic panel   Microalbumin, Urine Waived     Other   Depression, major, single episode, mild (HCC)   Chronic, ongoing - some exacerbation due to her mother's health.  Continue current medication regimen, Remeron and Lexapro, and adjust as needed.  Denies SI/HI.  Return in 6 months for follow-up.      Hyperlipemia   Chronic, ongoing.  Continue current medication regimen and adjust as needed.  Lipid panel today. Return in 6 months.      Relevant Orders   Comprehensive metabolic panel   Lipid Panel w/o Chol/HDL Ratio   Insomnia secondary to anxiety   Chronic, ongoing.  Continue current medication regimen, Remeron, and adjust as needed.        Microalbuminuria   Ongoing.  Continue Losartan for kidney protection,  check urine ALB today.      Relevant Orders   Microalbumin, Urine Waived   Obesity  BMI 32.17.  Recommended eating smaller high protein, low fat meals more frequently and exercising 30 mins a day 5 times a week with a goal of 10-15lb weight loss in the next 3 months. Patient voiced their understanding and motivation to adhere to these recommendations.         Follow up plan: Return in about 6 months (around 02/12/2024) for Annual Physical -- after 02/12/24.

## 2023-08-12 NOTE — Assessment & Plan Note (Signed)
Chronic, stable.  Continue current medication regimen as recommended by neuro and collaboration with them.  CMP and Keppra level today.  Recent neuro note reviewed.

## 2023-08-12 NOTE — Assessment & Plan Note (Signed)
 Chronic, stable. Initial BP elevated today, but repeat at goal for age.  Recommend she monitor BP at least a few mornings a week at home and document.  DASH diet at home.  Continue current medication regimen and adjust as needed.  Labs: urine ALB, CMP.  Refills up to date.  Return in 6 months.

## 2023-08-12 NOTE — Assessment & Plan Note (Signed)
 Chronic, ongoing - some exacerbation due to her mother's health.  Continue current medication regimen, Remeron and Lexapro, and adjust as needed.  Denies SI/HI.  Return in 6 months for follow-up.

## 2023-08-13 ENCOUNTER — Other Ambulatory Visit: Payer: Self-pay | Admitting: Nurse Practitioner

## 2023-08-13 ENCOUNTER — Encounter: Payer: Self-pay | Admitting: Nurse Practitioner

## 2023-08-13 DIAGNOSIS — E876 Hypokalemia: Secondary | ICD-10-CM

## 2023-08-13 LAB — COMPREHENSIVE METABOLIC PANEL
ALT: 12 IU/L (ref 0–32)
AST: 22 IU/L (ref 0–40)
Albumin: 4.2 g/dL (ref 3.8–4.8)
Alkaline Phosphatase: 82 IU/L (ref 44–121)
BUN/Creatinine Ratio: 12 (ref 12–28)
BUN: 14 mg/dL (ref 8–27)
Bilirubin Total: 0.7 mg/dL (ref 0.0–1.2)
CO2: 25 mmol/L (ref 20–29)
Calcium: 9.6 mg/dL (ref 8.7–10.3)
Chloride: 105 mmol/L (ref 96–106)
Creatinine, Ser: 1.15 mg/dL — ABNORMAL HIGH (ref 0.57–1.00)
Globulin, Total: 2.3 g/dL (ref 1.5–4.5)
Glucose: 91 mg/dL (ref 70–99)
Potassium: 3.2 mmol/L — ABNORMAL LOW (ref 3.5–5.2)
Sodium: 144 mmol/L (ref 134–144)
Total Protein: 6.5 g/dL (ref 6.0–8.5)
eGFR: 50 mL/min/{1.73_m2} — ABNORMAL LOW (ref >59)

## 2023-08-13 LAB — LIPID PANEL W/O CHOL/HDL RATIO
Cholesterol, Total: 164 mg/dL (ref 100–199)
HDL: 72 mg/dL (ref >39)
LDL Chol Calc (NIH): 74 mg/dL (ref 0–99)
Triglycerides: 101 mg/dL (ref 0–149)
VLDL Cholesterol Cal: 18 mg/dL (ref 5–40)

## 2023-08-13 LAB — LEVETIRACETAM LEVEL: Levetiracetam Lvl: 7.2 ug/mL — ABNORMAL LOW (ref 10.0–40.0)

## 2023-08-13 NOTE — Progress Notes (Signed)
 Contacted via MyChart -- lab only visit in one week please.

## 2023-08-20 NOTE — Progress Notes (Signed)
 Lab appt scheduled.

## 2023-08-22 ENCOUNTER — Other Ambulatory Visit

## 2023-08-22 DIAGNOSIS — Z780 Asymptomatic menopausal state: Secondary | ICD-10-CM

## 2023-08-22 DIAGNOSIS — Z1231 Encounter for screening mammogram for malignant neoplasm of breast: Secondary | ICD-10-CM

## 2023-08-22 DIAGNOSIS — E876 Hypokalemia: Secondary | ICD-10-CM

## 2023-08-23 ENCOUNTER — Encounter: Payer: Self-pay | Admitting: Nurse Practitioner

## 2023-08-23 LAB — POTASSIUM: Potassium: 3.6 mmol/L (ref 3.5–5.2)

## 2023-08-23 NOTE — Progress Notes (Signed)
 Contacted via MyChart   Potassium is normal this check.  Great news!!  Any questions?

## 2023-09-09 ENCOUNTER — Telehealth: Payer: Self-pay

## 2023-09-09 NOTE — Telephone Encounter (Signed)
 Copied from CRM [id number]. Topic: Referral - Question >> Sep 09, 2023 10:12 AM Fonda Kinder J wrote: Reason for CRM: Pt states she needs a mammogram and bone density order sent to [facility]. There is a referral placed with [facility] on it but the pt states they don't have it

## 2023-09-18 NOTE — Telephone Encounter (Signed)
 Order form filled out and faxed to [facility].   Called and notified patient that this was done for her.

## 2023-09-29 DIAGNOSIS — H40153 Residual stage of open-angle glaucoma, bilateral: Secondary | ICD-10-CM | POA: Diagnosis not present

## 2023-10-01 ENCOUNTER — Other Ambulatory Visit: Payer: Self-pay | Admitting: Nurse Practitioner

## 2023-10-01 DIAGNOSIS — E039 Hypothyroidism, unspecified: Secondary | ICD-10-CM

## 2023-10-04 NOTE — Telephone Encounter (Signed)
 Requested Prescriptions  Pending Prescriptions Disp Refills   levothyroxine  (SYNTHROID ) 75 MCG tablet [Pharmacy Med Name: Levothyroxine  Sodium 75 MCG Oral Tablet] 90 tablet 1    Sig: TAKE 1 TABLET BY MOUTH IN THE MORNING 30 MINUTES BEFORE EATING AND TAKING OTHER MEDICATION.     Endocrinology:  Hypothyroid Agents Passed - 10/04/2023  9:46 AM      Passed - TSH in normal range and within 360 days    TSH  Date Value Ref Range Status  02/12/2023 1.580 0.450 - 4.500 uIU/mL Final         Passed - Valid encounter within last 12 months    Recent Outpatient Visits           1 month ago Stage 3a chronic kidney disease (HCC)   [facility] O[place]ta, Lavelle Posey, NP

## 2023-10-07 DIAGNOSIS — M85852 Other specified disorders of bone density and structure, left thigh: Secondary | ICD-10-CM | POA: Diagnosis not present

## 2023-10-07 DIAGNOSIS — M810 Age-related osteoporosis without current pathological fracture: Secondary | ICD-10-CM | POA: Diagnosis not present

## 2023-10-07 DIAGNOSIS — Z1231 Encounter for screening mammogram for malignant neoplasm of breast: Secondary | ICD-10-CM | POA: Diagnosis not present

## 2024-01-22 DIAGNOSIS — H40153 Residual stage of open-angle glaucoma, bilateral: Secondary | ICD-10-CM | POA: Diagnosis not present

## 2024-02-15 NOTE — Patient Instructions (Incomplete)
 Be Involved in Caring For Your Health:  Taking Medications When medications are taken as directed, they can greatly improve your health. But if they are not taken as prescribed, they may not work. In some cases, not taking them correctly can be harmful. To help ensure your treatment remains effective and safe, understand your medications and how to take them. Bring your medications to each visit for review by your provider.  Your lab results, notes, and after visit summary will be available on My Chart. We strongly encourage you to use this feature. If lab results are abnormal the clinic will contact you with the appropriate steps. If the clinic does not contact you assume the results are satisfactory. You can always view your results on My Chart. If you have questions regarding your health or results, please contact the clinic during office hours. You can also ask questions on My Chart.  We at [facility] are grateful that you chose [place] to provide your care. We strive to provide evidence-based and compassionate care and are always looking for feedback. If you get a survey from the clinic please complete this so we can hear your opinions.  Heart-Healthy Eating Plan Many factors influence your heart health, including eating and exercise habits. Heart health is also called coronary health. Coronary risk increases with abnormal blood fat (lipid) levels. A heart-healthy eating plan includes limiting unhealthy fats, increasing healthy fats, limiting salt (sodium) intake, and making other diet and lifestyle changes. What is my plan? Your health care provider may recommend that: You limit your fat intake to _________% or less of your total calories each day. You limit your saturated fat intake to _________% or less of your total calories each day. You limit the amount of cholesterol in your diet to less than _________ mg per day. You limit the amount of sodium in your diet to less than _________  mg per day. What are tips for following this plan? Cooking Cook foods using methods other than frying. Baking, boiling, grilling, and broiling are all good options. Other ways to reduce fat include: Removing the skin from poultry. Removing all visible fats from meats. Steaming vegetables in water or broth. Meal planning  At meals, imagine dividing your plate into fourths: Fill one-half of your plate with vegetables and green salads. Fill one-fourth of your plate with whole grains. Fill one-fourth of your plate with lean protein foods. Eat 2-4 cups of vegetables per day. One cup of vegetables equals 1 cup (91 g) broccoli or cauliflower florets, 2 medium carrots, 1 large bell pepper, 1 large sweet potato, 1 large tomato, 1 medium white potato, 2 cups (150 g) raw leafy greens. Eat 1-2 cups of fruit per day. One cup of fruit equals 1 small apple, 1 large banana, 1 cup (237 g) mixed fruit, 1 large orange,  cup (82 g) dried fruit, 1 cup (240 mL) 100% fruit juice. Eat more foods that contain soluble fiber. Examples include apples, broccoli, carrots, beans, peas, and barley. Aim to get 25-30 g of fiber per day. Increase your consumption of legumes, nuts, and seeds to 4-5 servings per week. One serving of dried beans or legumes equals  cup (90 g) cooked, 1 serving of nuts is  oz (12 almonds, 24 pistachios, or 7 walnut halves), and 1 serving of seeds equals  oz (8 g). Fats Choose healthy fats more often. Choose monounsaturated and polyunsaturated fats, such as olive and canola oils, avocado oil, flaxseeds, walnuts, almonds, and seeds. Eat  more omega-3 fats. Choose salmon, mackerel, sardines, tuna, flaxseed oil, and ground flaxseeds. Aim to eat fish at least 2 times each week. Check food labels carefully to identify foods with trans fats or high amounts of saturated fat. Limit saturated fats. These are found in animal products, such as meats, butter, and cream. Plant sources of saturated fats  include palm oil, palm kernel oil, and coconut oil. Avoid foods with partially hydrogenated oils in them. These contain trans fats. Examples are stick margarine, some tub margarines, cookies, crackers, and other baked goods. Avoid fried foods. General information Eat more home-cooked food and less restaurant, buffet, and fast food. Limit or avoid alcohol. Limit foods that are high in added sugar and simple starches such as foods made using white refined flour (white breads, pastries, sweets). Lose weight if you are overweight. Losing just 5-10% of your body weight can help your overall health and prevent diseases such as diabetes and heart disease. Monitor your sodium intake, especially if you have high blood pressure. Talk with your health care provider about your sodium intake. Try to incorporate more vegetarian meals weekly. What foods should I eat? Fruits All fresh, canned (in natural juice), or frozen fruits. Vegetables Fresh or frozen vegetables (raw, steamed, roasted, or grilled). Green salads. Grains Most grains. Choose whole wheat and whole grains most of the time. Rice and pasta, including brown rice and pastas made with whole wheat. Meats and other proteins Lean, well-trimmed beef, veal, pork, and lamb. Chicken and [place] without skin. All fish and shellfish. Wild duck, rabbit, pheasant, and venison. Egg whites or low-cholesterol egg substitutes. Dried beans, peas, lentils, and tofu. Seeds and most nuts. Dairy Low-fat or nonfat cheeses, including ricotta and mozzarella. Skim or 1% milk (liquid, powdered, or evaporated). Buttermilk made with low-fat milk. Nonfat or low-fat yogurt. Fats and oils Non-hydrogenated (trans-free) margarines. Vegetable oils, including soybean, sesame, sunflower, olive, avocado, peanut, safflower, corn, canola, and cottonseed. Salad dressings or mayonnaise made with a vegetable oil. Beverages Water (mineral or sparkling). Coffee and tea. Unsweetened ice  tea. Diet beverages. Sweets and desserts Sherbet, gelatin, and fruit ice. Small amounts of dark chocolate. Limit all sweets and desserts. Seasonings and condiments All seasonings and condiments. The items listed above may not be a complete list of foods and beverages you can eat. Contact a dietitian for more options. What foods should I avoid? Fruits Canned fruit in heavy syrup. Fruit in cream or butter sauce. Fried fruit. Limit coconut. Vegetables Vegetables cooked in cheese, cream, or butter sauce. Fried vegetables. Grains Breads made with saturated or trans fats, oils, or whole milk. Croissants. Sweet rolls. Donuts. High-fat crackers, such as cheese crackers and chips. Meats and other proteins Fatty meats, such as hot dogs, ribs, sausage, bacon, rib-eye roast or steak. High-fat deli meats, such as salami and bologna. Caviar. Domestic duck and goose. Organ meats, such as liver. Dairy Cream, sour cream, cream cheese, and creamed cottage cheese. Whole-milk cheeses. Whole or 2% milk (liquid, evaporated, or condensed). Whole buttermilk. Cream sauce or high-fat cheese sauce. Whole-milk yogurt. Fats and oils Meat fat, or shortening. Cocoa butter, hydrogenated oils, palm oil, coconut oil, palm kernel oil. Solid fats and shortenings, including bacon fat, salt pork, lard, and butter. Nondairy cream substitutes. Salad dressings with cheese or sour cream. Beverages Regular sodas and any drinks with added sugar. Sweets and desserts Frosting. Pudding. Cookies. Cakes. Pies. Milk chocolate or white chocolate. Buttered syrups. Full-fat ice cream or ice cream drinks. The items listed above may  not be a complete list of foods and beverages to avoid. Contact a dietitian for more information. Summary Heart-healthy meal planning includes limiting unhealthy fats, increasing healthy fats, limiting salt (sodium) intake and making other diet and lifestyle changes. Lose weight if you are overweight. Losing just  5-10% of your body weight can help your overall health and prevent diseases such as diabetes and heart disease. Focus on eating a balance of foods, including fruits and vegetables, low-fat or nonfat dairy, lean protein, nuts and legumes, whole grains, and heart-healthy oils and fats. This information is not intended to replace advice given to you by your health care provider. Make sure you discuss any questions you have with your health care provider. Document Revised: 06/25/2021 Document Reviewed: 06/25/2021 Elsevier Patient Education  2024 [employer].

## 2024-02-18 ENCOUNTER — Encounter: Admitting: Nurse Practitioner

## 2024-02-18 DIAGNOSIS — I10 Essential (primary) hypertension: Secondary | ICD-10-CM

## 2024-02-18 DIAGNOSIS — M8588 Other specified disorders of bone density and structure, other site: Secondary | ICD-10-CM

## 2024-02-18 DIAGNOSIS — N1831 Chronic kidney disease, stage 3a: Secondary | ICD-10-CM

## 2024-02-18 DIAGNOSIS — D508 Other iron deficiency anemias: Secondary | ICD-10-CM

## 2024-02-18 DIAGNOSIS — E039 Hypothyroidism, unspecified: Secondary | ICD-10-CM

## 2024-02-18 DIAGNOSIS — F320 Major depressive disorder, single episode, mild: Secondary | ICD-10-CM

## 2024-02-18 DIAGNOSIS — E782 Mixed hyperlipidemia: Secondary | ICD-10-CM

## 2024-02-18 DIAGNOSIS — E538 Deficiency of other specified B group vitamins: Secondary | ICD-10-CM

## 2024-02-18 DIAGNOSIS — G40802 Other epilepsy, not intractable, without status epilepticus: Secondary | ICD-10-CM

## 2024-02-18 DIAGNOSIS — F419 Anxiety disorder, unspecified: Secondary | ICD-10-CM

## 2024-02-18 DIAGNOSIS — Z23 Encounter for immunization: Secondary | ICD-10-CM

## 2024-02-18 DIAGNOSIS — E66811 Obesity, class 1: Secondary | ICD-10-CM

## 2024-02-18 DIAGNOSIS — Z0000 Encounter for general adult medical examination without abnormal findings: Secondary | ICD-10-CM

## 2024-02-18 NOTE — Telephone Encounter (Signed)
 Spoke with patient and rescheduled today's appointment with her for 03/17/2024 @ 8:40 am.

## 2024-03-07 ENCOUNTER — Other Ambulatory Visit: Payer: Self-pay | Admitting: Nurse Practitioner

## 2024-03-09 NOTE — Telephone Encounter (Signed)
 Requested Prescriptions  Pending Prescriptions Disp Refills   atorvastatin  (LIPITOR) 40 MG tablet [Pharmacy Med Name: Atorvastatin  Calcium  40 MG Oral Tablet] 90 tablet 1    Sig: TAKE 1 TABLET BY MOUTH AT BEDTIME     Cardiovascular:  Antilipid - Statins Failed - 03/09/2024 11:44 AM      Failed - Lipid Panel in normal range within the last 12 months    Cholesterol, Total  Date Value Ref Range Status  08/12/2023 164 100 - 199 mg/dL Final   LDL Cholesterol (Calc)  Date Value Ref Range Status  07/13/2018 103 (H) mg/dL (calc) Final    Comment:    Reference range: <100 . Desirable range <100 mg/dL for primary prevention;   <70 mg/dL for patients with CHD or diabetic patients  with > or = 2 CHD risk factors. SABRA LDL-C is now calculated using the Martin-Hopkins  calculation, which is a validated novel method providing  better accuracy than the Friedewald equation in the  estimation of LDL-C.  Gladis APPLETHWAITE et al. SANDREA. 7986;689(80): 2061-2068  (http://education.[facility].com/faq/FAQ164)    LDL Chol Calc (NIH)  Date Value Ref Range Status  08/12/2023 74 0 - 99 mg/dL Final   HDL  Date Value Ref Range Status  08/12/2023 72 >39 mg/dL Final   Triglycerides  Date Value Ref Range Status  08/12/2023 101 0 - 149 mg/dL Final         Passed - Patient is not pregnant      Passed - Valid encounter within last 12 months    Recent Outpatient Visits           7 months ago Stage 3a chronic kidney disease (HCC)   [facility] [place], Melanie DASEN, NP

## 2024-03-14 NOTE — Patient Instructions (Incomplete)
 Be Involved in Caring For Your Health:  Taking Medications When medications are taken as directed, they can greatly improve your health. But if they are not taken as prescribed, they may not work. In some cases, not taking them correctly can be harmful. To help ensure your treatment remains effective and safe, understand your medications and how to take them. Bring your medications to each visit for review by your provider.  Your lab results, notes, and after visit summary will be available on My Chart. We strongly encourage you to use this feature. If lab results are abnormal the clinic will contact you with the appropriate steps. If the clinic does not contact you assume the results are satisfactory. You can always view your results on My Chart. If you have questions regarding your health or results, please contact the clinic during office hours. You can also ask questions on My Chart.  We at [facility] are grateful that you chose us  to provide your care. We strive to provide evidence-based and compassionate care and are always looking for feedback. If you get a survey from the clinic please complete this so we can hear your opinions.   Chronic Kidney Disease: Eating Plan Chronic kidney disease (CKD) is when your kidneys aren't working well. They can't remove waste, fluids, and other substances from your blood. When these substances build up, they can worsen kidney damage and affect your health. Eating certain foods can lead to a buildup of these substances. Changing your diet can help prevent more kidney damage. Diet changes may also delay dialysis or even keep you from needing it. What nutrients should I limit? Work with your health care team and an expert in healthy eating (dietitian) to make a meal plan that's right for you. Foods you can eat and foods you should limit or avoid will depend on: The stage of your kidney disease. Any other conditions you have. The items listed below  are not a complete list. Talk with a dietitian to learn what's best for you. Potassium Potassium affects how well your heart beats. Too much potassium in your blood can cause an irregular heartbeat or even a heart attack. You may need to limit foods that are high in potassium, such as: Liquid milk and soy milk. Salt substitutes that contain potassium. Fruits like: Bananas. Apricots. Melon. Prunes and raisins. Kiwi. Nectarines and oranges. Vegetables, such as: Potatoes, sweet potatoes, and yams. Tomatoes. Leafy greens. Beets. Avocado. Pumpkin and winter squash. Beans, like lima beans. Nuts. Phosphorus Phosphorus is a mineral found in your bones. You need a balance between calcium  and phosphorus to build and maintain healthy bones.  Too much added phosphorus from the foods you eat can pull calcium  from your bones. Losing calcium  can make your bones weak and more likely to break. Too much phosphorus can also make your skin itch. You may need to limit foods that are high in phosphorus or that have added phosphorus, such as: Liquid milk and dairy products. Dark-colored sodas or soft drinks. Bran cereals and oatmeal. Protein  Protein helps your body make and keep muscle. Protein also helps to repair your body's cells and tissues.  One of the natural breakdown products of protein is a waste product called urea. When your kidneys aren't working well, they can't remove waste like urea. Reducing protein in your diet can help keep urea from building up in your blood. Depending on your stage of kidney disease, you may need to eat smaller portions of foods  that are high in protein. Sources of animal protein include: Meat (all types). Fish and seafood. Poultry. Eggs. Dairy. Other protein foods include: Beans and legumes. Nuts and nut butter. Soy, like tofu.  Sodium Salt (sodium) helps to keep a healthy balance of fluids in your body. Too much salt can increase your blood pressure,  which can harm your heart and lungs. Extra salt can also cause your body to keep too much fluid, making your kidneys work harder. You may need to limit or avoid foods that are high in salt, such as: Salt seasonings. Soy and teriyaki sauce. Meats that are: Packaged. Precooked. Cured. Processed. Salted crackers and snack foods. Fast food. Canned soups and foods. Pickled foods. Boxed mixes or ready-to-eat boxed meals and side dishes. Bottled dressings, sauces, and marinades. Talk with your dietitian about how much potassium, phosphorus, protein, and salt you may have each day. What are tips for following this plan? Reading food labels  Check the amount of salt in foods. Limit foods that have salt listed among the first five ingredients. Try to eat low-salt foods. Check the ingredient list for added phosphorus or potassium. Phos in an ingredient is a sign that phosphorus has been added. Do not buy foods that are calcium -enriched or that have calcium  added to them (fortified). Buy canned vegetables and beans that say no salt added. Rinse them before eating. Lifestyle Limit the amount of protein you eat from animal sources each day. Focus on protein from plant sources, like tofu and dried beans, peas, and lentils. Do not add salt to food when cooking or before eating. Do not eat star fruit. It can be toxic for people with kidney problems. Talk with your health care provider before taking any vitamin or mineral supplements. If told by your provider: Track how much liquid you have so you can avoid drinking too much. Try to eat foods that are made mostly from water , like gelatin, ice cream, soups, and juicy fruits and vegetables. If you have diabetes and chronic kidney disease: If you have diabetes and CKD, you need to keep your blood sugar (glucose) in the target range recommended by your provider. Follow your diabetes management plan. This may include: Checking your blood glucose  regularly. Taking medicines by mouth, or taking insulin, or both. Exercising for at least 30 minutes on 5 or more days each week, or as told by your provider. Tracking how many servings of carbohydrates you eat at each meal. Not using orange juice to treat low blood sugars. Instead, use apple juice, cranberry juice, or clear soda. You may be given guidelines on what foods and nutrients you may eat, and how much you can have each day. This depends on your stage of kidney disease and whether you have high blood pressure. Follow the meal plan your dietitian gives you. Where to find more information [employer] of Diabetes and Digestive and Kidney Diseases: StageSync.si National Kidney Foundation: kidney.org This information is not intended to replace advice given to you by your health care provider. Make sure you discuss any questions you have with your health care provider. Document Revised: 12/31/2022 Document Reviewed: 12/31/2022 Elsevier Patient Education  2024 [employer].

## 2024-03-17 ENCOUNTER — Encounter: Payer: Self-pay | Admitting: Nurse Practitioner

## 2024-03-17 ENCOUNTER — Ambulatory Visit (INDEPENDENT_AMBULATORY_CARE_PROVIDER_SITE_OTHER): Admitting: Nurse Practitioner

## 2024-03-17 VITALS — BP 122/78 | HR 58 | Temp 98.1°F | Resp 16 | Ht 64.02 in | Wt 188.4 lb

## 2024-03-17 DIAGNOSIS — Z0000 Encounter for general adult medical examination without abnormal findings: Secondary | ICD-10-CM

## 2024-03-17 DIAGNOSIS — E66811 Obesity, class 1: Secondary | ICD-10-CM

## 2024-03-17 DIAGNOSIS — D508 Other iron deficiency anemias: Secondary | ICD-10-CM

## 2024-03-17 DIAGNOSIS — G40802 Other epilepsy, not intractable, without status epilepticus: Secondary | ICD-10-CM

## 2024-03-17 DIAGNOSIS — E039 Hypothyroidism, unspecified: Secondary | ICD-10-CM

## 2024-03-17 DIAGNOSIS — Z23 Encounter for immunization: Secondary | ICD-10-CM

## 2024-03-17 DIAGNOSIS — E6609 Other obesity due to excess calories: Secondary | ICD-10-CM

## 2024-03-17 DIAGNOSIS — E538 Deficiency of other specified B group vitamins: Secondary | ICD-10-CM | POA: Diagnosis not present

## 2024-03-17 DIAGNOSIS — M8588 Other specified disorders of bone density and structure, other site: Secondary | ICD-10-CM

## 2024-03-17 DIAGNOSIS — F320 Major depressive disorder, single episode, mild: Secondary | ICD-10-CM

## 2024-03-17 DIAGNOSIS — E782 Mixed hyperlipidemia: Secondary | ICD-10-CM

## 2024-03-17 DIAGNOSIS — I10 Essential (primary) hypertension: Secondary | ICD-10-CM | POA: Diagnosis not present

## 2024-03-17 DIAGNOSIS — N1831 Chronic kidney disease, stage 3a: Secondary | ICD-10-CM

## 2024-03-17 MED ORDER — LOSARTAN POTASSIUM 100 MG PO TABS: 100.0000 mg | ORAL_TABLET | Freq: Every day | ORAL | 4 refills | Status: AC

## 2024-03-17 MED ORDER — PANTOPRAZOLE SODIUM 40 MG PO TBEC: 40.0000 mg | DELAYED_RELEASE_TABLET | Freq: Every day | ORAL | 4 refills | Status: AC

## 2024-03-17 MED ORDER — ESCITALOPRAM OXALATE 10 MG PO TABS: 10.0000 mg | ORAL_TABLET | Freq: Every day | ORAL | 4 refills | Status: AC

## 2024-03-17 MED ORDER — HYDROCHLOROTHIAZIDE 25 MG PO TABS: 12.5000 mg | ORAL_TABLET | Freq: Every day | ORAL | 4 refills | Status: AC

## 2024-03-17 MED ORDER — MIRTAZAPINE 30 MG PO TABS: 30.0000 mg | ORAL_TABLET | ORAL | 4 refills | Status: AC

## 2024-03-17 MED ORDER — LEVOTHYROXINE SODIUM 75 MCG PO TABS: 75.0000 ug | ORAL_TABLET | Freq: Every day | ORAL | 4 refills | Status: AC

## 2024-03-17 NOTE — Assessment & Plan Note (Signed)
 Chronic, ongoing.  Continue current medication regimen, Remeron  and Lexapro , and adjust as needed.  Denies SI/HI.  Return in 6 months for follow-up.

## 2024-03-17 NOTE — Assessment & Plan Note (Signed)
 Ongoing with DEXA last in May 2025.  Continue Vit D daily and recommend fall precautions.  Check Vit D level annually.

## 2024-03-17 NOTE — Assessment & Plan Note (Signed)
 Chronic, stable. Initial BP elevated today, but repeat at goal for age. Has some white coat syndrome.  Recommend she monitor BP at least a few mornings a week at home and document.  DASH diet at home.  Continue current medication regimen and adjust as needed.  Labs: CBC, CMP, TSH.  Refills sent.  Return in 6 months.

## 2024-03-17 NOTE — Assessment & Plan Note (Signed)
 Chronic, stable CKD 3a. First noted in September 2016.  Avoid NSAIDs.  Continue Losartan  for kidney protection.  Labs: CMP, CBC.  Consider nephrology referral if any decline.  Renal dose medications as needed.

## 2024-03-17 NOTE — Assessment & Plan Note (Signed)
Chronic, stable.  Check CBC, ferritin, iron, and B12 today.  Continue daily supplement and adjust plan of care as needed based on labs. 

## 2024-03-17 NOTE — Assessment & Plan Note (Signed)
 BMI 32.32.  Recommended eating smaller high protein, low fat meals more frequently and exercising 30 mins a day 5 times a week with a goal of 10-15lb weight loss in the next 3 months. Patient voiced their understanding and motivation to adhere to these recommendations.

## 2024-03-17 NOTE — Progress Notes (Signed)
 BP 122/78 (BP Location: Left Arm, Patient Position: Sitting, Cuff Size: Normal)   Pulse (!) 58   Temp 98.1 F (36.7 C) (Oral)   Resp 16   Ht 5' 4.02 (1.626 m)   Wt 188 lb 6.4 oz (85.5 kg)   SpO2 98%   BMI 32.32 kg/m    Subjective:    Patient ID: Rachel Cox, female    DOB: [birth year], 73 y.o.   MRN: [id number]  HPI: Rachel Cox is a 73 y.o. female presenting on 03/17/2024 for comprehensive medical examination. Current medical complaints include:none  She currently lives with: husband Menopausal Symptoms: no   SEIZURE DISORDER Continues to follow with neurology, last visit 05/06/23.  No seizure since 2016. Keppra  250 MG at bedtime, they fill this for her. They have her on Remeron  for sleep and continues Lexapro  for mood. These help her mood and sleep pattern.  HYPERTENSION / HYPERLIPIDEMIA Taking Losartan , HCTZ and Atorvastatin .   Satisfied with current treatment? yes Duration of hypertension: chronic BP monitoring frequency: rarely BP range: BP medication side effects: yes Duration of hyperlipidemia: chronic Cholesterol medication side effects: no Cholesterol supplements: none Medication compliance: good compliance Aspirin : no Recent stressors: no Recurrent headaches: no Visual changes: no Palpitations: no Dyspnea: no Chest pain: no Lower extremity edema: no Dizzy/lightheaded: no  The 10-year ASCVD risk score (Arnett DK, et al., 2019) is: 19.1%   Values used to calculate the score:     Age: 37 years     Clincally relevant sex: Female     Is Non-Hispanic African American: No     Diabetic: No     Tobacco smoker: No     Systolic Blood Pressure: 122 mmHg     Is BP treated: Yes     HDL Cholesterol: 72 mg/dL     Total Cholesterol: 164 mg/dL   GERD Taking Protonix  daily.  Has tried to reduce and had worsening heart burn. GERD control status: stable Satisfied with current treatment? yes Heartburn frequency: none Medication side effects: no  Medication  compliance: stable Dysphagia: no Odynophagia:  no Hematemesis: no Blood in stool: no EGD: yes    ANEMIA Not taking any supplements at present. Anemia status: stable Etiology of anemia: Duration of anemia treatment:  Compliance with treatment: good compliance Iron supplementation side effects: no Severity of anemia: mild Fatigue: no Decreased exercise tolerance: no  Dyspnea on exertion: no Palpitations: no Bleeding: no Pica: no    CHRONIC KIDNEY DISEASE (CKD 3a) CKD 3a noted on labs, fluctuates. Goes back to 03/02/2015 on labs. CKD status: stable Medications renally dose: yes Previous renal evaluation: no Pneumovax:  Up to Date Influenza Vaccine:  Up to Date   OSTEOPENIA Continues to be stable on DEXA 10/07/23.  Continues on daily supplements.  No recent falls or fractures. Satisfied with current treatment?: yes Adequate calcium  & vitamin D : yes Weight bearing exercises: yes    HYPOTHYROIDISM Taking Levothyroxine  75 MCG daily. Saw endo in past, with last visit 01/02/22. Thyroid  control status:stable Satisfied with current treatment? yes Medication side effects: no Medication compliance: good compliance Etiology of hypothyroidism:  Recent dose adjustment:no Fatigue: no Cold intolerance: yes Heat intolerance: no Weight gain: no Weight loss: no Constipation: no Diarrhea/loose stools: no Palpitations: no Lower extremity edema: no Anxiety/depressed mood: no    ANXIETY/STRESS Duration:stable Anxious mood: no  Excessive worrying: no Irritability: no  Sweating: no Nausea: no Palpitations:no Hyperventilation: no Panic attacks: no Agoraphobia: no  Obscessions/compulsions: no Depressed mood: no  03/17/2024    8:47 AM 07/31/2023    3:16 PM 02/12/2023    8:31 AM 08/08/2022    9:44 AM 07/22/2022    3:04 PM  Depression screen PHQ 2/9  Decreased Interest 0 0 1 1 0  Down, Depressed, Hopeless 0 0 0 0 0  PHQ - 2 Score 0 0 1 1 0  Altered sleeping 0 0 0 1 0  Tired,  decreased energy 0 1 0 0 0  Change in appetite 0 0 0 0 0  Feeling bad or failure about yourself  0 0 0 0 0  Trouble concentrating 0 0 0 0 0  Moving slowly or fidgety/restless 0 0 0 0 0  Suicidal thoughts 0 0 0 0 0  PHQ-9 Score 0 1 1 2  0  Difficult doing work/chores   Not difficult at all Not difficult at all Not difficult at all      03/17/2024    8:47 AM 02/12/2023    8:31 AM 08/08/2022    9:44 AM 02/07/2022    8:05 AM  GAD 7 : Generalized Anxiety Score  Nervous, Anxious, on Edge 0 0 0 0  Control/stop worrying 0 0 0 0  Worry too much - different things 0 0 0 0  Trouble relaxing 0 0 0 0  Restless 0 0 0 0  Easily annoyed or irritable 0 0 0 0  Afraid - awful might happen 0 0 0 0  Total GAD 7 Score 0 0 0 0  Anxiety Difficulty  Not difficult at all Not difficult at all Not difficult at all     Functional Status Survey: Is the patient deaf or have difficulty hearing?: No Does the patient have difficulty seeing, even when wearing glasses/contacts?: No Does the patient have difficulty concentrating, remembering, or making decisions?: No Does the patient have difficulty walking or climbing stairs?: No Does the patient have difficulty dressing or bathing?: No Does the patient have difficulty doing errands alone such as visiting a doctor's office or shopping?: No      03/17/2024    8:47 AM 07/31/2023    3:20 PM 02/12/2023    8:30 AM 08/08/2022    9:44 AM 07/22/2022    3:06 PM  Fall Risk   Falls in the past year? 0 0 0 0 0  Number falls in past yr: 0 0 0 0 0  Injury with Fall? 0 0 0 0 0  Risk for fall due to : No Fall Risks No Fall Risks No Fall Risks No Fall Risks No Fall Risks  Follow up Falls evaluation completed Falls prevention discussed;Falls evaluation completed Falls evaluation completed Falls evaluation completed Falls prevention discussed;Falls evaluation completed    Past Medical History:  Past Medical History:  Diagnosis Date   Allergic rhinitis    Allergy    Anemia     Anxiety    Depression    Epilepsy (HCC)    managed by Dr. Chares CURB 2015/2016   GERD (gastroesophageal reflux disease)    History of abnormal cervical Pap smear 1980's   Hyperglycemia    Hyperlipidemia    Hypertension    Hypothyroidism    Insomnia    Obesity (BMI 30.0-34.9) 01/06/2017   Osteopenia    Reflux esophagitis 10/31/2017   EGD    Surgical History:  Past Surgical History:  Procedure Laterality Date   COLONOSCOPY  Sept 2015   COLONOSCOPY WITH PROPOFOL  N/A 09/10/2017   Procedure: COLONOSCOPY WITH PROPOFOL ;  Surgeon: Dessa Purchase  W, MD;  Location: [facility];  Service: Endoscopy;  Laterality: N/A;   COLONOSCOPY WITH PROPOFOL  N/A 09/18/2022   Procedure: COLONOSCOPY WITH PROPOFOL ;  Surgeon: Tye Millet, DO;  Location: [facility];  Service: General;  Laterality: N/A;   Cryo Procedure  1980s   for abnormal pap   ESOPHAGOGASTRODUODENOSCOPY (EGD) WITH PROPOFOL  N/A 09/10/2017   Procedure: ESOPHAGOGASTRODUODENOSCOPY (EGD) WITH PROPOFOL ;  Surgeon: Dessa Reyes ORN, MD;  Location: [facility];  Service: Endoscopy;  Laterality: N/A;   ESOPHAGOGASTRODUODENOSCOPY (EGD) WITH PROPOFOL  N/A 10/29/2017   Procedure: ESOPHAGOGASTRODUODENOSCOPY (EGD) WITH PROPOFOL ;  Surgeon: Dessa Reyes ORN, MD;  Location: [facility];  Service: Endoscopy;  Laterality: N/A;   ESOPHAGOGASTRODUODENOSCOPY (EGD) WITH PROPOFOL  N/A 09/18/2022   Procedure: ESOPHAGOGASTRODUODENOSCOPY (EGD) WITH PROPOFOL ;  Surgeon: Tye Millet, DO;  Location: [facility];  Service: General;  Laterality: N/A;   EYE SURGERY Bilateral 2021   cataract removal   LIPOMA EXCISION N/A 12/12/2017   Procedure: EXCISION BACK LIPOMA;  Surgeon: Dessa Reyes ORN, MD;  Location: [facility];  Service: General;  Laterality: N/A;   TUBAL LIGATION  1969    Medications:  Current Outpatient Medications on File Prior to Visit  Medication Sig   atorvastatin  (LIPITOR) 40 MG tablet TAKE 1 TABLET BY MOUTH AT BEDTIME    Cholecalciferol  25 MCG (1000 UT) tablet Take 1 tablet (1,000 Units total) by mouth daily.   dorzolamide (TRUSOPT) 2 % ophthalmic solution 1 drop 2 (two) times daily.   ferrous sulfate  325 (65 FE) MG EC tablet Take 325 mg by mouth daily.   levETIRAcetam  (KEPPRA ) 250 MG tablet Take 250 mg by mouth at bedtime.    vitamin B-12 (CYANOCOBALAMIN ) 500 MCG tablet Take 500 mcg by mouth daily.   folic acid  (FOLVITE ) 400 MCG tablet Take 400 mcg by mouth daily. (Patient not taking: Reported on 03/17/2024)   No current facility-administered medications on file prior to visit.    Allergies:  Allergies  Allergen Reactions   Aspirin  Nausea Only   Codeine Palpitations    Pt vocalized   Flonase  [Fluticasone  Propionate] Other (See Comments)    Make glaucoma worse    Social History:  Social History   Socioeconomic History   Marital status: Married    Spouse name: Tonette   Number of children: 4   Years of education: Not on file   Highest education level: 10th grade  Occupational History   Occupation: Retired  Tobacco Use   Smoking status: Never   Smokeless tobacco: Never  Vaping Use   Vaping status: Never Used  Substance and Sexual Activity   Alcohol use: Yes    Comment: 1 glass every 6 mths   Drug use: No   Sexual activity: Not Currently    Birth control/protection: Post-menopausal  Other Topics Concern   Not on file  Social History Narrative   Not on file   Social Drivers of Health   Financial Resource Strain: Low Risk  (07/31/2023)   Overall Financial Resource Strain (CARDIA)    Difficulty of Paying Living Expenses: Not hard at all  Food Insecurity: No Food Insecurity (07/31/2023)   Hunger Vital Sign    Worried About Running Out of Food in the Last Year: Never true    Ran Out of Food in the Last Year: Never true  Transportation Needs: No Transportation Needs (07/31/2023)   PRAPARE - Administrator, Civil Service (Medical): No    Lack of Transportation (Non-Medical):  No  Physical Activity: Inactive (07/31/2023)   Exercise  Vital Sign    Days of Exercise per Week: 0 days    Minutes of Exercise per Session: 0 min  Stress: No Stress Concern Present (07/31/2023)   [employer] of Occupational Health - Occupational Stress Questionnaire    Feeling of Stress : Not at all  Social Connections: Moderately Integrated (07/31/2023)   Social Connection and Isolation Panel    Frequency of Communication with Friends and Family: More than three times a week    Frequency of Social Gatherings with Friends and Family: Twice a week    Attends Religious Services: More than 4 times per year    Active Member of [employer] or Organizations: No    Attends Banker Meetings: Never    Marital Status: Married  Catering manager Violence: Not At Risk (07/31/2023)   Humiliation, Afraid, Rape, and Kick questionnaire    Fear of Current or Ex-Partner: No    Emotionally Abused: No    Physically Abused: No    Sexually Abused: No   Social History   Tobacco Use  Smoking Status Never  Smokeless Tobacco Never   Social History   Substance and Sexual Activity  Alcohol Use Yes   Comment: 1 glass every 6 mths    Family History:  Family History  Problem Relation Age of Onset   Hyperlipidemia Mother    Hypertension Mother    CAD Mother    Thyroid  disease Mother    Heart disease Mother    COPD Father    Thyroid  disease Father    Cancer Brother        liver   Alcohol abuse Brother    Hypertension Brother    Liver disease Brother    Thyroid  disease Sister    Diabetes Neg Hx    Stroke Neg Hx     Past medical history, surgical history, medications, allergies, family history and social history reviewed with patient today and changes made to appropriate areas of the chart.   Review of Systems - negative All other ROS negative except what is listed above and in the HPI.      Objective:    BP 122/78 (BP Location: Left Arm, Patient Position: Sitting, Cuff Size:  Normal)   Pulse (!) 58   Temp 98.1 F (36.7 C) (Oral)   Resp 16   Ht 5' 4.02 (1.626 m)   Wt 188 lb 6.4 oz (85.5 kg)   SpO2 98%   BMI 32.32 kg/m   Wt Readings from Last 3 Encounters:  03/17/24 188 lb 6.4 oz (85.5 kg)  08/12/23 187 lb 6.4 oz (85 kg)  07/31/23 185 lb (83.9 kg)    Physical Exam Vitals and nursing note reviewed. Exam conducted with a chaperone present.  Constitutional:      General: She is awake. She is not in acute distress.    Appearance: She is well-developed and well-groomed. She is obese. She is not ill-appearing or toxic-appearing.  HENT:     Head: Normocephalic and atraumatic.     Right Ear: Hearing, tympanic membrane, ear canal and external ear normal. No drainage.     Left Ear: Hearing, tympanic membrane, ear canal and external ear normal. No drainage.     Nose: Nose normal.     Right Sinus: No maxillary sinus tenderness or frontal sinus tenderness.     Left Sinus: No maxillary sinus tenderness or frontal sinus tenderness.     Mouth/Throat:     Mouth: Mucous membranes are moist.  Pharynx: Oropharynx is clear. Uvula midline. No pharyngeal swelling, oropharyngeal exudate or posterior oropharyngeal erythema.  Eyes:     General: Lids are normal.        Right eye: No discharge.        Left eye: No discharge.     Extraocular Movements: Extraocular movements intact.     Conjunctiva/sclera: Conjunctivae normal.     Pupils: Pupils are equal, round, and reactive to light.     Visual Fields: Right eye visual fields normal and left eye visual fields normal.  Neck:     Thyroid : No thyromegaly.     Vascular: No carotid bruit.     Trachea: Trachea normal.  Cardiovascular:     Rate and Rhythm: Normal rate and regular rhythm.     Heart sounds: Normal heart sounds. No murmur heard.    No gallop.  Pulmonary:     Effort: Pulmonary effort is normal. No accessory muscle usage or respiratory distress.     Breath sounds: Normal breath sounds.  Chest:  Breasts:     Right: Normal.     Left: Normal.  Abdominal:     General: Bowel sounds are normal.     Palpations: Abdomen is soft. There is no hepatomegaly or splenomegaly.     Tenderness: There is no abdominal tenderness.  Musculoskeletal:        General: Normal range of motion.     Cervical back: Normal range of motion and neck supple.     Right lower leg: No edema.     Left lower leg: No edema.  Lymphadenopathy:     Head:     Right side of head: No submental, submandibular, tonsillar, preauricular or posterior auricular adenopathy.     Left side of head: No submental, submandibular, tonsillar, preauricular or posterior auricular adenopathy.     Cervical: No cervical adenopathy.     Upper Body:     Right upper body: No supraclavicular, axillary or pectoral adenopathy.     Left upper body: No supraclavicular, axillary or pectoral adenopathy.  Skin:    General: Skin is warm and dry.     Capillary Refill: Capillary refill takes less than 2 seconds.     Findings: No rash.  Neurological:     Mental Status: She is alert and oriented to person, place, and time.     Gait: Gait is intact.     Deep Tendon Reflexes: Reflexes are normal and symmetric.     Reflex Scores:      Brachioradialis reflexes are 2+ on the right side and 2+ on the left side.      Patellar reflexes are 2+ on the right side and 2+ on the left side. Psychiatric:        Attention and Perception: Attention normal.        Mood and Affect: Mood normal.        Speech: Speech normal.        Behavior: Behavior normal. Behavior is cooperative.        Thought Content: Thought content normal.        Judgment: Judgment normal.    Results for orders placed or performed in visit on 08/22/23  Potassium   Collection Time: 08/22/23  8:28 AM  Result Value Ref Range   Potassium 3.6 3.5 - 5.2 mmol/L      Assessment & Plan:   Problem List Items Addressed This Visit       Cardiovascular and Mediastinum   Essential hypertension  Chronic,  stable. Initial BP elevated today, but repeat at goal for age. Has some white coat syndrome.  Recommend she monitor BP at least a few mornings a week at home and document.  DASH diet at home.  Continue current medication regimen and adjust as needed.  Labs: CBC, CMP, TSH.  Refills sent.  Return in 6 months.       Relevant Medications   hydrochlorothiazide  (HYDRODIURIL ) 25 MG tablet   losartan  (COZAAR ) 100 MG tablet     Endocrine   Hypothyroidism   Chronic, ongoing.  Continue current medication regimen and adjust as needed.  Saw endocrinology in past, will return to them if needed.  Thyroid  labs today.      Relevant Medications   levothyroxine  (SYNTHROID ) 75 MCG tablet   Other Relevant Orders   T4, free   TSH     Nervous and Auditory   Epilepsy (HCC) - Primary   Chronic, stable.  Continue current medication regimen as recommended by neuro and collaboration with them.  CMP today.  Keppra  level next visit.  Recent neuro note reviewed.        Musculoskeletal and Integument   Osteopenia (Chronic)   Ongoing with DEXA last in May 2025.  Continue Vit D daily and recommend fall precautions.  Check Vit D level annually.      Relevant Orders   VITAMIN D  25 Hydroxy (Vit-D Deficiency, Fractures)     Genitourinary   CKD (chronic kidney disease) stage 3, GFR 30-59 ml/min (HCC) (Chronic)   Chronic, stable CKD 3a. First noted in September 2016.  Avoid NSAIDs.  Continue Losartan  for kidney protection.  Labs: CMP, CBC.  Consider nephrology referral if any decline.  Renal dose medications as needed.      Relevant Orders   Comprehensive metabolic panel with GFR   Magnesium     Other   Obesity   BMI 32.32.  Recommended eating smaller high protein, low fat meals more frequently and exercising 30 mins a day 5 times a week with a goal of 10-15lb weight loss in the next 3 months. Patient voiced their understanding and motivation to adhere to these recommendations.       Iron deficiency anemia    Chronic, stable.  Check CBC, ferritin, iron, and B12 today.  Continue daily supplement and adjust plan of care as needed based on labs.      Relevant Orders   Ferritin   Iron   CBC with Differential/Platelet   Hyperlipemia   Chronic, ongoing.  Continue current medication regimen and adjust as needed.  Lipid panel today. Return in 6 months.      Relevant Medications   hydrochlorothiazide  (HYDRODIURIL ) 25 MG tablet   losartan  (COZAAR ) 100 MG tablet   Other Relevant Orders   Comprehensive metabolic panel with GFR   Lipid Panel w/o Chol/HDL Ratio   Depression, major, single episode, mild   Chronic, ongoing.  Continue current medication regimen, Remeron  and Lexapro , and adjust as needed.  Denies SI/HI.  Return in 6 months for follow-up.      Relevant Medications   escitalopram  (LEXAPRO ) 10 MG tablet   mirtazapine  (REMERON ) 30 MG tablet   B12 deficiency   Chronic, ongoing.  Continue daily supplement and adjust as needed.  Check B12 level today.      Relevant Orders   CBC with Differential/Platelet   Vitamin B12   Other Visit Diagnoses       Flu vaccine need       Flu  vaccine today, educated patient.   Relevant Orders   Flu vaccine HIGH DOSE PF(Fluzone Trivalent) (Completed)     Encounter for annual physical exam       Annual physical today with labs and health maintenance reviewed, discussed with patient.        Follow up plan: Return in about 6 months (around 09/15/2024) for HTN/HLD, Hypothyroid, Seizures, ANXIETY.   LABORATORY TESTING:  - Pap smear: not applicable  IMMUNIZATIONS:   - Tdap: Tetanus vaccination status reviewed: refused. - Influenza: Up To Date - Pneumovax: Up to date - Prevnar: Up to date - HPV: Not applicable - Zostavax vaccine: Refuses to get second one, first made her feel really bad - Covid: Up To Date  SCREENING: -Mammogram: Up to date -- [facility] 10/07/23 - Colonoscopy: Up to date  - Bone Density: Up to date -- [facility] 10/07/23 -Hearing Test: Not  applicable  -Spirometry: Not applicable   PATIENT COUNSELING:   Advised to take 1 mg of folate supplement per day if capable of pregnancy.   Sexuality: Discussed sexually transmitted diseases, partner selection, use of condoms, avoidance of unintended pregnancy  and contraceptive alternatives.   Advised to avoid cigarette smoking.  I discussed with the patient that most people either abstain from alcohol or drink within safe limits (<=14/week and <=4 drinks/occasion for males, <=7/weeks and <= 3 drinks/occasion for females) and that the risk for alcohol disorders and other health effects rises proportionally with the number of drinks per week and how often a drinker exceeds daily limits.  Discussed cessation/primary prevention of drug use and availability of treatment for abuse.   Diet: Encouraged to adjust caloric intake to maintain  or achieve ideal body weight, to reduce intake of dietary saturated fat and total fat, to limit sodium intake by avoiding high sodium foods and not adding table salt, and to maintain adequate dietary potassium and calcium  preferably from fresh fruits, vegetables, and low-fat dairy products.    Stressed the importance of regular exercise  Injury prevention: Discussed safety belts, safety helmets, smoke detector, smoking near bedding or upholstery.   Dental health: Discussed importance of regular tooth brushing, flossing, and dental visits.    NEXT PREVENTATIVE PHYSICAL DUE IN 1 YEAR. Return in about 6 months (around 09/15/2024) for HTN/HLD, Hypothyroid, Seizures, ANXIETY.

## 2024-03-17 NOTE — Assessment & Plan Note (Signed)
Chronic, ongoing.  Continue daily supplement and adjust as needed.  Check B12 level today. 

## 2024-03-17 NOTE — Assessment & Plan Note (Signed)
 Chronic, ongoing.  Continue current medication regimen and adjust as needed.  Saw endocrinology in past, will return to them if needed.  Thyroid  labs today.

## 2024-03-17 NOTE — Assessment & Plan Note (Signed)
Chronic, ongoing.  Continue current medication regimen and adjust as needed.  Lipid panel today.  Return in 6 months. 

## 2024-03-17 NOTE — Assessment & Plan Note (Signed)
 Chronic, stable.  Continue current medication regimen as recommended by neuro and collaboration with them.  CMP today.  Keppra  level next visit.  Recent neuro note reviewed.

## 2024-03-18 ENCOUNTER — Ambulatory Visit: Payer: Self-pay | Admitting: Nurse Practitioner

## 2024-03-18 LAB — CBC WITH DIFFERENTIAL/PLATELET
Basophils Absolute: 0 x10E3/uL (ref 0.0–0.2)
Basos: 1 %
EOS (ABSOLUTE): 0.1 x10E3/uL (ref 0.0–0.4)
Eos: 3 %
Hematocrit: 44.6 % (ref 34.0–46.6)
Hemoglobin: 13.7 g/dL (ref 11.1–15.9)
Immature Grans (Abs): 0 x10E3/uL (ref 0.0–0.1)
Immature Granulocytes: 0 %
Lymphocytes Absolute: 1.5 x10E3/uL (ref 0.7–3.1)
Lymphs: 31 %
MCH: 29.8 pg (ref 26.6–33.0)
MCHC: 30.7 g/dL — ABNORMAL LOW (ref 31.5–35.7)
MCV: 97 fL (ref 79–97)
Monocytes Absolute: 0.6 x10E3/uL (ref 0.1–0.9)
Monocytes: 14 %
Neutrophils Absolute: 2.4 x10E3/uL (ref 1.4–7.0)
Neutrophils: 51 %
Platelets: 260 x10E3/uL (ref 150–450)
RBC: 4.6 x10E6/uL (ref 3.77–5.28)
RDW: 13.6 % (ref 11.7–15.4)
WBC: 4.7 x10E3/uL (ref 3.4–10.8)

## 2024-03-18 LAB — COMPREHENSIVE METABOLIC PANEL WITH GFR
ALT: 14 IU/L (ref 0–32)
AST: 22 IU/L (ref 0–40)
Albumin: 4.1 g/dL (ref 3.8–4.8)
Alkaline Phosphatase: 90 IU/L (ref 49–135)
BUN/Creatinine Ratio: 15 (ref 12–28)
BUN: 17 mg/dL (ref 8–27)
Bilirubin Total: 0.6 mg/dL (ref 0.0–1.2)
CO2: 24 mmol/L (ref 20–29)
Calcium: 9.8 mg/dL (ref 8.7–10.3)
Chloride: 102 mmol/L (ref 96–106)
Creatinine, Ser: 1.1 mg/dL — ABNORMAL HIGH (ref 0.57–1.00)
Globulin, Total: 2.8 g/dL (ref 1.5–4.5)
Glucose: 94 mg/dL (ref 70–99)
Potassium: 3.6 mmol/L (ref 3.5–5.2)
Sodium: 141 mmol/L (ref 134–144)
Total Protein: 6.9 g/dL (ref 6.0–8.5)
eGFR: 52 mL/min/1.73 — ABNORMAL LOW (ref >59)

## 2024-03-18 LAB — VITAMIN B12: Vitamin B-12: 937 pg/mL (ref 232–1245)

## 2024-03-18 LAB — MAGNESIUM: Magnesium: 2 mg/dL (ref 1.6–2.3)

## 2024-03-18 LAB — FERRITIN: Ferritin: 17 ng/mL (ref 15–150)

## 2024-03-18 LAB — LIPID PANEL W/O CHOL/HDL RATIO
Cholesterol, Total: 169 mg/dL (ref 100–199)
HDL: 68 mg/dL (ref >39)
LDL Chol Calc (NIH): 82 mg/dL (ref 0–99)
Triglycerides: 106 mg/dL (ref 0–149)
VLDL Cholesterol Cal: 19 mg/dL (ref 5–40)

## 2024-03-18 LAB — TSH: TSH: 4.16 u[IU]/mL (ref 0.450–4.500)

## 2024-03-18 LAB — VITAMIN D 25 HYDROXY (VIT D DEFICIENCY, FRACTURES): Vit D, 25-Hydroxy: 32.9 ng/mL (ref 30.0–100.0)

## 2024-03-18 LAB — T4, FREE: Free T4: 1.5 ng/dL (ref 0.82–1.77)

## 2024-03-18 LAB — IRON: Iron: 63 ug/dL (ref 27–139)

## 2024-03-18 NOTE — Progress Notes (Signed)
 Contacted via MyChart  Good afternoon Thurma, your labs have returned: - Kidney function, eGFR and creatinine, continues to show Stage 3a kidney disease with no worsening. Liver function, AST and ALT, is normal. - Remainder of labs stable, you may benefit an increase in Atorvastatin  to 80 MG to get LDL closer to stroke prevention goal. Would you like to try this? Let me know. Any questions? Keep being amazing!!  Thank you for allowing me to participate in your care.  I appreciate you. Kindest regards, Wrenna Saks

## 2024-03-19 MED ORDER — ATORVASTATIN CALCIUM 80 MG PO TABS: 80.0000 mg | ORAL_TABLET | Freq: Every day | ORAL | 3 refills | Status: AC

## 2024-05-04 DIAGNOSIS — R569 Unspecified convulsions: Secondary | ICD-10-CM | POA: Diagnosis not present

## 2024-05-04 DIAGNOSIS — I10 Essential (primary) hypertension: Secondary | ICD-10-CM | POA: Diagnosis not present

## 2024-05-04 DIAGNOSIS — Z1331 Encounter for screening for depression: Secondary | ICD-10-CM | POA: Diagnosis not present

## 2024-05-04 DIAGNOSIS — F419 Anxiety disorder, unspecified: Secondary | ICD-10-CM | POA: Diagnosis not present

## 2024-08-12 ENCOUNTER — Ambulatory Visit: Payer: PPO

## 2024-08-19 ENCOUNTER — Ambulatory Visit

## 2024-09-16 ENCOUNTER — Ambulatory Visit: Admitting: Nurse Practitioner
